# Patient Record
Sex: Male | Born: 1970 | Race: Black or African American | Hispanic: No | Marital: Married | Smoking: Never smoker
Health system: Southern US, Community
[De-identification: ages and names within clinical notes are randomized; demographics above are authoritative.]

## PROBLEM LIST (undated history)

## (undated) DIAGNOSIS — M1990 Unspecified osteoarthritis, unspecified site: Secondary | ICD-10-CM

## (undated) DIAGNOSIS — R42 Dizziness and giddiness: Secondary | ICD-10-CM

## (undated) DIAGNOSIS — E119 Type 2 diabetes mellitus without complications: Secondary | ICD-10-CM

## (undated) DIAGNOSIS — I10 Essential (primary) hypertension: Secondary | ICD-10-CM

## (undated) DIAGNOSIS — E1169 Type 2 diabetes mellitus with other specified complication: Secondary | ICD-10-CM

## (undated) DIAGNOSIS — M25519 Pain in unspecified shoulder: Secondary | ICD-10-CM

## (undated) DIAGNOSIS — E785 Hyperlipidemia, unspecified: Secondary | ICD-10-CM

## (undated) HISTORY — DX: Type 2 diabetes mellitus without complications: E11.9

## (undated) HISTORY — PX: LUMBAR DISC SURGERY: SHX700

## (undated) HISTORY — DX: Unspecified osteoarthritis, unspecified site: M19.90

## (undated) HISTORY — PX: EYE SURGERY: SHX253

## (undated) HISTORY — PX: LUMBAR FUSION: SHX111

## (undated) HISTORY — DX: Type 2 diabetes mellitus with other specified complication: E11.69

## (undated) HISTORY — DX: Pain in unspecified shoulder: M25.519

## (undated) HISTORY — DX: Essential (primary) hypertension: I10

## (undated) HISTORY — PX: KELOID EXCISION: SHX1856

## (undated) HISTORY — PX: APPENDECTOMY: SHX54

## (undated) HISTORY — PX: SPINAL CORD STIMULATOR IMPLANT: SHX2422

## (undated) HISTORY — DX: Type 2 diabetes mellitus with other specified complication: E78.5

## (undated) HISTORY — PX: KNEE ARTHROSCOPY: SUR90

---

## 2013-12-15 DIAGNOSIS — R059 Cough, unspecified: Secondary | ICD-10-CM | POA: Insufficient documentation

## 2014-04-08 DIAGNOSIS — M4316 Spondylolisthesis, lumbar region: Secondary | ICD-10-CM | POA: Insufficient documentation

## 2014-04-08 DIAGNOSIS — G894 Chronic pain syndrome: Secondary | ICD-10-CM | POA: Insufficient documentation

## 2017-03-01 LAB — BASIC METABOLIC PANEL
BUN: 20 (ref 4–21)
CO2: 18 (ref 13–22)
Chloride: 99 (ref 99–108)
Creatinine: 1.3 (ref 0.6–1.3)
Glucose: 104
Potassium: 4.8 (ref 3.4–5.3)
Sodium: 138 (ref 137–147)

## 2017-03-01 LAB — COMPREHENSIVE METABOLIC PANEL
Albumin: 4.9 (ref 3.5–5.0)
Calcium: 10.3 (ref 8.7–10.7)
GFR calc Af Amer: 75
GFR calc non Af Amer: 65
Globulin: 3

## 2017-03-01 LAB — CBC AND DIFFERENTIAL
HCT: 53 (ref 41–53)
Hemoglobin: 17.9 — AB (ref 13.5–17.5)
Platelets: 287 (ref 150–399)
WBC: 11.2

## 2017-03-01 LAB — TSH: TSH: 0.66 (ref 0.41–5.90)

## 2017-03-01 LAB — MICROALBUMIN, URINE: Microalb, Ur: <3

## 2017-03-01 LAB — HEPATIC FUNCTION PANEL
ALT: 25 (ref 10–40)
AST: 28 (ref 14–40)
Alkaline Phosphatase: 114 (ref 25–125)
Bilirubin, Total: 0.6

## 2017-03-01 LAB — CBC: RBC: 6.36 — AB (ref 3.87–5.11)

## 2018-11-28 LAB — HEPATIC FUNCTION PANEL
ALT: 28 (ref 10–40)
AST: 41 — AB (ref 14–40)
Alkaline Phosphatase: 96 (ref 25–125)

## 2018-11-28 LAB — COMPREHENSIVE METABOLIC PANEL
Albumin: 5.3 — AB (ref 3.5–5.0)
Calcium: 10.6 (ref 8.7–10.7)
GFR calc Af Amer: 67
GFR calc non Af Amer: 58
Globulin: 2.7

## 2018-11-28 LAB — BASIC METABOLIC PANEL
BUN: 21 (ref 4–21)
CO2: 19 (ref 13–22)
Chloride: 101 (ref 99–108)
Creatinine: 1.4 — AB (ref 0.6–1.3)
Glucose: 75
Potassium: 4.9 (ref 3.4–5.3)
Sodium: 138 (ref 137–147)

## 2018-11-28 LAB — LIPID PANEL
Cholesterol: 119 (ref 0–200)
HDL: 36 (ref 35–70)
LDL Cholesterol: 71
Triglycerides: 60 (ref 40–160)

## 2018-11-28 LAB — HEMOGLOBIN A1C: Hemoglobin A1C: 7.7

## 2019-02-04 NOTE — Progress Notes (Signed)
Patient: Timothy Douglas, Male    DOB: 1970-07-29, 49 y.o.   MRN: [id number] Visit Date: 02/05/2019  Today's Provider: Lavon Paganini, MD   Chief Complaint  Patient presents with  . New Patient (Initial Visit)   Subjective:    I Timothy Douglas, CMA, am acting as scribe for Lavon Paganini, MD.   Virtual Visit via Telephone Note  I connected with Timothy Douglas on 02/05/19 at  2:00 PM EST by telephone and verified that I am speaking with the correct person using two identifiers.  Location: Patient location: home Provider location: home office  Persons involved in the visit: patient, provider   I discussed the limitations, risks, security and privacy concerns of performing an evaluation and management service by telephone and the availability of in person appointments. I also discussed with the patient that there may be a patient responsible charge related to this service. The patient expressed understanding and agreed to proceed.   Establish Care Timothy Douglas is a 49 y.o. male who presents today to establish care.  Concerned that some of the medications that he has been taking for BP and T2DM have been "throwing the rest of my body off."  Reports that his sex drive is low and attributes this to the medication.  Within the last 6-7 months, having more struggle with ED.  Has difficulty getting and maintaining an erection. Does awaken with an erection at times.  Former PCP in [place] told him to wait and see. He wants to make sure that he is well listened to.  He also wants to make sure that his HCM is kept up-to-date.  He has never had a colonoscopy.  Also would love a "wonder drug" to stop joints from aching.  Thinks that age has contributed to aches and pains.  Was involved in a work related accident in his 34s.  He broke his back in 3 places.  He needed multiple surgeries.  Believes that not being able to stay active as he used to contributed to developing these other  medical problems.  Has spinal cord stimulator, but does not need it as much now that he has lost weight and is exercising again.  Taking meloxicam for L shoulder pain. Helps some, but felt like muscle relaxers helped more in the past.  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  Does still get occasional dizziness thought to be related to his former back injury. Takes meclizine rarely  HTN: - Medications: amlodipine, lisinopril - Compliance: good - Checking BP at home: not currently - Denies any SOB, CP, vision changes, LE edema, medication SEs, or symptoms of hypotension - Diet: low sodium - Exercise: Regularly  T2DM - Checking BG at home: No - Medications: Jardiance, janumet, glipizide - Compliance: good - Diet: Low-carb - Exercise: Regular - eye exam: Believes this was him within the last year-we will send ROI - foot exam: Needs at next in-person visit - microalbumin: On ACE inhibitor - denies symptoms of hypoglycemia, polyuria, polydipsia, numbness extremities, foot ulcers/trauma   HLD - medications: atorvastatin - compliance: good - medication SEs: none   Has gout flares intermittently.  Takes indomethacin prn  Knows not to take with other NSAIDs -----------------------------------------------------------------   Review of Systems  Constitutional: Negative.   HENT: Negative.   Eyes: Negative.   Respiratory: Negative.   Cardiovascular: Negative.   Gastrointestinal: Negative.   Endocrine: Negative.   Genitourinary: Negative.   Musculoskeletal: Negative.   Skin: Negative.  Allergic/Immunologic: Negative.   Neurological: Negative.   Hematological: Negative.   Psychiatric/Behavioral: Negative.     Social History      He  reports that he has never smoked. He has never used smokeless tobacco. He reports current alcohol use of about 2.0 - 3.0 standard drinks of alcohol per week. He reports that he does not use drugs.       Social History   Socioeconomic  History  . Marital status: Married    Spouse name: Timothy Douglas  . Number of children: 3  . Years of education: Not on file  . Highest education level: Not on file  Occupational History  . Not on file  Tobacco Use  . Smoking status: Never Smoker  . Smokeless tobacco: Never Used  Substance and Sexual Activity  . Alcohol use: Yes    Alcohol/week: 2.0 - 3.0 standard drinks    Types: 1 - 2 Glasses of wine, 1 Cans of beer per week  . Drug use: Never  . Sexual activity: Yes    Partners: Female    Birth control/protection: None  Other Topics Concern  . Not on file  Social History Narrative  . Not on file   Social Determinants of Health   Financial Resource Strain:   . Difficulty of Paying Living Expenses: Not on file  Food Insecurity:   . Worried About Charity fundraiser in the Last Year: Not on file  . Ran Out of Food in the Last Year: Not on file  Transportation Needs:   . Lack of Transportation (Medical): Not on file  . Lack of Transportation (Non-Medical): Not on file  Physical Activity:   . Days of Exercise per Week: Not on file  . Minutes of Exercise per Session: Not on file  Stress:   . Feeling of Stress : Not on file  Social Connections:   . Frequency of Communication with Friends and Family: Not on file  . Frequency of Social Gatherings with Friends and Family: Not on file  . Attends Religious Services: Not on file  . Active Member of Clubs or Organizations: Not on file  . Attends Archivist Meetings: Not on file  . Marital Status: Not on file    Past Medical History:  Diagnosis Date  . DJD (degenerative joint disease)   . Essential hypertension   . Hyperlipidemia due to type 2 diabetes mellitus ([place])   . Shoulder pain   . T2DM (type 2 diabetes mellitus) [facility])      Patient Active Problem List   Diagnosis Date Noted  . Family history of thyroid disease 02/05/2019  . Family history of leukemia 02/05/2019  . Decreased libido 02/05/2019  .  Erectile dysfunction 02/05/2019  . T2DM (type 2 diabetes mellitus) ([facility])   . Essential hypertension   . Hyperlipidemia due to type 2 diabetes mellitus ([place])   . Spondylolisthesis of lumbar region 04/08/2014    Past Surgical History:  Procedure Laterality Date  . APPENDECTOMY    . EYE SURGERY    . KELOID EXCISION  1990s  . KNEE ARTHROSCOPY Left   . LUMBAR DISC SURGERY    . LUMBAR FUSION     L5-S1  . SPINAL CORD STIMULATOR IMPLANT      Family History        Family Status  Relation Name Status  . Mother  (Not Specified)  . Father  (Not Specified)  . Sister  (Not Specified)  . Brother  (Not Specified)  .  MGM  (Not Specified)  . MGF  (Not Specified)  . PGM  (Not Specified)  . Mat Uncle  (Not Specified)  . Paternal GGF  (Not Specified)  . Neg Hx  (Not Specified)        His family history includes Bell's palsy in his mother; Congestive Heart Failure in his paternal great-grandfather; Diabetes in his maternal grandmother and paternal grandmother; Hypertension in his father and mother; Leukemia in his maternal grandfather and maternal uncle; Thyroid disease in his brother and sister. There is no history of Breast cancer, Prostate cancer, or Colon cancer.      Allergies  Allergen Reactions  . Tape      Current Outpatient Medications:  .  amLODipine (NORVASC) 10 MG tablet, Take 10 mg by mouth Timothy., Disp: , Rfl:  .  atorvastatin (LIPITOR) 40 MG tablet, Take 40 mg by mouth Timothy., Disp: , Rfl:  .  glipiZIDE (GLUCOTROL XL) 5 MG 24 hr tablet, Take 5 mg by mouth Timothy., Disp: , Rfl:  .  indomethacin (INDOCIN) 25 MG capsule, Take 25 mg by mouth 3 (three) times Timothy as needed., Disp: , Rfl:  .  JANUMET XR 50-1000 MG TB24, Take 2 tablets by mouth Timothy., Disp: , Rfl:  .  JARDIANCE 25 MG TABS tablet, Take 25 mg by mouth Timothy., Disp: , Rfl:  .  lisinopril (ZESTRIL) 40 MG tablet, Take 40 mg by mouth Timothy., Disp: , Rfl:  .  meclizine (ANTIVERT) 25 MG tablet, Take 25 mg by mouth every  6 (six) hours as needed., Disp: , Rfl:  .  meloxicam (MOBIC) 15 MG tablet, Take 15 mg by mouth Timothy., Disp: , Rfl:  .  sildenafil (VIAGRA) 100 MG tablet, Take 0.5-1 tablets (50-100 mg total) by mouth Timothy as needed for erectile dysfunction., Disp: 15 tablet, Rfl: 11   Patient Care Team: Virginia Crews, MD as PCP - General (Family Medicine)    Objective:    Vitals: There were no vitals taken for this visit.  There were no vitals filed for this visit.   Physical Exam Speaking in full sentences in NAD  Depression Screen No flowsheet data found.     Assessment & Plan:     Establish Care  Exercise Activities and Dietary recommendations Goals   None     Immunization History  Administered Date(s) Administered  . Influenza,inj,Quad PF,6+ Mos 11/02/2018    Health Maintenance  Topic Date Due  . HEMOGLOBIN A1C  1970-05-07  . PNEUMOCOCCAL POLYSACCHARIDE VACCINE AGE 55-64 HIGH RISK  02/24/1970  . FOOT EXAM  02/24/1978  . OPHTHALMOLOGY EXAM  02/24/1978  . HIV Screening  02/25/1983  . TETANUS/TDAP  02/25/1987  . COLONOSCOPY  02/24/2018  . INFLUENZA VACCINE  Completed     Discussed health benefits of physical activity, and encouraged him to engage in regular exercise appropriate for his age and condition.    --------------------------------------------------------------------  Problem List Items Addressed This Visit      Cardiovascular and Mediastinum   Essential hypertension    Reportedly previously well controlled Continue current medications Check metabolic panel Discussed low-sodium diet and exercise Follow-up at CPE in 3 months      Relevant Medications   amLODipine (NORVASC) 10 MG tablet   atorvastatin (LIPITOR) 40 MG tablet   lisinopril (ZESTRIL) 40 MG tablet   sildenafil (VIAGRA) 100 MG tablet   Other Relevant Orders   CMP (Comprehensive metabolic panel)     Endocrine   T2DM (type 2 diabetes mellitus) (  [place]) - Primary    Previous control  unknown Associated with/complicated by HTN and HLD We will check A1c Continue Janumet, glipizide, Jardiance at current doses We discussed that we could consider tapering back on medications pending his A1c, but that it is common for people need multiple diabetic medications We discussed low-carb diet and exercise Needs foot exam at next in-person visit We will send ROI to previous PCP for last vaccinations We will send ROI to ophthalmology for last diabetic eye exam On statin and ACE inhibitor Follow-up in 3 months      Relevant Medications   JANUMET XR 50-1000 MG TB24   atorvastatin (LIPITOR) 40 MG tablet   JARDIANCE 25 MG TABS tablet   glipiZIDE (GLUCOTROL XL) 5 MG 24 hr tablet   lisinopril (ZESTRIL) 40 MG tablet   Other Relevant Orders   Hemoglobin A1c   Hyperlipidemia due to type 2 diabetes mellitus (H[facility])    Chronic Previous control unknown Check FLP and CMP Continue atorvastatin Goal LDL less than 70 ROI to previous PCP      Relevant Medications   JANUMET XR 50-1000 MG TB24   amLODipine (NORVASC) 10 MG tablet   atorvastatin (LIPITOR) 40 MG tablet   JARDIANCE 25 MG TABS tablet   glipiZIDE (GLUCOTROL XL) 5 MG 24 hr tablet   lisinopril (ZESTRIL) 40 MG tablet   sildenafil (VIAGRA) 100 MG tablet   Other Relevant Orders   Lipid panel   CMP (Comprehensive metabolic panel)     Musculoskeletal and Integument   Spondylolisthesis of lumbar region    Status post multiple back surgeries with recurrent back pain Continue meloxicam as needed Could consider muscle relaxer as needed Also has spinal stimulator in place        Other   Family history of thyroid disease    Patient reports that both his brother and sister have thyroid disease He is asymptomatic Screening TSH      Relevant Orders   TSH   Family history of leukemia    Patient has 2 members of his maternal family with history of leukemia Check annual CBC      Relevant Orders   CBC w/Diff/Platelet    Decreased libido    Longstanding issue He believes it may be related to some of his diabetes and hypertension medications We discussed that ED is common with diabetes and hypertension as these are all vascular issues It is unlikely that any one of his medications is directly causing this We did discuss that we will try to minimize medications as able depending on control that we find from labs and blood pressure at next visit Trial of Viagra as needed      Erectile dysfunction    Longstanding issue He believes it may be related to some of his diabetes and hypertension medications We discussed that ED is common with diabetes and hypertension as these are all vascular issues It is unlikely that any one of his medications is directly causing this We did discuss that we will try to minimize medications as able depending on control that we find from labs and blood pressure at next visit Trial of Viagra as needed       Other Visit Diagnoses    Screen for colon cancer       Relevant Orders   Ambulatory referral to Gastroenterology       Return in about 3 months (around 05/06/2019) for CPE.  Total time spent on today's visit was greater than 50  minutes, including both face-to-face time and nonface-to-face time personally spent on review of chart (labs and imaging), discussing labs and goals, discussing further work-up, treatment options, referrals to specialist if needed, reviewing outside records of pertinent, answering patient's questions, and coordinating care.    The entirety of the information documented in the History of Present Illness, Review of Systems and Physical Exam were personally obtained by me. Portions of this information were initially documented by Lynford Humphrey, CMA and reviewed by me for thoroughness and accuracy.    Naama Sappington, Dionne Bucy, MD MPH [facility]

## 2019-02-05 ENCOUNTER — Encounter: Payer: Self-pay | Admitting: Family Medicine

## 2019-02-05 ENCOUNTER — Ambulatory Visit (INDEPENDENT_AMBULATORY_CARE_PROVIDER_SITE_OTHER): Payer: 59 | Admitting: Family Medicine

## 2019-02-05 DIAGNOSIS — E1169 Type 2 diabetes mellitus with other specified complication: Secondary | ICD-10-CM | POA: Diagnosis not present

## 2019-02-05 DIAGNOSIS — M4316 Spondylolisthesis, lumbar region: Secondary | ICD-10-CM

## 2019-02-05 DIAGNOSIS — I10 Essential (primary) hypertension: Secondary | ICD-10-CM

## 2019-02-05 DIAGNOSIS — I152 Hypertension secondary to endocrine disorders: Secondary | ICD-10-CM | POA: Insufficient documentation

## 2019-02-05 DIAGNOSIS — Z8349 Family history of other endocrine, nutritional and metabolic diseases: Secondary | ICD-10-CM | POA: Diagnosis not present

## 2019-02-05 DIAGNOSIS — Z806 Family history of leukemia: Secondary | ICD-10-CM

## 2019-02-05 DIAGNOSIS — Z1211 Encounter for screening for malignant neoplasm of colon: Secondary | ICD-10-CM

## 2019-02-05 DIAGNOSIS — E785 Hyperlipidemia, unspecified: Secondary | ICD-10-CM | POA: Insufficient documentation

## 2019-02-05 DIAGNOSIS — E1159 Type 2 diabetes mellitus with other circulatory complications: Secondary | ICD-10-CM | POA: Insufficient documentation

## 2019-02-05 DIAGNOSIS — N529 Male erectile dysfunction, unspecified: Secondary | ICD-10-CM

## 2019-02-05 DIAGNOSIS — E119 Type 2 diabetes mellitus without complications: Secondary | ICD-10-CM | POA: Insufficient documentation

## 2019-02-05 DIAGNOSIS — R6882 Decreased libido: Secondary | ICD-10-CM | POA: Insufficient documentation

## 2019-02-05 MED ORDER — SILDENAFIL CITRATE 100 MG PO TABS: 50.0000 mg | ORAL_TABLET | Freq: Every day | ORAL | 11 refills | Status: DC | PRN

## 2019-02-05 NOTE — Assessment & Plan Note (Signed)
Status post multiple back surgeries with recurrent back pain Continue meloxicam as needed Could consider muscle relaxer as needed Also has spinal stimulator in place

## 2019-02-05 NOTE — Assessment & Plan Note (Signed)
Chronic Previous control unknown Check FLP and CMP Continue atorvastatin Goal LDL less than 70 ROI to previous PCP

## 2019-02-05 NOTE — Assessment & Plan Note (Signed)
Previous control unknown Associated with/complicated by HTN and HLD We will check A1c Continue Janumet, glipizide, Jardiance at current doses We discussed that we could consider tapering back on medications pending his A1c, but that it is common for people need multiple diabetic medications We discussed low-carb diet and exercise Needs foot exam at next in-person visit We will send ROI to previous PCP for last vaccinations We will send ROI to ophthalmology for last diabetic eye exam On statin and ACE inhibitor Follow-up in 3 months

## 2019-02-05 NOTE — Assessment & Plan Note (Signed)
Patient has 2 members of his maternal family with history of leukemia Check annual CBC

## 2019-02-05 NOTE — Assessment & Plan Note (Signed)
Longstanding issue He believes it may be related to some of his diabetes and hypertension medications We discussed that ED is common with diabetes and hypertension as these are all vascular issues It is unlikely that any one of his medications is directly causing this We did discuss that we will try to minimize medications as able depending on control that we find from labs and blood pressure at next visit Trial of Viagra as needed

## 2019-02-05 NOTE — Assessment & Plan Note (Signed)
Reportedly previously well controlled Continue current medications Check metabolic panel Discussed low-sodium diet and exercise Follow-up at CPE in 3 months

## 2019-02-05 NOTE — Assessment & Plan Note (Signed)
Patient reports that both his brother and sister have thyroid disease He is asymptomatic Screening TSH

## 2019-02-15 ENCOUNTER — Encounter: Payer: Self-pay | Admitting: *Deleted

## 2019-02-17 ENCOUNTER — Other Ambulatory Visit: Payer: Self-pay

## 2019-02-17 ENCOUNTER — Telehealth: Payer: Self-pay

## 2019-02-17 DIAGNOSIS — Z1211 Encounter for screening for malignant neoplasm of colon: Secondary | ICD-10-CM

## 2019-02-17 NOTE — Telephone Encounter (Signed)
Gastroenterology Pre-Procedure Review  Request Date: Friday 03/19/19 Requesting Physician: Dr. Allen Norris  PATIENT REVIEW QUESTIONS: The patient responded to the following health history questions as indicated:    1. Are you having any GI issues? no 2. Do you have a personal history of Polyps? no 3. Do you have a family history of Colon Cancer or Polyps? no 4. Diabetes Mellitus?yes 5. Joint replacements in the past 12 months?no 6. Major health problems in the past 3 months?no 7. Any artificial heart valves, MVP, or defibrillator?no    MEDICATIONS & ALLERGIES:    Patient reports the following regarding taking any anticoagulation/antiplatelet therapy:   Plavix, Coumadin, Eliquis, Xarelto, Lovenox, Pradaxa, Brilinta, or Effient? no Aspirin? no  Patient confirms/reports the following medications:  Current Outpatient Medications  Medication Sig Dispense Refill  . amLODipine (NORVASC) 10 MG tablet Take 10 mg by mouth daily.    Marland Kitchen atorvastatin (LIPITOR) 40 MG tablet Take 40 mg by mouth daily.    Marland Kitchen glipiZIDE (GLUCOTROL XL) 5 MG 24 hr tablet Take 5 mg by mouth daily.    . indomethacin (INDOCIN) 25 MG capsule Take 25 mg by mouth 3 (three) times daily as needed.    Marland Kitchen JANUMET XR 50-1000 MG TB24 Take 2 tablets by mouth daily.    Marland Kitchen JARDIANCE 25 MG TABS tablet Take 25 mg by mouth daily.    Marland Kitchen lisinopril (ZESTRIL) 40 MG tablet Take 40 mg by mouth daily.    . meclizine (ANTIVERT) 25 MG tablet Take 25 mg by mouth every 6 (six) hours as needed.    . meloxicam (MOBIC) 15 MG tablet Take 15 mg by mouth daily.    . sildenafil (VIAGRA) 100 MG tablet Take 0.5-1 tablets (50-100 mg total) by mouth daily as needed for erectile dysfunction. 15 tablet 11   No current facility-administered medications for this visit.    Patient confirms/reports the following allergies:  Allergies  Allergen Reactions  . Tape     No orders of the defined types were placed in this encounter.   AUTHORIZATION INFORMATION Primary  Insurance: 1D#: Group #:  Secondary Insurance: 1D#: Group #:  SCHEDULE INFORMATION: Date: 03/19/19 Time: Location:[facility]

## 2019-03-15 ENCOUNTER — Encounter: Payer: Self-pay | Admitting: Gastroenterology

## 2019-03-15 ENCOUNTER — Other Ambulatory Visit: Payer: Self-pay

## 2019-03-17 ENCOUNTER — Other Ambulatory Visit: Payer: Self-pay

## 2019-03-17 ENCOUNTER — Other Ambulatory Visit
Admission: RE | Admit: 2019-03-17 | Discharge: 2019-03-17 | Disposition: A | Discharge status: Home / Self Care | Payer: 59 | Source: Ambulatory Visit | Attending: Gastroenterology | Admitting: Gastroenterology

## 2019-03-17 DIAGNOSIS — Z20822 Contact with and (suspected) exposure to covid-19: Secondary | ICD-10-CM | POA: Diagnosis not present

## 2019-03-17 DIAGNOSIS — Z01812 Encounter for preprocedural laboratory examination: Secondary | ICD-10-CM | POA: Diagnosis not present

## 2019-03-17 LAB — SARS CORONAVIRUS 2 (TAT 6-24 HRS): SARS Coronavirus 2: NEGATIVE

## 2019-03-17 NOTE — Discharge Instructions (Signed)
General Anesthesia, Adult, Care After This sheet gives you information about how to care for yourself after your procedure. Your health care provider may also give you more specific instructions. If you have problems or questions, contact your health care provider. What can I expect after the procedure? After the procedure, the following side effects are common:  Pain or discomfort at the IV site.  Nausea.  Vomiting.  Sore throat.  Trouble concentrating.  Feeling cold or chills.  Weak or tired.  Sleepiness and fatigue.  Soreness and body aches. These side effects can affect parts of the body that were not involved in surgery. Follow these instructions at home:  For at least 24 hours after the procedure:  Have a responsible adult stay with you. It is important to have someone help care for you until you are awake and alert.  Rest as needed.  Do not: ? Participate in activities in which you could fall or become injured. ? Drive. ? Use heavy machinery. ? Drink alcohol. ? Take sleeping pills or medicines that cause drowsiness. ? Make important decisions or sign legal documents. ? Take care of children on your own. Eating and drinking  Follow any instructions from your health care provider about eating or drinking restrictions.  When you feel hungry, start by eating small amounts of foods that are soft and easy to digest (bland), such as toast. Gradually return to your regular diet.  Drink enough fluid to keep your urine pale yellow.  If you vomit, rehydrate by drinking water, juice, or clear broth. General instructions  If you have sleep apnea, surgery and certain medicines can increase your risk for breathing problems. Follow instructions from your health care provider about wearing your sleep device: ? Anytime you are sleeping, including during daytime naps. ? While taking prescription pain medicines, sleeping medicines, or medicines that make you drowsy.  Return to  your normal activities as told by your health care provider. Ask your health care provider what activities are safe for you.  Take over-the-counter and prescription medicines only as told by your health care provider.  If you smoke, do not smoke without supervision.  Keep all follow-up visits as told by your health care provider. This is important. Contact a health care provider if:  You have nausea or vomiting that does not get better with medicine.  You cannot eat or drink without vomiting.  You have pain that does not get better with medicine.  You are unable to pass urine.  You develop a skin rash.  You have a fever.  You have redness around your IV site that gets worse. Get help right away if:  You have difficulty breathing.  You have chest pain.  You have blood in your urine or stool, or you vomit blood. Summary  After the procedure, it is common to have a sore throat or nausea. It is also common to feel tired.  Have a responsible adult stay with you for the first 24 hours after general anesthesia. It is important to have someone help care for you until you are awake and alert.  When you feel hungry, start by eating small amounts of foods that are soft and easy to digest (bland), such as toast. Gradually return to your regular diet.  Drink enough fluid to keep your urine pale yellow.  Return to your normal activities as told by your health care provider. Ask your health care provider what activities are safe for you. This information is not   intended to replace advice given to you by your health care provider. Make sure you discuss any questions you have with your health care provider. Document Revised: 01/17/2017 Document Reviewed: 08/30/2016 Elsevier Patient Education  2020 [employer]  

## 2019-03-19 ENCOUNTER — Ambulatory Visit: Payer: No Typology Code available for payment source | Admitting: Anesthesiology

## 2019-03-19 ENCOUNTER — Other Ambulatory Visit: Payer: Self-pay

## 2019-03-19 ENCOUNTER — Encounter
Admission: RE | Disposition: A | Discharge status: Home / Self Care | Payer: Self-pay | Source: Home / Self Care | Attending: Gastroenterology

## 2019-03-19 ENCOUNTER — Encounter: Payer: Self-pay | Admitting: Gastroenterology

## 2019-03-19 ENCOUNTER — Ambulatory Visit
Admission: RE | Admit: 2019-03-19 | Discharge: 2019-03-19 | Disposition: A | Discharge status: Home / Self Care | Payer: No Typology Code available for payment source | Attending: Gastroenterology | Admitting: Gastroenterology

## 2019-03-19 DIAGNOSIS — K635 Polyp of colon: Secondary | ICD-10-CM | POA: Diagnosis not present

## 2019-03-19 DIAGNOSIS — Z791 Long term (current) use of non-steroidal anti-inflammatories (NSAID): Secondary | ICD-10-CM | POA: Diagnosis not present

## 2019-03-19 DIAGNOSIS — Z79899 Other long term (current) drug therapy: Secondary | ICD-10-CM | POA: Diagnosis not present

## 2019-03-19 DIAGNOSIS — E1169 Type 2 diabetes mellitus with other specified complication: Secondary | ICD-10-CM | POA: Insufficient documentation

## 2019-03-19 DIAGNOSIS — Z7984 Long term (current) use of oral hypoglycemic drugs: Secondary | ICD-10-CM | POA: Insufficient documentation

## 2019-03-19 DIAGNOSIS — I10 Essential (primary) hypertension: Secondary | ICD-10-CM | POA: Insufficient documentation

## 2019-03-19 DIAGNOSIS — K6389 Other specified diseases of intestine: Secondary | ICD-10-CM | POA: Insufficient documentation

## 2019-03-19 DIAGNOSIS — E785 Hyperlipidemia, unspecified: Secondary | ICD-10-CM | POA: Insufficient documentation

## 2019-03-19 DIAGNOSIS — D123 Benign neoplasm of transverse colon: Secondary | ICD-10-CM | POA: Insufficient documentation

## 2019-03-19 DIAGNOSIS — Z1211 Encounter for screening for malignant neoplasm of colon: Secondary | ICD-10-CM | POA: Diagnosis present

## 2019-03-19 HISTORY — DX: Dizziness and giddiness: R42

## 2019-03-19 HISTORY — PX: POLYPECTOMY: SHX5525

## 2019-03-19 HISTORY — PX: COLONOSCOPY WITH PROPOFOL: SHX5780

## 2019-03-19 LAB — GLUCOSE, CAPILLARY: Glucose-Capillary: 124 mg/dL — ABNORMAL HIGH (ref 70–99)

## 2019-03-19 SURGERY — COLONOSCOPY WITH PROPOFOL
Anesthesia: General | Site: Rectum

## 2019-03-19 MED ORDER — STERILE WATER FOR IRRIGATION IR SOLN
Status: DC | PRN
  Administered 2019-03-19: 100 mL

## 2019-03-19 MED ORDER — LACTATED RINGERS IV SOLN
100.0000 mL/h | INTRAVENOUS | Status: DC
  Administered 2019-03-19: 100 mL/h via INTRAVENOUS

## 2019-03-19 MED ORDER — ACETAMINOPHEN 10 MG/ML IV SOLN: 1000.0000 mg | Freq: Once | INTRAVENOUS | Status: DC | PRN

## 2019-03-19 MED ORDER — LIDOCAINE HCL (CARDIAC) PF 100 MG/5ML IV SOSY
PREFILLED_SYRINGE | INTRAVENOUS | Status: DC | PRN
  Administered 2019-03-19: 50 mg via INTRAVENOUS

## 2019-03-19 MED ORDER — PROPOFOL 10 MG/ML IV BOLUS
INTRAVENOUS | Status: DC | PRN
  Administered 2019-03-19: 100 mg via INTRAVENOUS
  Administered 2019-03-19 (×3): 50 mg via INTRAVENOUS

## 2019-03-19 MED ORDER — ONDANSETRON HCL 4 MG/2ML IJ SOLN: 4.0000 mg | Freq: Once | INTRAMUSCULAR | Status: DC | PRN

## 2019-03-19 SURGICAL SUPPLY — 8 items
CANISTER SUCT 1200ML W/VALVE (MISCELLANEOUS) ×2 IMPLANT
FORCEPS BIOP RAD 4 LRG CAP 4 (CUTTING FORCEPS) ×2 IMPLANT
GOWN CVR UNV OPN BCK APRN NK (MISCELLANEOUS) ×2 IMPLANT
GOWN ISOL THUMB LOOP REG UNIV (MISCELLANEOUS) ×2
KIT ENDO PROCEDURE OLY (KITS) ×2 IMPLANT
SNARE SHORT THROW 13M SML OVAL (MISCELLANEOUS) ×2 IMPLANT
TRAP ETRAP POLY (MISCELLANEOUS) ×2 IMPLANT
WATER STERILE IRR 250ML POUR (IV SOLUTION) ×2 IMPLANT

## 2019-03-19 NOTE — Transfer of Care (Signed)
Immediate Anesthesia Transfer of Care Note  Patient: Timothy Douglas  Procedure(s) Performed: COLONOSCOPY WITH BIOPSY (N/A Rectum) POLYPECTOMY (N/A Rectum)  Patient Location: PACU  Anesthesia Type: General  Level of Consciousness: awake, alert  and patient cooperative  Airway and Oxygen Therapy: Patient Spontanous Breathing and Patient connected to supplemental oxygen  Post-op Assessment: Post-op Vital signs reviewed, Patient's Cardiovascular Status Stable, Respiratory Function Stable, Patent Airway and No signs of Nausea or vomiting  Post-op Vital Signs: Reviewed and stable  Complications: No apparent anesthesia complications

## 2019-03-19 NOTE — Anesthesia Procedure Notes (Signed)
Procedure Name: General with mask airway Date/Time: 03/19/2019 11:05 AM Performed by: Jeannene Patella, CRNA Pre-anesthesia Checklist: Patient identified, Emergency Drugs available, Suction available, Patient being monitored and Timeout performed Patient Re-evaluated:Patient Re-evaluated prior to induction Oxygen Delivery Method: Nasal cannula

## 2019-03-19 NOTE — Op Note (Signed)
[facility] Patient Name: Timothy Douglas Procedure Date: 03/19/2019 10:55 AM MRN: [id number] Account #: [id number] Date of Birth: 22-Jun-1970 Admit Type: Outpatient Age: 49 Room: [facility] OR ROOM 01 Gender: Male Note Status: Finalized Procedure:             Colonoscopy Indications:           Screening for colorectal malignant neoplasm Providers:             Lucilla Lame MD, MD Referring MD:          [name] (Referring MD) Medicines:             Propofol per Anesthesia Complications:         No immediate complications. Procedure:             Pre-Anesthesia Assessment:                        - Prior to the procedure, a History and Physical was                         performed, and patient medications and allergies were                         reviewed. The patient's tolerance of previous                         anesthesia was also reviewed. The risks and benefits                         of the procedure and the sedation options and risks                         were discussed with the patient. All questions were                         answered, and informed consent was obtained. Prior                         Anticoagulants: The patient has taken no previous                         anticoagulant or antiplatelet agents. ASA Grade                         Assessment: II - A patient with mild systemic disease.                         After reviewing the risks and benefits, the patient                         was deemed in satisfactory condition to undergo the                         procedure.                        After obtaining informed consent, the colonoscope was  passed under direct vision. Throughout the procedure,                         the patient's blood pressure, pulse, and oxygen                         saturations were monitored continuously. The was                         introduced through the anus and  advanced to the the                         cecum, identified by appendiceal orifice and ileocecal                         valve. The colonoscopy was performed without                         difficulty. The patient tolerated the procedure well.                         The quality of the bowel preparation was fair. Findings:      The perianal and digital rectal examinations were normal.      Three sessile polyps were found in the transverse colon. The polyps were       7 to 15 mm in size. These polyps were removed with a cold snare.       Resection and retrieval were complete.      A localized area of mildly nodular mucosa was found in the transverse       colon. Biopsies were taken with a cold forceps for histology. Impression:            - Preparation of the colon was fair.                        - Three 7 to 15 mm polyps in the transverse colon,                         removed with a cold snare. Resected and retrieved.                        - Nodular mucosa in the transverse colon. Biopsied. Recommendation:        - Discharge patient to home.                        - Resume previous diet.                        - Continue present medications.                        - Await pathology results.                        - Repeat colonoscopy in 3 years if polyp adenoma and                         10 years if hyperplastic Procedure Code(s):     --- Professional ---  45385, Colonoscopy, flexible; with removal of                         tumor(s), polyp(s), or other lesion(s) by snare                         technique                        45380, 59, Colonoscopy, flexible; with biopsy, single                         or multiple Diagnosis Code(s):     --- Professional ---                        Z12.11, Encounter for screening for malignant neoplasm                         of colon                        K63.5, Polyp of colon CPT copyright 2019 American Medical  Association. All rights reserved. The codes documented in this report are preliminary and upon coder review may  be revised to meet current compliance requirements. Lucilla Lame MD, MD 03/19/2019 11:21:58 AM This report has been signed electronically. Number of Addenda: 0 Note Initiated On: 03/19/2019 10:55 AM Scope Withdrawal Time: 0 hours 11 minutes 23 seconds  Total Procedure Duration: 0 hours 18 minutes 4 seconds  Estimated Blood Loss:  Estimated blood loss: none.      [facility]

## 2019-03-19 NOTE — H&P (Signed)
Lucilla Lame, MD [address] [place], [place] [address] Phone: [phone] Fax : [phone]  Primary Care Physician:  Virginia Crews, MD Primary Gastroenterologist:  Dr. Allen Norris  Pre-Procedure History & Physical: HPI:  Timothy Douglas is a 49 y.o. male is here for a screening colonoscopy.   Past Medical History:  Diagnosis Date  . DJD (degenerative joint disease)   . Essential hypertension   . Hyperlipidemia due to type 2 diabetes mellitus ([place])   . Shoulder pain   . T2DM (type 2 diabetes mellitus) ([place])   . Vertigo    1 episode over 10 yrs ago    Past Surgical History:  Procedure Laterality Date  . APPENDECTOMY    . EYE SURGERY    . KELOID EXCISION  1990s  . KNEE ARTHROSCOPY Left   . LUMBAR DISC SURGERY    . LUMBAR FUSION     L5-S1  . SPINAL CORD STIMULATOR IMPLANT      Prior to Admission medications   Medication Sig Start Date End Date Taking? Authorizing Provider  amLODipine (NORVASC) 10 MG tablet Take 10 mg by mouth daily. 11/28/18  Yes [provider]  Ascorbic Acid (VITAMIN C PO) Take by mouth daily.   Yes [provider]  atorvastatin (LIPITOR) 40 MG tablet Take 40 mg by mouth daily. 11/28/18  Yes [provider]  glipiZIDE (GLUCOTROL XL) 5 MG 24 hr tablet Take 5 mg by mouth daily. 11/28/18  Yes [provider]  indomethacin (INDOCIN) 25 MG capsule Take 25 mg by mouth 3 (three) times daily as needed. 01/11/19  Yes [provider]  JANUMET XR 50-1000 MG TB24 Take 2 tablets by mouth daily. 12/01/18  Yes [provider]  JARDIANCE 25 MG TABS tablet Take 25 mg by mouth daily. 11/29/18  Yes [provider]  lisinopril (ZESTRIL) 40 MG tablet Take 40 mg by mouth daily. 11/28/18  Yes [provider]  meclizine (ANTIVERT) 25 MG tablet Take 25 mg by mouth every 6 (six) hours as needed. 11/28/18  Yes [provider]  Multiple Vitamin (MULTIVITAMIN) tablet Take 1 tablet by  mouth daily.   Yes [provider]  meloxicam (MOBIC) 15 MG tablet Take 15 mg by mouth daily. 12/25/18   [provider]  sildenafil (VIAGRA) 100 MG tablet Take 0.5-1 tablets (50-100 mg total) by mouth daily as needed for erectile dysfunction. Patient not taking: Reported on 03/15/2019 02/05/19   Virginia Crews, MD    Allergies as of 02/17/2019 - Review Complete 02/05/2019  Allergen Reaction Noted  . Tape  02/05/2019    Family History  Problem Relation Age of Onset  . Hypertension Mother   . Bell's palsy Mother   . Hypertension Father   . Thyroid disease Sister   . Thyroid disease Brother   . Diabetes Maternal Grandmother   . Leukemia Maternal Grandfather   . Diabetes Paternal Grandmother   . Leukemia Maternal Uncle   . Congestive Heart Failure Paternal Great-grandfather   . Breast cancer Neg Hx   . Prostate cancer Neg Hx   . Colon cancer Neg Hx     Social History   Socioeconomic History  . Marital status: Married    Spouse name: Alfons Ramamurthy  . Number of children: 3  . Years of education: Not on file  . Highest education level: Not on file  Occupational History  . Not on file  Tobacco Use  . Smoking status: Never Smoker  .  Smokeless tobacco: Never Used  Substance and Sexual Activity  . Alcohol use: Yes    Alcohol/week: 2.0 - 3.0 standard drinks    Types: 1 - 2 Glasses of wine, 1 Cans of beer per week  . Drug use: Never  . Sexual activity: Yes    Partners: Female    Birth control/protection: None  Other Topics Concern  . Not on file  Social History Narrative  . Not on file   Social Determinants of Health   Financial Resource Strain:   . Difficulty of Paying Living Expenses: Not on file  Food Insecurity:   . Worried About Charity fundraiser in the Last Year: Not on file  . Ran Out of Food in the Last Year: Not on file  Transportation Needs:   . Lack of Transportation (Medical): Not on file  . Lack of Transportation (Non-Medical): Not  on file  Physical Activity:   . Days of Exercise per Week: Not on file  . Minutes of Exercise per Session: Not on file  Stress:   . Feeling of Stress : Not on file  Social Connections:   . Frequency of Communication with Friends and Family: Not on file  . Frequency of Social Gatherings with Friends and Family: Not on file  . Attends Religious Services: Not on file  . Active Member of Clubs or Organizations: Not on file  . Attends Archivist Meetings: Not on file  . Marital Status: Not on file  Intimate Partner Violence:   . Fear of Current or Ex-Partner: Not on file  . Emotionally Abused: Not on file  . Physically Abused: Not on file  . Sexually Abused: Not on file    Review of Systems: See HPI, otherwise negative ROS  Physical Exam: BP 125/90   Pulse 86   Temp 97.9 F (36.6 C) (Temporal)   Resp 16   Ht 6\' 3"  (1.905 m)   Wt 107 kg   SpO2 99%   BMI 29.50 kg/m  General:   Alert,  pleasant and cooperative in NAD Head:  Normocephalic and atraumatic. Neck:  Supple; no masses or thyromegaly. Lungs:  Clear throughout to auscultation.    Heart:  Regular rate and rhythm. Abdomen:  Soft, nontender and nondistended. Normal bowel sounds, without guarding, and without rebound.   Neurologic:  Alert and  oriented x4;  grossly normal neurologically.  Impression/Plan: Timothy Douglas is now here to undergo a screening colonoscopy.  Risks, benefits, and alternatives regarding colonoscopy have been reviewed with the patient.  Questions have been answered.  All parties agreeable.

## 2019-03-19 NOTE — Anesthesia Postprocedure Evaluation (Signed)
Anesthesia Post Note  Patient: Timothy Douglas  Procedure(s) Performed: COLONOSCOPY WITH BIOPSY (N/A Rectum) POLYPECTOMY (N/A Rectum)     Patient location during evaluation: PACU Anesthesia Type: General Level of consciousness: awake and alert Pain management: pain level controlled Vital Signs Assessment: post-procedure vital signs reviewed and stable Respiratory status: spontaneous breathing, nonlabored ventilation, respiratory function stable and patient connected to nasal cannula oxygen Cardiovascular status: blood pressure returned to baseline and stable Postop Assessment: no apparent nausea or vomiting Anesthetic complications: no    Tommy Goostree A  Briea Mcenery

## 2019-03-19 NOTE — Anesthesia Preprocedure Evaluation (Addendum)
Anesthesia Evaluation  Patient identified by MRN, date of birth, ID band Patient awake    Reviewed: Allergy & Precautions, NPO status , Patient's Chart, lab work & pertinent test results  History of Anesthesia Complications Negative for: history of anesthetic complications  Airway Mallampati: II  TM Distance: >3 FB Neck ROM: Full    Dental   Pulmonary    breath sounds clear to auscultation       Cardiovascular hypertension, (-) angina(-) DOE  Rhythm:Regular Rate:Normal   HLD   Neuro/Psych  Neuromuscular disease (Lumbar spondylolithesis)    GI/Hepatic neg GERD  ,  Endo/Other  diabetes, Type 2  Renal/GU      Musculoskeletal   Abdominal (+) + obese (BMI 30),   Peds  Hematology   Anesthesia Other Findings   Reproductive/Obstetrics                            Anesthesia Physical Anesthesia Plan  ASA: II  Anesthesia Plan: General   Post-op Pain Management:    Induction: Intravenous  PONV Risk Score and Plan: 2 and Propofol infusion, TIVA and Treatment may vary due to age or medical condition  Airway Management Planned: Natural Airway and Nasal Cannula  Additional Equipment:   Intra-op Plan:   Post-operative Plan:   Informed Consent: I have reviewed the patients History and Physical, chart, labs and discussed the procedure including the risks, benefits and alternatives for the proposed anesthesia with the patient or authorized representative who has indicated his/her understanding and acceptance.       Plan Discussed with: CRNA and Anesthesiologist  Anesthesia Plan Comments:         Anesthesia Quick Evaluation

## 2019-03-22 ENCOUNTER — Encounter: Payer: Self-pay | Admitting: *Deleted

## 2019-03-23 ENCOUNTER — Telehealth: Payer: Self-pay

## 2019-03-23 DIAGNOSIS — E785 Hyperlipidemia, unspecified: Secondary | ICD-10-CM

## 2019-03-23 DIAGNOSIS — E1169 Type 2 diabetes mellitus with other specified complication: Secondary | ICD-10-CM

## 2019-03-23 LAB — CBC WITH DIFFERENTIAL/PLATELET
Basophils Absolute: 0.1 10*3/uL (ref 0.0–0.2)
Basos: 1 %
EOS (ABSOLUTE): 0.1 10*3/uL (ref 0.0–0.4)
Eos: 2 %
Hematocrit: 53.7 % — ABNORMAL HIGH (ref 37.5–51.0)
Hemoglobin: 17.9 g/dL — ABNORMAL HIGH (ref 13.0–17.7)
Immature Grans (Abs): 0 10*3/uL (ref 0.0–0.1)
Immature Granulocytes: 0 %
Lymphocytes Absolute: 2.9 10*3/uL (ref 0.7–3.1)
Lymphs: 39 %
MCH: 28.7 pg (ref 26.6–33.0)
MCHC: 33.3 g/dL (ref 31.5–35.7)
MCV: 86 fL (ref 79–97)
Monocytes Absolute: 0.5 10*3/uL (ref 0.1–0.9)
Monocytes: 7 %
Neutrophils Absolute: 3.8 10*3/uL (ref 1.4–7.0)
Neutrophils: 51 %
Platelets: 231 10*3/uL (ref 150–450)
RBC: 6.23 x10E6/uL — ABNORMAL HIGH (ref 4.14–5.80)
RDW: 16.7 % — ABNORMAL HIGH (ref 11.6–15.4)
WBC: 7.4 10*3/uL (ref 3.4–10.8)

## 2019-03-23 LAB — COMPREHENSIVE METABOLIC PANEL
ALT: 32 IU/L (ref 0–44)
AST: 26 IU/L (ref 0–40)
Albumin/Globulin Ratio: 1.9 (ref 1.2–2.2)
Albumin: 5 g/dL — ABNORMAL HIGH (ref 3.8–4.9)
Alkaline Phosphatase: 95 IU/L (ref 39–117)
BUN/Creatinine Ratio: 14 (ref 9–20)
BUN: 21 mg/dL (ref 6–24)
Bilirubin Total: 0.4 mg/dL (ref 0.0–1.2)
CO2: 17 mmol/L — ABNORMAL LOW (ref 20–29)
Calcium: 10 mg/dL (ref 8.7–10.2)
Chloride: 102 mmol/L (ref 96–106)
Creatinine, Ser: 1.46 mg/dL — ABNORMAL HIGH (ref 0.76–1.27)
GFR calc Af Amer: 63 mL/min/{1.73_m2} (ref >59)
GFR calc non Af Amer: 55 mL/min/{1.73_m2} — ABNORMAL LOW (ref >59)
Globulin, Total: 2.6 g/dL (ref 1.5–4.5)
Glucose: 134 mg/dL — ABNORMAL HIGH (ref 65–99)
Potassium: 5 mmol/L (ref 3.5–5.2)
Sodium: 139 mmol/L (ref 134–144)
Total Protein: 7.6 g/dL (ref 6.0–8.5)

## 2019-03-23 LAB — HEMOGLOBIN A1C
Est. average glucose Bld gHb Est-mCnc: 157 mg/dL
Hgb A1c MFr Bld: 7.1 % — ABNORMAL HIGH (ref 4.8–5.6)

## 2019-03-23 LAB — LIPID PANEL
Chol/HDL Ratio: 4.2 ratio (ref 0.0–5.0)
Cholesterol, Total: 176 mg/dL (ref 100–199)
HDL: 42 mg/dL (ref >39)
LDL Chol Calc (NIH): 110 mg/dL — ABNORMAL HIGH (ref 0–99)
Triglycerides: 135 mg/dL (ref 0–149)
VLDL Cholesterol Cal: 24 mg/dL (ref 5–40)

## 2019-03-23 LAB — TSH: TSH: 1.38 u[IU]/mL (ref 0.450–4.500)

## 2019-03-23 LAB — SURGICAL PATHOLOGY

## 2019-03-23 MED ORDER — ATORVASTATIN CALCIUM 40 MG PO TABS: 40.0000 mg | ORAL_TABLET | Freq: Every day | ORAL | 0 refills | Status: DC

## 2019-03-23 NOTE — Telephone Encounter (Signed)
Called patient and no answer left voicemail for patient to return call. If patient returns call ok for PEC to advise patient of message.

## 2019-03-23 NOTE — Telephone Encounter (Signed)
Medication send into pharmacy. FYI

## 2019-03-23 NOTE — Telephone Encounter (Signed)
Patient returned call- results of labs reviewed as well as PCP recommendation. Patient states he will work on diet and exercise as advised. Patient states he has been out of cholesterol medication and has not been using- he will need refill of that if to continue. Patient states he was taking atorvastatin. Patient encouraged to make follow up appointment inApril for CPE. Patient only uses vitamins- no testosterone supplements.

## 2019-03-23 NOTE — Telephone Encounter (Signed)
-----   Message from Virginia Crews, MD sent at 03/23/2019  9:34 AM EST ----- A1c is close to goal, but not quite there.  We could increase one of his medications, but he could also just try decreasing carbohydrates in his diet and exercising more for the next 3 months and then let me repeat we can consider medication changes at that time.  Cholesterol is not quite to goal in the setting of diabetes.  Want LDL less than 70.  Is he taking his atorvastatin regularly?  If he is, I would recommend increasing the dose, but if he is not, I would recommend resuming that.  Kidney function is slightly low, but I am unclear of baseline as these are the first labs that we have gotten.  Hemoglobin is also high.  Is he taking any testosterone supplement?  I will plan to recheck these labs in 3 months as well.

## 2019-03-25 ENCOUNTER — Encounter: Payer: Self-pay | Admitting: Gastroenterology

## 2019-04-20 ENCOUNTER — Encounter: Payer: Self-pay | Admitting: Family Medicine

## 2019-04-30 ENCOUNTER — Ambulatory Visit: Payer: 59 | Attending: Internal Medicine

## 2019-04-30 DIAGNOSIS — Z23 Encounter for immunization: Secondary | ICD-10-CM

## 2019-04-30 NOTE — Progress Notes (Signed)
   Covid-19 Vaccination Clinic  Name:  Timothy Douglas    MRN: [id number] DOB: [birth year]  04/30/2019  [name] was observed post Covid-19 immunization for 15 minutes without incident. He was provided with Vaccine Information Sheet and instruction to access the V-Safe system.   [name] was instructed to call 911 with any severe reactions post vaccine: Marland Kitchen Difficulty breathing  . Swelling of face and throat  . A fast heartbeat  . A bad rash all over body  . Dizziness and weakness   Immunizations Administered    Name Date Dose VIS Date Route   Pfizer COVID-19 Vaccine 04/30/2019 11:45 AM 0.3 mL 01/08/2019 Intramuscular   Manufacturer: [place]   Lot: [id number]   [place]: [id number]

## 2019-05-25 ENCOUNTER — Other Ambulatory Visit: Payer: Self-pay

## 2019-05-25 ENCOUNTER — Ambulatory Visit: Payer: 59 | Admitting: Physician Assistant

## 2019-05-25 ENCOUNTER — Encounter: Payer: Self-pay | Admitting: Physician Assistant

## 2019-05-25 VITALS — BP 126/82 | HR 93 | Temp 95.9°F | Wt 237.8 lb

## 2019-05-25 DIAGNOSIS — H60502 Unspecified acute noninfective otitis externa, left ear: Secondary | ICD-10-CM | POA: Diagnosis not present

## 2019-05-25 DIAGNOSIS — H9192 Unspecified hearing loss, left ear: Secondary | ICD-10-CM

## 2019-05-25 DIAGNOSIS — H6122 Impacted cerumen, left ear: Secondary | ICD-10-CM | POA: Diagnosis not present

## 2019-05-25 MED ORDER — OFLOXACIN 0.3 % OT SOLN: 5.0000 [drp] | Freq: Every day | OTIC | 0 refills | Status: DC

## 2019-05-25 NOTE — Patient Instructions (Addendum)
Can use swimmers ear drops in ears to dry out water   Earwax Buildup, Adult The ears produce a substance called earwax that helps keep bacteria out of the ear and protects the skin in the ear canal. Occasionally, earwax can build up in the ear and cause discomfort or hearing loss. What increases the risk? This condition is more likely to develop in people who:  Are male.  Are elderly.  Naturally produce more earwax.  Clean their ears often with cotton swabs.  Use earplugs often.  Use in-ear headphones often.  Wear hearing aids.  Have narrow ear canals.  Have earwax that is overly thick or sticky.  Have eczema.  Are dehydrated.  Have excess hair in the ear canal. What are the signs or symptoms? Symptoms of this condition include:  Reduced or muffled hearing.  A feeling of fullness in the ear or feeling that the ear is plugged.  Fluid coming from the ear.  Ear pain.  Ear itch.  Ringing in the ear.  Coughing.  An obvious piece of earwax that can be seen inside the ear canal. How is this diagnosed? This condition may be diagnosed based on:  Your symptoms.  Your medical history.  An ear exam. During the exam, your health care provider will look into your ear with an instrument called an otoscope. You may have tests, including a hearing test. How is this treated? This condition may be treated by:  Using ear drops to soften the earwax.  Having the earwax removed by a health care provider. The health care provider may: ? Flush the ear with water. ? Use an instrument that has a loop on the end (curette). ? Use a suction device.  Surgery to remove the wax buildup. This may be done in severe cases. Follow these instructions at home:   Take over-the-counter and prescription medicines only as told by your health care provider.  Do not put any objects, including cotton swabs, into your ear. You can clean the opening of your ear canal with a washcloth or  facial tissue.  Follow instructions from your health care provider about cleaning your ears. Do not over-clean your ears.  Drink enough fluid to keep your urine clear or pale yellow. This will help to thin the earwax.  Keep all follow-up visits as told by your health care provider. If earwax builds up in your ears often or if you use hearing aids, consider seeing your health care provider for routine, preventive ear cleanings. Ask your health care provider how often you should schedule your cleanings.  If you have hearing aids, clean them according to instructions from the manufacturer and your health care provider. Contact a health care provider if:  You have ear pain.  You develop a fever.  You have blood, pus, or other fluid coming from your ear.  You have hearing loss.  You have ringing in your ears that does not go away.  Your symptoms do not improve with treatment.  You feel like the room is spinning (vertigo). Summary  Earwax can build up in the ear and cause discomfort or hearing loss.  The most common symptoms of this condition include reduced or muffled hearing and a feeling of fullness in the ear or feeling that the ear is plugged.  This condition may be diagnosed based on your symptoms, your medical history, and an ear exam.  This condition may be treated by using ear drops to soften the earwax or by having  the earwax removed by a health care provider.  Do not put any objects, including cotton swabs, into your ear. You can clean the opening of your ear canal with a washcloth or facial tissue. This information is not intended to replace advice given to you by your health care provider. Make sure you discuss any questions you have with your health care provider. Document Revised: 12/27/2016 Document Reviewed: 03/27/2016 Elsevier Patient Education  2020 [employer].

## 2019-05-25 NOTE — Progress Notes (Signed)
Established patient visit   Patient: Timothy Douglas   DOB: 1970-07-22   49 y.o. Male  MRN: [id number] Visit Date: 05/25/2019  Today's healthcare provider: Trinna Post, PA-C   Chief Complaint  Patient presents with  . Ear Fullness  I,Porsha C McClurkin,acting as a scribe for Trinna Post, PA-C.,have documented all relevant documentation on the behalf of Trinna Post, PA-C,as directed by  Trinna Post, PA-C while in the presence of Trinna Post, PA-C.  Subjective    Ear Fullness  There is pain in the left ear. This is a new problem. The current episode started in the past 7 days. The problem occurs constantly. The problem has been unchanged. There has been no fever. The pain is at a severity of 0/10. The patient is experiencing no pain. Associated symptoms include hearing loss. Pertinent negatives include no coughing, ear discharge, headaches, rhinorrhea or sore throat. He has tried nothing for the symptoms. The treatment provided no relief.  Patient states that he also has ringing and sometimes itching in his left ear. He denies feeling off balance.Patient report right is is fine. Patient reports he had vertigo in the past. He states that he got his first COVID vaccine 2 weeks ago and schedule to get his next one tomorrow,05/26/2019. Patient worries this represents hearing loss and may interfere with COVID vaccination.       Medications: Outpatient Medications Prior to Visit  Medication Sig  . amLODipine (NORVASC) 10 MG tablet Take 10 mg by mouth daily.  . Ascorbic Acid (VITAMIN C PO) Take by mouth daily.  Marland Kitchen atorvastatin (LIPITOR) 40 MG tablet Take 1 tablet (40 mg total) by mouth daily.  Marland Kitchen glipiZIDE (GLUCOTROL XL) 5 MG 24 hr tablet Take 5 mg by mouth daily.  . indomethacin (INDOCIN) 25 MG capsule Take 25 mg by mouth 3 (three) times daily as needed.  Marland Kitchen JANUMET XR 50-1000 MG TB24 Take 2 tablets by mouth daily.  Marland Kitchen JARDIANCE 25 MG TABS tablet Take 25 mg by mouth  daily.  Marland Kitchen lisinopril (ZESTRIL) 40 MG tablet Take 40 mg by mouth daily.  . meclizine (ANTIVERT) 25 MG tablet Take 25 mg by mouth every 6 (six) hours as needed.  . meloxicam (MOBIC) 15 MG tablet Take 15 mg by mouth daily.  . Multiple Vitamin (MULTIVITAMIN) tablet Take 1 tablet by mouth daily.  . sildenafil (VIAGRA) 100 MG tablet Take 0.5-1 tablets (50-100 mg total) by mouth daily as needed for erectile dysfunction. (Patient not taking: Reported on 03/15/2019)   No facility-administered medications prior to visit.    Review of Systems  Constitutional: Negative for fatigue and fever.  HENT: Positive for hearing loss. Negative for ear discharge, ear pain, rhinorrhea and sore throat.   Respiratory: Negative for cough.   Neurological: Negative for headaches.       Objective    BP 126/82 (BP Location: Left Arm, Patient Position: Sitting, Cuff Size: Normal)   Pulse 93   Temp (!) 95.9 F (35.5 C) (Temporal)   Wt 237 lb 12.8 oz (107.9 kg)   SpO2 99%   BMI 29.72 kg/m    Physical Exam Constitutional:      Appearance: Normal appearance.  HENT:     Right Ear: Tympanic membrane, ear canal and external ear normal. There is no impacted cerumen.     Left Ear: Tympanic membrane, ear canal and external ear normal. There is impacted cerumen.     Ears:  Comments: Left Tm visible and clear after ear lavage. Skin:    General: Skin is warm and dry.  Neurological:     Mental Status: He is alert and oriented to person, place, and time. Mental status is at baseline.  Psychiatric:        Mood and Affect: Mood normal.        Behavior: Behavior normal.       No results found for any visits on 05/25/19.  Assessment & Plan    1. Impacted cerumen of left ear  Ear lavage in the left ear for impacted cerumen. Instructed on using debrox drops for softening. Patient reports some sensation of fullness still. Advised on taking daily allergy medicine and have given ofloxacin drops as a precaution.    2. Hearing loss of left ear, unspecified hearing loss type  I have spent 25 minutes with this patient, >50% of which was spent on counseling and coordination of care.    Return if symptoms worsen or fail to improve.      I, Porsha C McClurkin, CMA, have reviewed all documentation for this visit. The documentation on 05/25/19 for the exam, diagnosis, procedures, and orders are all accurate and complete.  ITrinna Post, PA-C, have reviewed all documentation for this visit. The documentation on 05/25/19 for the exam, diagnosis, procedures, and orders are all accurate and complete.   Paulene Floor  [facility] [phone] (phone) [phone] (fax)  [place]

## 2019-05-26 ENCOUNTER — Ambulatory Visit: Payer: 59

## 2019-05-26 ENCOUNTER — Telehealth: Payer: Self-pay | Admitting: Family Medicine

## 2019-05-26 DIAGNOSIS — R42 Dizziness and giddiness: Secondary | ICD-10-CM

## 2019-05-26 MED ORDER — MECLIZINE HCL 25 MG PO TABS: 25.0000 mg | ORAL_TABLET | Freq: Four times a day (QID) | ORAL | 1 refills | Status: DC | PRN

## 2019-05-26 NOTE — Telephone Encounter (Signed)
I wrote a work note for today and tomorrow and sent in meclizine. Since this is a separate issue that what I saw him for (hearing loss), he should follow up with ENT if worsening for further evaluation.

## 2019-05-26 NOTE — Telephone Encounter (Signed)
Please advise 

## 2019-05-26 NOTE — Telephone Encounter (Signed)
Pt is calling back and would like a work note for today and tomorrow due to ringing in ears and vertigo. Pt is truck Geophysicist/field seismologist

## 2019-05-26 NOTE — Telephone Encounter (Signed)
Pt seen Timothy Douglas yesterday and pt is now having vertigo this morning and would like a rx meclizine . [facility] [address]. Pt was given ear drop medication. Please advice

## 2019-05-27 ENCOUNTER — Encounter: Payer: Self-pay | Admitting: Family Medicine

## 2019-05-27 ENCOUNTER — Other Ambulatory Visit: Payer: Self-pay

## 2019-05-27 ENCOUNTER — Ambulatory Visit: Payer: 59 | Admitting: Family Medicine

## 2019-05-27 VITALS — BP 109/76 | HR 109 | Temp 97.1°F | Resp 16 | Wt 236.0 lb

## 2019-05-27 DIAGNOSIS — H6982 Other specified disorders of Eustachian tube, left ear: Secondary | ICD-10-CM | POA: Diagnosis not present

## 2019-05-27 DIAGNOSIS — J0140 Acute pansinusitis, unspecified: Secondary | ICD-10-CM

## 2019-05-27 MED ORDER — AMOXICILLIN 875 MG PO TABS: 875.0000 mg | ORAL_TABLET | Freq: Two times a day (BID) | ORAL | 0 refills | Status: DC

## 2019-05-27 NOTE — Progress Notes (Signed)
Established patient visit   Patient: Timothy Douglas   DOB: 1970-12-09   49 y.o. Male  MRN: [id number] Visit Date: 05/27/2019  Today's healthcare provider: Vernie Murders, PA   Ringing in left ear. Subjective    Ear Fullness  There is pain in the left ear. This is a new problem. Episode onset: 6 days ago. The problem occurs constantly. The problem has been gradually worsening. There has been no fever. Associated symptoms include hearing loss (decreased hearing in left ear). Pertinent negatives include no abdominal pain, coughing, ear discharge, headaches, rhinorrhea, sore throat or vomiting. Treatments tried: OTC oral supplements for tinnitus, also ear drops.  Patient was seen in the office on 05/25/2019 by Adriana for cerumen impaction where an ear irrigation was done. Patient feels that his ear has become more clogged since that visit. Patient mentioned that he had the 1st dose of the COVID vaccine on 04/30/2019.   Past Medical History:  Diagnosis Date  . DJD (degenerative joint disease)   . Essential hypertension   . Hyperlipidemia due to type 2 diabetes mellitus ([place])   . Shoulder pain   . T2DM (type 2 diabetes mellitus) ([place])   . Vertigo    1 episode over 10 yrs ago   Past Surgical History:  Procedure Laterality Date  . APPENDECTOMY    . COLONOSCOPY WITH PROPOFOL N/A 03/19/2019   Procedure: COLONOSCOPY WITH BIOPSY;  Surgeon: Lucilla Lame, MD;  Location: [facility];  Service: Endoscopy;  Laterality: N/A;  Diabetic - oral meds Priority 4  . EYE SURGERY    . KELOID EXCISION  1990s  . KNEE ARTHROSCOPY Left   . LUMBAR DISC SURGERY    . LUMBAR FUSION     L5-S1  . POLYPECTOMY N/A 03/19/2019   Procedure: POLYPECTOMY;  Surgeon: Lucilla Lame, MD;  Location: [facility];  Service: Endoscopy;  Laterality: N/A;  . SPINAL CORD STIMULATOR IMPLANT     Social History   Socioeconomic History  . Marital status: Married    Spouse name: Laquintin Atkison  . Number of  children: 3  . Years of education: Not on file  . Highest education level: Not on file  Occupational History  . Not on file  Tobacco Use  . Smoking status: Never Smoker  . Smokeless tobacco: Never Used  Substance and Sexual Activity  . Alcohol use: Yes    Alcohol/week: 2.0 - 3.0 standard drinks    Types: 1 - 2 Glasses of wine, 1 Cans of beer per week  . Drug use: Never  . Sexual activity: Yes    Partners: Female    Birth control/protection: None  Other Topics Concern  . Not on file  Social History Narrative  . Not on file   Social Determinants of Health   Financial Resource Strain:   . Difficulty of Paying Living Expenses:   Food Insecurity:   . Worried About Charity fundraiser in the Last Year:   . Arboriculturist in the Last Year:   Transportation Needs:   . Film/video editor (Medical):   Marland Kitchen Lack of Transportation (Non-Medical):   Physical Activity:   . Days of Exercise per Week:   . Minutes of Exercise per Session:   Stress:   . Feeling of Stress :   Social Connections:   . Frequency of Communication with Friends and Family:   . Frequency of Social Gatherings with Friends and Family:   . Attends Religious Services:   .  Active Member of Clubs or Organizations:   . Attends Archivist Meetings:   Marland Kitchen Marital Status:   Intimate Partner Violence:   . Fear of Current or Ex-Partner:   . Emotionally Abused:   Marland Kitchen Physically Abused:   . Sexually Abused:    Family History  Problem Relation Age of Onset  . Hypertension Mother   . Bell's palsy Mother   . Hypertension Father   . Thyroid disease Sister   . Thyroid disease Brother   . Diabetes Maternal Grandmother   . Leukemia Maternal Grandfather   . Diabetes Paternal Grandmother   . Leukemia Maternal Uncle   . Congestive Heart Failure Paternal Great-grandfather   . Breast cancer Neg Hx   . Prostate cancer Neg Hx   . Colon cancer Neg Hx    Allergies  Allergen Reactions  . Tape         Medications: Outpatient Medications Prior to Visit  Medication Sig  . amLODipine (NORVASC) 10 MG tablet Take 10 mg by mouth daily.  . Ascorbic Acid (VITAMIN C PO) Take by mouth daily.  Marland Kitchen atorvastatin (LIPITOR) 40 MG tablet Take 1 tablet (40 mg total) by mouth daily.  Marland Kitchen glipiZIDE (GLUCOTROL XL) 5 MG 24 hr tablet Take 5 mg by mouth daily.  . indomethacin (INDOCIN) 25 MG capsule Take 25 mg by mouth 3 (three) times daily as needed.  Marland Kitchen JANUMET XR 50-1000 MG TB24 Take 2 tablets by mouth daily.  Marland Kitchen JARDIANCE 25 MG TABS tablet Take 25 mg by mouth daily.  Marland Kitchen lisinopril (ZESTRIL) 40 MG tablet Take 40 mg by mouth daily.  . meclizine (ANTIVERT) 25 MG tablet Take 1 tablet (25 mg total) by mouth every 6 (six) hours as needed.  . meloxicam (MOBIC) 15 MG tablet Take 15 mg by mouth daily.  . Multiple Vitamin (MULTIVITAMIN) tablet Take 1 tablet by mouth daily.  Marland Kitchen ofloxacin (FLOXIN OTIC) 0.3 % OTIC solution Place 5 drops into the left ear daily.  . sildenafil (VIAGRA) 100 MG tablet Take 0.5-1 tablets (50-100 mg total) by mouth daily as needed for erectile dysfunction. (Patient not taking: Reported on 05/27/2019)   No facility-administered medications prior to visit.    Review of Systems  Constitutional: Negative for appetite change, chills and fever.  HENT: Positive for hearing loss (decreased hearing in left ear) and tinnitus. Negative for ear discharge, rhinorrhea and sore throat.        Left ear feels stopped up/ decreased hearing in left ear  Respiratory: Negative for cough, chest tightness, shortness of breath and wheezing.   Cardiovascular: Negative for chest pain and palpitations.  Gastrointestinal: Negative for abdominal pain, nausea and vomiting.  Neurological: Positive for dizziness. Negative for headaches.      Objective    BP 109/76 (BP Location: Right Arm)   Pulse (!) 109   Temp (!) 97.1 F (36.2 C) (Temporal)   Resp 16   Wt 236 lb (107 kg)   BMI 29.50 kg/m    Physical  Exam Constitutional:      General: He is not in acute distress.    Appearance: He is well-developed.  HENT:     Head: Normocephalic and atraumatic.     Right Ear: Hearing and ear canal normal. There is no impacted cerumen.     Left Ear: Hearing and ear canal normal. There is no impacted cerumen.     Ears:     Comments: No erythema to TM's or canals. No drainage or TM  perforations. Left TM hazy without light reflex and hearing diminished.    Nose: Nose normal.     Comments: No transillumination of maxillary or frontal sinuses. Slight pressure sensation. Normal membranes and clear posterior pharynx. No local lymphadenopathy. Eyes:     General: Lids are normal. No scleral icterus.       Right eye: No discharge.        Left eye: No discharge.     Conjunctiva/sclera: Conjunctivae normal.  Cardiovascular:     Rate and Rhythm: Normal rate.     Heart sounds: Normal heart sounds.  Pulmonary:     Effort: Pulmonary effort is normal. No respiratory distress.     Breath sounds: Normal breath sounds.  Musculoskeletal:        General: Normal range of motion.     Cervical back: Neck supple.  Skin:    Findings: No lesion or rash.  Neurological:     General: No focal deficit present.     Mental Status: He is alert and oriented to person, place, and time.     Gait: Gait normal.  Psychiatric:        Speech: Speech normal.        Behavior: Behavior normal.        Thought Content: Thought content normal.       Assessment & Plan     1. Dysfunction of left eustachian tube Stopped up sensation and ringing in the left ear over the past 3-4 days. Had ear lavage on 05-25-19 and notice some dizziness with PND at night. No sneezing or fever. Diminished hearing. Suspected eustachian dysfunction in the left ear.  2. Subacute pansinusitis No transillumination of all sinuses. No fever or cough. Some PND but no rhinorrhea. Recommend Amoxicillin, Flonase Nasal Spray, OTC antihistamine with decongestant and  use saline irrigation of sinuses prn. May need referral to ENT if no better in 10-14 days with this regimen. - amoxicillin (AMOXIL) 875 MG tablet; Take 1 tablet (875 mg total) by mouth 2 (two) times daily.  Dispense: 20 tablet; Refill: 0   No follow-ups on file.      Andres Shad, PA, have reviewed all documentation for this visit. The documentation on 05/27/19 for the exam, diagnosis, procedures, and orders are all accurate and complete.    Vernie Murders, [place] [phone] (phone) [phone] (fax)  [place]

## 2019-05-27 NOTE — Patient Instructions (Signed)
Eustachian Tube Dysfunction  Eustachian tube dysfunction refers to a condition in which a blockage develops in the narrow passage that connects the middle ear to the back of the nose (eustachian tube). The eustachian tube regulates air pressure in the middle ear by letting air move between the ear and nose. It also helps to drain fluid from the middle ear space. Eustachian tube dysfunction can affect one or both ears. When the eustachian tube does not function properly, air pressure, fluid, or both can build up in the middle ear. What are the causes? This condition occurs when the eustachian tube becomes blocked or cannot open normally. Common causes of this condition include:  Ear infections.  Colds and other infections that affect the nose, mouth, and throat (upper respiratory tract).  Allergies.  Irritation from cigarette smoke.  Irritation from stomach acid coming up into the esophagus (gastroesophageal reflux). The esophagus is the tube that carries food from the mouth to the stomach.  Sudden changes in air pressure, such as from descending in an airplane or scuba diving.  Abnormal growths in the nose or throat, such as: ? Growths that line the nose (nasal polyps). ? Abnormal growth of cells (tumors). ? Enlarged tissue at the back of the throat (adenoids). What increases the risk? You are more likely to develop this condition if:  You smoke.  You are overweight.  You are a child who has: ? Certain birth defects of the mouth, such as cleft palate. ? Large tonsils or adenoids. What are the signs or symptoms? Common symptoms of this condition include:  A feeling of fullness in the ear.  Ear pain.  Clicking or popping noises in the ear.  Ringing in the ear.  Hearing loss.  Loss of balance.  Dizziness. Symptoms may get worse when the air pressure around you changes, such as when you travel to an area of high elevation, fly on an airplane, or go scuba diving. How is  this diagnosed? This condition may be diagnosed based on:  Your symptoms.  A physical exam of your ears, nose, and throat.  Tests, such as those that measure: ? The movement of your eardrum (tympanogram). ? Your hearing (audiometry). How is this treated? Treatment depends on the cause and severity of your condition.  In mild cases, you may relieve your symptoms by moving air into your ears. This is called "popping the ears."  In more severe cases, or if you have symptoms of fluid in your ears, treatment may include: ? Medicines to relieve congestion (decongestants). ? Medicines that treat allergies (antihistamines). ? Nasal sprays or ear drops that contain medicines that reduce swelling (steroids). ? A procedure to drain the fluid in your eardrum (myringotomy). In this procedure, a small tube is placed in the eardrum to:  Drain the fluid.  Restore the air in the middle ear space. ? A procedure to insert a balloon device through the nose to inflate the opening of the eustachian tube (balloon dilation). Follow these instructions at home: Lifestyle  Do not do any of the following until your health care provider approves: ? Travel to high altitudes. ? Fly in airplanes. ? Work in a pressurized cabin or room. ? Scuba dive.  Do not use any products that contain nicotine or tobacco, such as cigarettes and e-cigarettes. If you need help quitting, ask your health care provider.  Keep your ears dry. Wear fitted earplugs during showering and bathing. Dry your ears completely after. General instructions  Take over-the-counter   and prescription medicines only as told by your health care provider.  Use techniques to help pop your ears as recommended by your health care provider. These may include: ? Chewing gum. ? Yawning. ? Frequent, forceful swallowing. ? Closing your mouth, holding your nose closed, and gently blowing as if you are trying to blow air out of your nose.  Keep all  follow-up visits as told by your health care provider. This is important. Contact a health care provider if:  Your symptoms do not go away after treatment.  Your symptoms come back after treatment.  You are unable to pop your ears.  You have: ? A fever. ? Pain in your ear. ? Pain in your head or neck. ? Fluid draining from your ear.  Your hearing suddenly changes.  You become very dizzy.  You lose your balance. Summary  Eustachian tube dysfunction refers to a condition in which a blockage develops in the eustachian tube.  It can be caused by ear infections, allergies, inhaled irritants, or abnormal growths in the nose or throat.  Symptoms include ear pain, hearing loss, or ringing in the ears.  Mild cases are treated with maneuvers to unblock the ears, such as yawning or ear popping.  Severe cases are treated with medicines. Surgery may also be done (rare). This information is not intended to replace advice given to you by your health care provider. Make sure you discuss any questions you have with your health care provider. Document Revised: 05/06/2017 Document Reviewed: 05/06/2017 Elsevier Patient Education  [place]. Sinusitis, Adult Sinusitis is inflammation of your sinuses. Sinuses are hollow spaces in the bones around your face. Your sinuses are located:  Around your eyes.  In the middle of your forehead.  Behind your nose.  In your cheekbones. Mucus normally drains out of your sinuses. When your nasal tissues become inflamed or swollen, mucus can become trapped or blocked. This allows bacteria, viruses, and fungi to grow, which leads to infection. Most infections of the sinuses are caused by a virus. Sinusitis can develop quickly. It can last for up to 4 weeks (acute) or for more than 12 weeks (chronic). Sinusitis often develops after a cold. What are the causes? This condition is caused by anything that creates swelling in the sinuses or stops mucus  from draining. This includes:  Allergies.  Asthma.  Infection from bacteria or viruses.  Deformities or blockages in your nose or sinuses.  Abnormal growths in the nose (nasal polyps).  Pollutants, such as chemicals or irritants in the air.  Infection from fungi (rare). What increases the risk? You are more likely to develop this condition if you:  Have a weak body defense system (immune system).  Do a lot of swimming or diving.  Overuse nasal sprays.  Smoke. What are the signs or symptoms? The main symptoms of this condition are pain and a feeling of pressure around the affected sinuses. Other symptoms include:  Stuffy nose or congestion.  Thick drainage from your nose.  Swelling and warmth over the affected sinuses.  Headache.  Upper toothache.  A cough that may get worse at night.  Extra mucus that collects in the throat or the back of the nose (postnasal drip).  Decreased sense of smell and taste.  Fatigue.  A fever.  Sore throat.  Bad breath. How is this diagnosed? This condition is diagnosed based on:  Your symptoms.  Your medical history.  A physical exam.  Tests to find out if  your condition is acute or chronic. This may include: ? Checking your nose for nasal polyps. ? Viewing your sinuses using a device that has a light (endoscope). ? Testing for allergies or bacteria. ? Imaging tests, such as an MRI or CT scan. In rare cases, a bone biopsy may be done to rule out more serious types of fungal sinus disease. How is this treated? Treatment for sinusitis depends on the cause and whether your condition is chronic or acute.  If caused by a virus, your symptoms should go away on their own within 10 days. You may be given medicines to relieve symptoms. They include: ? Medicines that shrink swollen nasal passages (topical intranasal decongestants). ? Medicines that treat allergies (antihistamines). ? A spray that eases inflammation of the  nostrils (topical intranasal corticosteroids). ? Rinses that help get rid of thick mucus in your nose (nasal saline washes).  If caused by bacteria, your health care provider may recommend waiting to see if your symptoms improve. Most bacterial infections will get better without antibiotic medicine. You may be given antibiotics if you have: ? A severe infection. ? A weak immune system.  If caused by narrow nasal passages or nasal polyps, you may need to have surgery. Follow these instructions at home: Medicines  Take, use, or apply over-the-counter and prescription medicines only as told by your health care provider. These may include nasal sprays.  If you were prescribed an antibiotic medicine, take it as told by your health care provider. Do not stop taking the antibiotic even if you start to feel better. Hydrate and humidify   Drink enough fluid to keep your urine pale yellow. Staying hydrated will help to thin your mucus.  Use a cool mist humidifier to keep the humidity level in your home above 50%.  Inhale steam for 10-15 minutes, 3-4 times a day, or as told by your health care provider. You can do this in the bathroom while a hot shower is running.  Limit your exposure to cool or dry air. Rest  Rest as much as possible.  Sleep with your head raised (elevated).  Make sure you get enough sleep each night. General instructions   Apply a warm, moist washcloth to your face 3-4 times a day or as told by your health care provider. This will help with discomfort.  Wash your hands often with soap and water to reduce your exposure to germs. If soap and water are not available, use hand sanitizer.  Do not smoke. Avoid being around people who are smoking (secondhand smoke).  Keep all follow-up visits as told by your health care provider. This is important. Contact a health care provider if:  You have a fever.  Your symptoms get worse.  Your symptoms do not improve within 10  days. Get help right away if:  You have a severe headache.  You have persistent vomiting.  You have severe pain or swelling around your face or eyes.  You have vision problems.  You develop confusion.  Your neck is stiff.  You have trouble breathing. Summary  Sinusitis is soreness and inflammation of your sinuses. Sinuses are hollow spaces in the bones around your face.  This condition is caused by nasal tissues that become inflamed or swollen. The swelling traps or blocks the flow of mucus. This allows bacteria, viruses, and fungi to grow, which leads to infection.  If you were prescribed an antibiotic medicine, take it as told by your health care provider. Do not  stop taking the antibiotic even if you start to feel better.  Keep all follow-up visits as told by your health care provider. This is important. This information is not intended to replace advice given to you by your health care provider. Make sure you discuss any questions you have with your health care provider. Document Revised: 06/16/2017 Document Reviewed: 06/16/2017 Elsevier Patient Education  [place].

## 2019-05-31 ENCOUNTER — Encounter: Payer: Self-pay | Admitting: Family Medicine

## 2019-05-31 ENCOUNTER — Ambulatory Visit: Payer: 59 | Admitting: Family Medicine

## 2019-05-31 ENCOUNTER — Other Ambulatory Visit: Payer: Self-pay

## 2019-05-31 VITALS — BP 122/80 | HR 90 | Temp 97.1°F | Wt 241.0 lb

## 2019-05-31 DIAGNOSIS — J0140 Acute pansinusitis, unspecified: Secondary | ICD-10-CM | POA: Diagnosis not present

## 2019-05-31 DIAGNOSIS — H9042 Sensorineural hearing loss, unilateral, left ear, with unrestricted hearing on the contralateral side: Secondary | ICD-10-CM

## 2019-05-31 NOTE — Progress Notes (Addendum)
Established patient visit  I,Elena D DeSanto,acting as a scribe for [employer], PA.,have documented all relevant documentation on the behalf of Vernie Murders, PA,as directed by  [employer], PA while in the presence of [employer], [place].   Patient: Timothy Douglas   DOB: [birth year]   49 y.o. Male  MRN: [id number] Visit Date: 05/31/2019  Today's healthcare provider: Vernie Murders, PA   Chief Complaint  Patient presents with  . Tinnitus  . Ear Fullness   Subjective    HPI  Eustachian tube dysfunction and pansinusitis follow up.  Patient was last seen on 05/27/19.  He was given Amoxicillin, Flonase nasal spray and told to get OTC antihistamine with decongestant and use saline irrigations. He was advised that he may need referral to ENT.   Patient states he may be slightly better but still having ringing in his ears, difficulty hearing when several things are going on, some off balanced feeling.  He states it feels like something wants to drain but won't and sounds like a busted speaker.  Past Medical History:  Diagnosis Date  . DJD (degenerative joint disease)   . Essential hypertension   . Hyperlipidemia due to type 2 diabetes mellitus ([place])   . Shoulder pain   . T2DM (type 2 diabetes mellitus) ([place])   . Vertigo    1 episode over 10 yrs ago   Past Surgical History:  Procedure Laterality Date  . APPENDECTOMY    . COLONOSCOPY WITH PROPOFOL N/A 03/19/2019   Procedure: COLONOSCOPY WITH BIOPSY;  Surgeon: Lucilla Lame, MD;  Location: [facility];  Service: Endoscopy;  Laterality: N/A;  Diabetic - oral meds Priority 4  . EYE SURGERY    . KELOID EXCISION  1990s  . KNEE ARTHROSCOPY Left   . LUMBAR DISC SURGERY    . LUMBAR FUSION     L5-S1  . POLYPECTOMY N/A 03/19/2019   Procedure: POLYPECTOMY;  Surgeon: Lucilla Lame, MD;  Location: [facility];  Service: Endoscopy;  Laterality: N/A;  . SPINAL CORD STIMULATOR IMPLANT     Family History  Problem  Relation Age of Onset  . Hypertension Mother   . Bell's palsy Mother   . Hypertension Father   . Thyroid disease Sister   . Thyroid disease Brother   . Diabetes Maternal Grandmother   . Leukemia Maternal Grandfather   . Diabetes Paternal Grandmother   . Leukemia Maternal Uncle   . Congestive Heart Failure Paternal Great-grandfather   . Breast cancer Neg Hx   . Prostate cancer Neg Hx   . Colon cancer Neg Hx    Allergies  Allergen Reactions  . Tape    Medications: Outpatient Medications Prior to Visit  Medication Sig  . amLODipine (NORVASC) 10 MG tablet Take 10 mg by mouth daily.  Marland Kitchen amoxicillin (AMOXIL) 875 MG tablet Take 1 tablet (875 mg total) by mouth 2 (two) times daily.  . Ascorbic Acid (VITAMIN C PO) Take by mouth daily.  Marland Kitchen atorvastatin (LIPITOR) 40 MG tablet Take 1 tablet (40 mg total) by mouth daily.  Marland Kitchen glipiZIDE (GLUCOTROL XL) 5 MG 24 hr tablet Take 5 mg by mouth daily.  Marland Kitchen JANUMET XR 50-1000 MG TB24 Take 2 tablets by mouth daily.  Marland Kitchen JARDIANCE 25 MG TABS tablet Take 25 mg by mouth daily.  Marland Kitchen lisinopril (ZESTRIL) 40 MG tablet Take 40 mg by mouth daily.  . meclizine (ANTIVERT) 25 MG tablet Take 1 tablet (25 mg total) by mouth every 6 (six)  hours as needed.  . Multiple Vitamin (MULTIVITAMIN) tablet Take 1 tablet by mouth daily.  . indomethacin (INDOCIN) 25 MG capsule Take 25 mg by mouth 3 (three) times daily as needed.  . meloxicam (MOBIC) 15 MG tablet Take 15 mg by mouth daily.  Marland Kitchen ofloxacin (FLOXIN OTIC) 0.3 % OTIC solution Place 5 drops into the left ear daily. (Patient not taking: Reported on 05/31/2019)  . sildenafil (VIAGRA) 100 MG tablet Take 0.5-1 tablets (50-100 mg total) by mouth daily as needed for erectile dysfunction. (Patient not taking: Reported on 05/27/2019)   No facility-administered medications prior to visit.    Review of Systems  Constitutional: Negative for fever.  HENT: Positive for congestion, ear pain, postnasal drip, sinus pressure, sinus pain and  tinnitus. Negative for ear discharge and sore throat.   Respiratory: Negative for cough, shortness of breath and wheezing.   Cardiovascular: Negative for chest pain.      Objective    BP 122/80 (BP Location: Right Arm, Patient Position: Sitting, Cuff Size: Normal)   Pulse 90   Temp (!) 97.1 F (36.2 C) (Skin)   Wt 241 lb (109.3 kg)   SpO2 94%   BMI 30.12 kg/m    Physical Exam Constitutional:      General: He is not in acute distress.    Appearance: He is well-developed.  HENT:     Head: Normocephalic and atraumatic.     Right Ear: Hearing and tympanic membrane normal. There is no impacted cerumen.     Left Ear: Hearing and tympanic membrane normal. There is no impacted cerumen.     Ears:     Comments: No redness or air-fluid lines/bubbles. Hearing lateralizes to the right with tuning fork.    Nose: Nose normal.  Eyes:     General: Lids are normal. No scleral icterus.       Right eye: No discharge.        Left eye: No discharge.     Conjunctiva/sclera: Conjunctivae normal.  Pulmonary:     Effort: Pulmonary effort is normal. No respiratory distress.  Musculoskeletal:        General: Normal range of motion.  Skin:    Findings: No lesion or rash.  Neurological:     Mental Status: He is alert and oriented to person, place, and time.  Psychiatric:        Speech: Speech normal.        Behavior: Behavior normal.        Thought Content: Thought content normal.      Assessment & Plan     1. Sensorineural hearing loss (SNHL) of left ear with unrestricted hearing of right ear Still having a "busted speaker" hearing in the left ear despite treatment for pansinusitis. No dizziness now but concerned it may occur. Weber test lateralizes to the right and normal AC>BC Rinne test. Schedule ENT referral. - Ambulatory referral to ENT  2. Subacute pansinusitis Less nasal congestion and discomfort with use of Flonase, Zyrtec and Amoxicillin over the past 5 days. With partial hearing  loss in the left ear, will schedule ENT referral. - Ambulatory referral to ENT   No follow-ups on file.      Andres Shad, PA, have reviewed all documentation for this visit. The documentation on 05/31/19 for the exam, diagnosis, procedures, and orders are all accurate and complete.    Vernie Murders, [place] [phone] (phone) [phone] (fax)  [place]

## 2019-05-31 NOTE — Patient Instructions (Signed)
Hearing Loss Hearing loss is a partial or total loss of the ability to hear. This can be temporary or permanent, and it can happen in one or both ears. Medical care is necessary to treat hearing loss properly and to prevent the condition from getting worse. Your hearing may partially or completely come back, depending on what caused your hearing loss and how severe it is. In some cases, hearing loss is permanent. What are the causes? Common causes of hearing loss include:  Too much wax in the ear canal.  Infection of the ear canal or middle ear.  Fluid in the middle ear.  Injury to the ear or surrounding area.  An object stuck in the ear.  A history of prolonged exposure to loud sounds, such as music. Less common causes of hearing loss include:  Tumors in the ear.  Viral or bacterial infections, such as meningitis.  A hole in the eardrum (perforated eardrum).  Problems with the hearing nerve that sends signals between the brain and the ear.  Certain medicines. What are the signs or symptoms? Symptoms of this condition may include:  Difficulty telling the difference between sounds.  Difficulty following a conversation when there is background noise.  Lack of response to sounds in your environment. This may be most noticeable when you do not respond to startling sounds.  Needing to turn up the volume on the television, radio, or other devices.  Ringing in the ears.  Dizziness. How is this diagnosed? This condition is diagnosed based on:  A physical exam.  A hearing test (audiometry). The audiometry test will be performed by a hearing specialist (audiologist). You may also be referred to an ear, nose, and throat (ENT) specialist (otolaryngologist). How is this treated? Treatment for hearing loss may include:  Ear wax removal.  Medicines to treat or prevent infection (antibiotics).  Medicines to reduce inflammation (corticosteroids).  Hearing aids for hearing  loss related to nerve damage. Follow these instructions at home:  If you were prescribed an antibiotic medicine, take it as told by your health care provider. Do not stop taking the antibiotic even if you start to feel better.  Take over-the-counter and prescription medicines only as told by your health care provider.  Avoid loud noises.  Return to your normal activities as told by your health care provider. Ask your health care provider what activities are safe for you.  Keep all follow-up visits as told by your health care provider. This is important. Contact a health care provider if:  You feel dizzy.  You develop new symptoms.  You vomit or feel nauseous.  You have a fever. Get help right away if:  You develop sudden changes in your vision.  You have severe ear pain.  You have new or increased weakness.  You have a severe headache. Summary  Hearing loss is a decreased ability to hear sounds around you. It can be temporary or permanent.  Treatment will depend on the cause of your hearing loss. It may include ear wax removal, medicines, or a hearing aid.  Your hearing may partially or completely come back, depending on what caused your hearing loss and how severe it is.  Keep all follow-up visits as told by your health care provider. This is important. This information is not intended to replace advice given to you by your health care provider. Make sure you discuss any questions you have with your health care provider. Document Revised: 10/14/2017 Document Reviewed: 10/14/2017 Elsevier Patient Education    2020 Elsevier Inc.  

## 2019-07-22 ENCOUNTER — Telehealth: Payer: Self-pay | Admitting: Family Medicine

## 2019-07-22 NOTE — Telephone Encounter (Signed)
Requested medication (s) are due for refill today: Yes  Requested medication (s) are on the active medication list: Yes  Last refill:  02/05/19  Future visit scheduled: No  Notes to clinic:  Historical provider.    Requested Prescriptions  Pending Prescriptions Disp Refills   amLODipine (NORVASC) 10 MG tablet 90 tablet 1    Sig: Take 1 tablet (10 mg total) by mouth daily.      Cardiovascular:  Calcium Channel Blockers Passed - 07/22/2019  2:55 PM      Passed - Last BP in normal range    BP Readings from Last 1 Encounters:  05/31/19 122/80          Passed - Valid encounter within last 6 months    Recent Outpatient Visits           1 month ago Sensorineural hearing loss (SNHL) of left ear with unrestricted hearing of right ear   [place], [place] E, [place]   1 month ago Dysfunction of left eustachian tube   [place], [place] E, [place]   1 month ago Impacted cerumen of left ear   [place], Fabio Bering M, PA-C   5 months ago Type 2 diabetes mellitus with other specified complication, without long-term current use of insulin ([place])   [facility] [place], Dionne Bucy, MD                glipiZIDE (GLUCOTROL XL) 5 MG 24 hr tablet 90 tablet 1    Sig: Take 1 tablet (5 mg total) by mouth daily.      Endocrinology:  Diabetes - Sulfonylureas Passed - 07/22/2019  2:55 PM      Passed - HBA1C is between 0 and 7.9 and within 180 days    Hemoglobin A1C  Date Value Ref Range Status  11/28/2018 7.7  Final   Hgb A1c MFr Bld  Date Value Ref Range Status  03/22/2019 7.1 (H) 4.8 - 5.6 % Final    Comment:             Prediabetes: 5.7 - 6.4          Diabetes: >6.4          Glycemic control for adults with diabetes: <7.0           Passed - Valid encounter within last 6 months    Recent Outpatient Visits           1 month ago Sensorineural hearing loss (SNHL) of left ear with unrestricted hearing of  right ear   [place], [place] E, [place]   1 month ago Dysfunction of left eustachian tube   [employer], [place] E, [place]   1 month ago Impacted cerumen of left ear   [place], Fabio Bering M, PA-C   5 months ago Type 2 diabetes mellitus with other specified complication, without long-term current use of insulin ([place])   [facility] [place], Dionne Bucy, MD                indomethacin (INDOCIN) 25 MG capsule 90 capsule 1    Sig: Take 1 capsule (25 mg total) by mouth 3 (three) times daily as needed.      Analgesics:  NSAIDS Failed - 07/22/2019  2:55 PM      Failed - Cr in normal range and within 360 days    Creatinine, Ser  Date Value Ref Range Status  03/22/2019 1.46 (H) 0.76 - 1.27 mg/dL Final          Failed - HGB in normal range and within 360 days    Hemoglobin  Date Value Ref Range Status  03/22/2019 17.9 (H) 13.0 - 17.7 g/dL Final          Passed - Patient is not pregnant      Passed - Valid encounter within last 12 months    Recent Outpatient Visits           1 month ago Sensorineural hearing loss (SNHL) of left ear with unrestricted hearing of right ear   [place], [place] E, PA   1 month ago Dysfunction of left eustachian tube   [employer], [place] E, [place]   1 month ago Impacted cerumen of left ear   [place], Fabio Bering M, PA-C   5 months ago Type 2 diabetes mellitus with other specified complication, without long-term current use of insulin ([place])   [facility] [place], Dionne Bucy, MD                JARDIANCE 25 MG TABS tablet 90 tablet 1    Sig: Take 1 tablet (25 mg total) by mouth daily.      Endocrinology:  Diabetes - SGLT2 Inhibitors Failed - 07/22/2019  2:55 PM      Failed - Cr in normal range and within 360 days    Creatinine, Ser  Date Value Ref Range Status  03/22/2019 1.46 (H)  0.76 - 1.27 mg/dL Final          Passed - LDL in normal range and within 360 days    LDL Chol Calc (NIH)  Date Value Ref Range Status  03/22/2019 110 (H) 0 - 99 mg/dL Final          Passed - HBA1C is between 0 and 7.9 and within 180 days    Hemoglobin A1C  Date Value Ref Range Status  11/28/2018 7.7  Final   Hgb A1c MFr Bld  Date Value Ref Range Status  03/22/2019 7.1 (H) 4.8 - 5.6 % Final    Comment:             Prediabetes: 5.7 - 6.4          Diabetes: >6.4          Glycemic control for adults with diabetes: <7.0           Passed - eGFR in normal range and within 360 days    GFR calc Af Amer  Date Value Ref Range Status  03/22/2019 63 >59 mL/min/1.73 Final   GFR calc non Af Amer  Date Value Ref Range Status  03/22/2019 55 (L) >59 mL/min/1.73 Final          Passed - Valid encounter within last 6 months    Recent Outpatient Visits           1 month ago Sensorineural hearing loss (SNHL) of left ear with unrestricted hearing of right ear   [place], [place], PA   1 month ago Dysfunction of left eustachian tube   [employer], [place], [place]   1 month ago Impacted cerumen of left ear   [place], [place], PA-C   5 months ago Type 2 diabetes mellitus with other specified complication, without long-term current use of insulin [facility])   [place], [place],  MD

## 2019-07-22 NOTE — Telephone Encounter (Signed)
indomethacin (INDOCIN) 25 MG capsule  JARDIANCE 25 MG TABS tablet  amLODipine (NORVASC) 10 MG tablet  glipiZIDE (GLUCOTROL XL) 5 MG 24 hr tablet    Patient is requesting refills.    Pharmacy:  [id number] [address], [place] Phone:  [phone]  Fax:  [phone]

## 2019-07-23 MED ORDER — INDOMETHACIN 25 MG PO CAPS: 25.0000 mg | ORAL_CAPSULE | Freq: Three times a day (TID) | ORAL | 0 refills | Status: DC | PRN

## 2019-07-23 MED ORDER — JARDIANCE 25 MG PO TABS: 25.0000 mg | ORAL_TABLET | Freq: Every day | ORAL | 0 refills | Status: DC

## 2019-07-23 MED ORDER — AMLODIPINE BESYLATE 10 MG PO TABS: 10.0000 mg | ORAL_TABLET | Freq: Every day | ORAL | 0 refills | Status: DC

## 2019-07-23 MED ORDER — GLIPIZIDE ER 5 MG PO TB24: 5.0000 mg | ORAL_TABLET | Freq: Every day | ORAL | 0 refills | Status: DC

## 2019-07-23 NOTE — Telephone Encounter (Signed)
Patient scheduled with PCP for 08/13/2019.

## 2019-07-23 NOTE — Telephone Encounter (Signed)
Overdue for follow-up of his chronic conditions.  Can give 1 month supply, but make sure he schedules before that runs out

## 2019-08-13 ENCOUNTER — Other Ambulatory Visit: Payer: Self-pay

## 2019-08-13 ENCOUNTER — Ambulatory Visit: Payer: 59 | Admitting: Family Medicine

## 2019-08-13 ENCOUNTER — Ambulatory Visit: Payer: No Typology Code available for payment source | Admitting: Physician Assistant

## 2019-08-13 DIAGNOSIS — E1169 Type 2 diabetes mellitus with other specified complication: Secondary | ICD-10-CM

## 2019-08-13 DIAGNOSIS — E785 Hyperlipidemia, unspecified: Secondary | ICD-10-CM

## 2019-08-13 DIAGNOSIS — I10 Essential (primary) hypertension: Secondary | ICD-10-CM

## 2019-08-13 LAB — POCT GLYCOSYLATED HEMOGLOBIN (HGB A1C)
Est. average glucose Bld gHb Est-mCnc: 192
Hemoglobin A1C: 8.3 % — AB (ref 4.0–5.6)

## 2019-08-13 MED ORDER — GLIPIZIDE ER 10 MG PO TB24: 10.0000 mg | ORAL_TABLET | Freq: Every day | ORAL | 1 refills | Status: DC

## 2019-08-13 MED ORDER — AMLODIPINE BESYLATE 10 MG PO TABS: 10.0000 mg | ORAL_TABLET | Freq: Every day | ORAL | 1 refills | Status: DC

## 2019-08-13 MED ORDER — JARDIANCE 25 MG PO TABS: 25.0000 mg | ORAL_TABLET | Freq: Every day | ORAL | 1 refills | Status: DC

## 2019-08-13 MED ORDER — ATORVASTATIN CALCIUM 40 MG PO TABS: 40.0000 mg | ORAL_TABLET | Freq: Every day | ORAL | 1 refills | Status: DC

## 2019-08-13 MED ORDER — LISINOPRIL 40 MG PO TABS: 40.0000 mg | ORAL_TABLET | Freq: Every day | ORAL | 1 refills | Status: DC

## 2019-08-13 NOTE — Progress Notes (Signed)
Established patient visit   Patient: Timothy Douglas   DOB: [birth year]   49 y.o. Male  MRN: [id number] Visit Date: 08/13/2019  Today's healthcare provider: Trinna Post, PA-C   Chief Complaint  Patient presents with  . Diabetes  . Hyperlipidemia  . Hypertension  I,Adriana M Pollak,acting as a scribe for [employer], PA-C.,have documented all relevant documentation on the behalf of Trinna Post, PA-C,as directed by  Trinna Post, PA-C while in the presence of Trinna Post, PA-C.  Subjective    HPI  Diabetes Mellitus Type II, follow-up  Lab Results  Component Value Date   HGBA1C 8.3 (A) 08/13/2019   HGBA1C 7.1 (H) 03/22/2019   HGBA1C 7.7 11/28/2018   Last seen for diabetes 6 months ago.  Management since then includes continuing the same treatment. He reports good compliance with treatment. He is not having side effects.   Home blood sugar records: fasting range: 60's-100's  Episodes of hypoglycemia? No    Current insulin regiment:none Most Recent Eye Exam:   --------------------------------------------------------------------------------------------------- Hypertension, follow-up  BP Readings from Last 3 Encounters:  05/31/19 122/80  05/27/19 109/76  05/25/19 126/82   Wt Readings from Last 3 Encounters:  05/31/19 241 lb (109.3 kg)  05/27/19 236 lb (107 kg)  05/25/19 237 lb 12.8 oz (107.9 kg)     He was last seen for hypertension 6 months ago.  BP at that visit was not checked due to virtual visit. Management since that visit includes no changes. He reports good compliance with treatment. He is not having side effects.  He is exercising. He is adherent to low salt diet.   Outside blood pressures are not checked.  He does not smoke.  Use of agents associated with hypertension: none.   --------------------------------------------------------------------------------------------------- Lipid/Cholesterol, follow-up  Last Lipid  Panel: Lab Results  Component Value Date   CHOL 176 03/22/2019   LDLCALC 110 (H) 03/22/2019   HDL 42 03/22/2019   TRIG 135 03/22/2019    He was last seen for this 6 months ago.  Management since that visit includes no changes.  He reports good compliance with treatment. He is not having side effects.   Symptoms: No appetite changes No foot ulcerations  No chest pain No chest pressure/discomfort  No dyspnea No orthopnea  No fatigue No lower extremity edema  No palpitations No paroxysmal nocturnal dyspnea  No nausea No numbness or tingling of extremity  No polydipsia No polyuria  No speech difficulty No syncope   He is following a Regular diet. Current exercise: walking and weightlifting  Last metabolic panel Lab Results  Component Value Date   GLUCOSE 134 (H) 03/22/2019   NA 139 03/22/2019   K 5.0 03/22/2019   BUN 21 03/22/2019   CREATININE 1.46 (H) 03/22/2019   GFRNONAA 55 (L) 03/22/2019   GFRAA 63 03/22/2019   CALCIUM 10.0 03/22/2019   AST 26 03/22/2019   ALT 32 03/22/2019   The 10-year ASCVD risk score Mikey Bussing DC Jr., et al., 2013) is: 15.6%  ---------------------------------------------------------------------------------------------------      Medications: Outpatient Medications Prior to Visit  Medication Sig  . Ascorbic Acid (VITAMIN C PO) Take by mouth daily.  Marland Kitchen glipiZIDE (GLUCOTROL XL) 5 MG 24 hr tablet Take 1 tablet (5 mg total) by mouth daily. Please schedule office visit before any future refills  . JANUMET XR 50-1000 MG TB24 Take 2 tablets by mouth daily.  . meclizine (ANTIVERT) 25 MG tablet Take 1 tablet (25  mg total) by mouth every 6 (six) hours as needed.  . meloxicam (MOBIC) 15 MG tablet Take 15 mg by mouth daily.  . Multiple Vitamin (MULTIVITAMIN) tablet Take 1 tablet by mouth daily.  . sildenafil (VIAGRA) 100 MG tablet Take 0.5-1 tablets (50-100 mg total) by mouth daily as needed for erectile dysfunction.  . [DISCONTINUED] amLODipine (NORVASC)  10 MG tablet Take 1 tablet (10 mg total) by mouth daily. Please schedule office visit before any future refills  . [DISCONTINUED] atorvastatin (LIPITOR) 40 MG tablet Take 1 tablet (40 mg total) by mouth daily.  . [DISCONTINUED] indomethacin (INDOCIN) 25 MG capsule Take 1 capsule (25 mg total) by mouth 3 (three) times daily as needed.  . [DISCONTINUED] JARDIANCE 25 MG TABS tablet Take 1 tablet (25 mg total) by mouth daily. Please schedule office visit before any future refills  . [DISCONTINUED] lisinopril (ZESTRIL) 40 MG tablet Take 40 mg by mouth daily.  Marland Kitchen amoxicillin (AMOXIL) 875 MG tablet Take 1 tablet (875 mg total) by mouth 2 (two) times daily. (Patient not taking: Reported on 08/13/2019)  . ofloxacin (FLOXIN OTIC) 0.3 % OTIC solution Place 5 drops into the left ear daily. (Patient not taking: Reported on 08/13/2019)   No facility-administered medications prior to visit.    Review of Systems  Constitutional: Negative.   Respiratory: Negative.   Cardiovascular: Negative.   Hematological: Negative.       Objective    There were no vitals taken for this visit.   Physical Exam Constitutional:      Appearance: Normal appearance.  Cardiovascular:     Rate and Rhythm: Normal rate and regular rhythm.     Pulses: Normal pulses.     Heart sounds: Normal heart sounds.  Pulmonary:     Effort: Pulmonary effort is normal.     Breath sounds: Normal breath sounds.  Feet:     Comments: Normal foot exam Skin:    General: Skin is warm and dry.  Neurological:     General: No focal deficit present.     Mental Status: He is alert and oriented to person, place, and time.  Psychiatric:        Mood and Affect: Mood normal.        Behavior: Behavior normal.       Results for orders placed or performed in visit on 08/13/19  POCT glycosylated hemoglobin (Hb A1C)  Result Value Ref Range   Hemoglobin A1C 8.3 (A) 4.0 - 5.6 %   HbA1c POC (<> result, manual entry)     HbA1c, POC (prediabetic  range)     HbA1c, POC (controlled diabetic range)     Est. average glucose Bld gHb Est-mCnc 192     Assessment & Plan    1. Type 2 diabetes mellitus with other specified complication, without long-term current use of insulin (HCC)  Uncontrolled. Patient adamantly opposed to injectables though I counseled this will most reliably lower his A1C. Increase glipizide for now but counseled this may not have the a1c lowering effect he needs.  - POCT glycosylated hemoglobin (Hb A1C) - glipiZIDE (GLUCOTROL XL) 10 MG 24 hr tablet; Take 1 tablet (10 mg total) by mouth daily with breakfast.  Dispense: 90 tablet; Refill: 1 - JARDIANCE 25 MG TABS tablet; Take 1 tablet (25 mg total) by mouth daily. Please schedule office visit before any future refills  Dispense: 90 tablet; Refill: 1  2. Essential hypertension - amLODipine (NORVASC) 10 MG tablet; Take 1 tablet (10 mg total)  by mouth daily. Please schedule office visit before any future refills  Dispense: 90 tablet; Refill: 1 - lisinopril (ZESTRIL) 40 MG tablet; Take 1 tablet (40 mg total) by mouth daily.  Dispense: 90 tablet; Refill: 1  3. Hyperlipidemia due to type 2 diabetes mellitus (HCC)  - atorvastatin (LIPITOR) 40 MG tablet; Take 1 tablet (40 mg total) by mouth daily.  Dispense: 90 tablet; Refill: 1   Return in about 3 months (around 11/13/2019) for dm/htn/hld.      ITrinna Post, PA-C, have reviewed all documentation for this visit. The documentation on 08/17/19 for the exam, diagnosis, procedures, and orders are all accurate and complete.    Paulene Floor  [place] [phone] (phone) [phone] (fax)  [place]

## 2019-08-15 ENCOUNTER — Other Ambulatory Visit: Payer: Self-pay | Admitting: Family Medicine

## 2019-08-15 NOTE — Telephone Encounter (Signed)
Requested Prescriptions  Pending Prescriptions Disp Refills  . indomethacin (INDOCIN) 25 MG capsule [Pharmacy Med Name: INDOMETHACIN 25 MG CAPSULE] 90 capsule 0    Sig: TAKE 1 CAPSULE (25 MG TOTAL) BY MOUTH 3 (THREE) TIMES DAILY AS NEEDED.     Analgesics:  NSAIDS Failed - 08/15/2019  1:14 AM      Failed - Cr in normal range and within 360 days    Creatinine, Ser  Date Value Ref Range Status  03/22/2019 1.46 (H) 0.76 - 1.27 mg/dL Final         Failed - HGB in normal range and within 360 days    Hemoglobin  Date Value Ref Range Status  03/22/2019 17.9 (H) 13.0 - 17.7 g/dL Final         Passed - Patient is not pregnant      Passed - Valid encounter within last 12 months    Recent Outpatient Visits          2 days ago Type 2 diabetes mellitus with other specified complication, without long-term current use of insulin [facility])   [place], Adriana M, PA-C   2 months ago Sensorineural hearing loss (SNHL) of left ear with unrestricted hearing of right ear   [facility], [place], [place]   2 months ago Dysfunction of left eustachian tube   [employer] [facility], [place], [place]   2 months ago Impacted cerumen of left ear   [place], [place], PA-C   6 months ago Type 2 diabetes mellitus with other specified complication, without long-term current use of insulin [facility])   [place], Dionne Bucy, MD      Future Appointments            In 3 months Bacigalupo, Dionne Bucy, MD [facility], [facility]           Last two creatinine levels have been 1.4 and 1.46.

## 2019-09-10 ENCOUNTER — Other Ambulatory Visit: Payer: Self-pay | Admitting: Family Medicine

## 2019-09-21 ENCOUNTER — Telehealth: Payer: Self-pay | Admitting: Family Medicine

## 2019-09-21 ENCOUNTER — Other Ambulatory Visit: Payer: Self-pay | Admitting: Family Medicine

## 2019-09-21 MED ORDER — JANUMET XR 50-1000 MG PO TB24: 2.0000 | ORAL_TABLET | Freq: Every day | ORAL | 5 refills | Status: DC

## 2019-09-21 NOTE — Telephone Encounter (Signed)
Patient called to ask if he could get an alternative medication for sildenafil (VIAGRA) 100 MG tablet, because this one is too expensive and his insurance does not cover it.  Please advise and call patient to confirm at [phone]

## 2019-09-21 NOTE — Telephone Encounter (Signed)
Medication Refill - Medication: JANUMET XR 50-1000 MG TB24    Preferred Pharmacy (with phone number or street name):  [id number] [address] Phone:  [phone]  Fax:  [phone]       Agent: Please be advised that RX refills may take up to 3 business days. We ask that you follow-up with your pharmacy.

## 2019-09-21 NOTE — Telephone Encounter (Signed)
Insurance does not cover any of the erectile dysfunction meds unfortunately.  Recommend using goodRx to look for savings and consider transferring to a cheaper pharmacy.

## 2019-09-21 NOTE — Telephone Encounter (Signed)
Requested medication (s) are due for refill today: Yes  Requested medication (s) are on the active medication list: Yes  Last refill:  12/01/18  Future visit scheduled: Yes  Notes to clinic:  Unable to refill per protocol, last refill by another provider.     Requested Prescriptions  Pending Prescriptions Disp Refills   JANUMET XR 50-1000 MG TB24 30 tablet     Sig: Take 2 tablets by mouth daily.      Endocrinology:  Diabetes - Biguanide + DPP-4 Inhibitor Combos Failed - 09/21/2019 10:14 AM      Failed - HBA1C is between 0 and 7.9 and within 180 days    Hemoglobin A1C  Date Value Ref Range Status  08/13/2019 8.3 (A) 4.0 - 5.6 % Final  11/28/2018 7.7  Final   Hgb A1c MFr Bld  Date Value Ref Range Status  03/22/2019 7.1 (H) 4.8 - 5.6 % Final    Comment:             Prediabetes: 5.7 - 6.4          Diabetes: >6.4          Glycemic control for adults with diabetes: <7.0           Failed - Cr in normal range and within 360 days    Creatinine, Ser  Date Value Ref Range Status  03/22/2019 1.46 (H) 0.76 - 1.27 mg/dL Final          Passed - eGFR in normal range and within 360 days    GFR calc Af Amer  Date Value Ref Range Status  03/22/2019 63 >59 mL/min/1.73 Final   GFR calc non Af Amer  Date Value Ref Range Status  03/22/2019 55 (L) >59 mL/min/1.73 Final          Passed - Valid encounter within last 6 months    Recent Outpatient Visits           1 month ago Type 2 diabetes mellitus with other specified complication, without long-term current use of insulin [facility])   [place], Adriana M, PA-C   3 months ago Sensorineural hearing loss (SNHL) of left ear with unrestricted hearing of right ear   [employer] [facility], [place], [place]   3 months ago Dysfunction of left eustachian tube   [employer], [place], [place]   3 months ago Impacted cerumen of left ear   [place], [place], PA-C    7 months ago Type 2 diabetes mellitus with other specified complication, without long-term current use of insulin [facility])   [place], Dionne Bucy, MD       Future Appointments             In 1 month Bacigalupo, Dionne Bucy, MD [facility], [facility]

## 2019-09-21 NOTE — Telephone Encounter (Signed)
Patient advised as below. Patient verbalizes understanding and is in agreement with treatment plan.  

## 2019-10-03 ENCOUNTER — Other Ambulatory Visit: Payer: Self-pay | Admitting: Family Medicine

## 2019-10-14 ENCOUNTER — Other Ambulatory Visit: Payer: Self-pay | Admitting: Family Medicine

## 2019-11-01 ENCOUNTER — Other Ambulatory Visit: Payer: Self-pay | Admitting: Family Medicine

## 2019-11-18 ENCOUNTER — Ambulatory Visit: Payer: No Typology Code available for payment source | Admitting: Family Medicine

## 2019-11-18 ENCOUNTER — Other Ambulatory Visit: Payer: Self-pay

## 2019-11-18 ENCOUNTER — Encounter: Payer: Self-pay | Admitting: Family Medicine

## 2019-11-18 VITALS — BP 112/71 | HR 91 | Temp 98.3°F | Wt 245.0 lb

## 2019-11-18 DIAGNOSIS — N529 Male erectile dysfunction, unspecified: Secondary | ICD-10-CM

## 2019-11-18 DIAGNOSIS — Z1159 Encounter for screening for other viral diseases: Secondary | ICD-10-CM | POA: Diagnosis not present

## 2019-11-18 DIAGNOSIS — E785 Hyperlipidemia, unspecified: Secondary | ICD-10-CM

## 2019-11-18 DIAGNOSIS — E1169 Type 2 diabetes mellitus with other specified complication: Secondary | ICD-10-CM | POA: Diagnosis not present

## 2019-11-18 DIAGNOSIS — Z23 Encounter for immunization: Secondary | ICD-10-CM | POA: Diagnosis not present

## 2019-11-18 DIAGNOSIS — Z114 Encounter for screening for human immunodeficiency virus [HIV]: Secondary | ICD-10-CM

## 2019-11-18 DIAGNOSIS — E1159 Type 2 diabetes mellitus with other circulatory complications: Secondary | ICD-10-CM

## 2019-11-18 DIAGNOSIS — M7552 Bursitis of left shoulder: Secondary | ICD-10-CM

## 2019-11-18 DIAGNOSIS — I152 Hypertension secondary to endocrine disorders: Secondary | ICD-10-CM

## 2019-11-18 MED ORDER — JANUMET XR 50-1000 MG PO TB24
2.0000 | ORAL_TABLET | Freq: Every day | ORAL | 3 refills | Status: DC
Start: 2019-11-18 — End: 2019-12-02

## 2019-11-18 MED ORDER — SILDENAFIL CITRATE 100 MG PO TABS: 50.0000 mg | ORAL_TABLET | Freq: Every day | ORAL | 11 refills | Status: DC | PRN

## 2019-11-18 MED ORDER — MELOXICAM 15 MG PO TABS: 15.0000 mg | ORAL_TABLET | Freq: Every day | ORAL | 0 refills | Status: DC

## 2019-11-18 NOTE — Assessment & Plan Note (Signed)
Longstanding and intermittent issue, but new diagnosis Exam and history are consistent with bursitis Trial of NSAID and home exercise program If not improving, consider corticosteroid injection

## 2019-11-18 NOTE — Progress Notes (Signed)
Established patient visit   Patient: Timothy Douglas   DOB: 12/03/1970   49 y.o. Male  MRN: [id number] Visit Date: 11/18/2019  Today's healthcare provider: Lavon Paganini, MD   Chief Complaint  Patient presents with  . Diabetes  . Hyperlipidemia  . Hypertension  . Shoulder Pain   Subjective    Shoulder Pain  The pain is present in the left shoulder. This is a chronic problem. Pertinent negatives include no fever, inability to bear weight, itching, joint locking, joint swelling, limited range of motion, numbness, stiffness or tingling.  ongoing for a few years - intermittently Due to repetitive motion Worse with internal rotation Feels like if it could pop it would feel better   Diabetes Mellitus Type II, follow-up  Lab Results  Component Value Date   HGBA1C 8.3 (A) 08/13/2019   HGBA1C 7.1 (H) 03/22/2019   HGBA1C 7.7 11/28/2018   Last seen for diabetes 3 months ago.  Management since then includes increasing glipizide to 10mg  daily He reports excellent compliance with treatment. He is not having side effects.   Home blood sugar records: Pt reports only checking his blood sugars occasionally  Episodes of hypoglycemia? No    Current insulin regiment: none Most Recent Eye Exam: was earlier this year.   --------------------------------------------------------------------------------------------------- Hypertension, follow-up  BP Readings from Last 3 Encounters:  11/18/19 112/71  05/31/19 122/80  05/27/19 109/76   Wt Readings from Last 3 Encounters:  11/18/19 245 lb (111.1 kg)  05/31/19 241 lb (109.3 kg)  05/27/19 236 lb (107 kg)     He was last seen for hypertension 3 months ago.  He reports excellent compliance with treatment. He is not having side effects.  He is exercising. He is adherent to low salt diet.   Outside blood pressures are not being checked.  He does not smoke.  Use of agents associated with hypertension: none.    --------------------------------------------------------------------------------------------------- Lipid/Cholesterol, follow-up  Last Lipid Panel: Lab Results  Component Value Date   CHOL 176 03/22/2019   LDLCALC 110 (H) 03/22/2019   HDL 42 03/22/2019   TRIG 135 03/22/2019    He was last seen for this 3 months ago.  Management since that visit includes no changes.  He reports excellent compliance with treatment. He is not having side effects.   Symptoms: No appetite changes No foot ulcerations  No chest pain No chest pressure/discomfort  No dyspnea No orthopnea  No fatigue No lower extremity edema  No palpitations No paroxysmal nocturnal dyspnea  No nausea No numbness or tingling of extremity  No polydipsia No polyuria  No speech difficulty No syncope   He is following a Low Sodium diet.  Last metabolic panel Lab Results  Component Value Date   GLUCOSE 134 (H) 03/22/2019   NA 139 03/22/2019   K 5.0 03/22/2019   BUN 21 03/22/2019   CREATININE 1.46 (H) 03/22/2019   GFRNONAA 55 (L) 03/22/2019   GFRAA 63 03/22/2019   CALCIUM 10.0 03/22/2019   AST 26 03/22/2019   ALT 32 03/22/2019   The 10-year ASCVD risk score Mikey Bussing DC Jr., et al., 2013) is: 13.4%  ---------------------------------------------------------------------------------------------------   Patient Active Problem List   Diagnosis Date Noted  . Bursitis of left shoulder 11/18/2019  . Special screening for malignant neoplasms, colon   . Polyp of transverse colon   . Family history of thyroid disease 02/05/2019  . Family history of leukemia 02/05/2019  . Decreased libido 02/05/2019  . Erectile  dysfunction 02/05/2019  . T2DM (type 2 diabetes mellitus) ([place])   . Hypertension associated with diabetes ([place])   . Hyperlipidemia due to type 2 diabetes mellitus ([place])   . Spondylolisthesis of lumbar region 04/08/2014   Past Medical History:  Diagnosis Date  . DJD (degenerative joint disease)   .  Essential hypertension   . Hyperlipidemia due to type 2 diabetes mellitus ([place])   . Shoulder pain   . T2DM (type 2 diabetes mellitus) ([place])   . Vertigo    1 episode over 10 yrs ago   Social History   Tobacco Use  . Smoking status: Never Smoker  . Smokeless tobacco: Never Used  Vaping Use  . Vaping Use: Never used  Substance Use Topics  . Alcohol use: Yes    Alcohol/week: 2.0 - 3.0 standard drinks    Types: 1 - 2 Glasses of wine, 1 Cans of beer per week  . Drug use: Never   Allergies  Allergen Reactions  . Tape      Medications: Outpatient Medications Prior to Visit  Medication Sig  . amLODipine (NORVASC) 10 MG tablet Take 1 tablet (10 mg total) by mouth daily. Please schedule office visit before any future refills  . Ascorbic Acid (VITAMIN C PO) Take by mouth daily.  Marland Kitchen atorvastatin (LIPITOR) 40 MG tablet Take 1 tablet (40 mg total) by mouth daily.  Marland Kitchen glipiZIDE (GLUCOTROL XL) 10 MG 24 hr tablet Take 1 tablet (10 mg total) by mouth daily with breakfast.  . indomethacin (INDOCIN) 25 MG capsule TAKE 1 CAPSULE (25 MG TOTAL) BY MOUTH 3 (THREE) TIMES DAILY AS NEEDED.  Marland Kitchen JARDIANCE 25 MG TABS tablet Take 1 tablet (25 mg total) by mouth daily. Please schedule office visit before any future refills  . lisinopril (ZESTRIL) 40 MG tablet Take 1 tablet (40 mg total) by mouth daily.  . meclizine (ANTIVERT) 25 MG tablet Take 1 tablet (25 mg total) by mouth every 6 (six) hours as needed.  . Multiple Vitamin (MULTIVITAMIN) tablet Take 1 tablet by mouth daily.  . [DISCONTINUED] JANUMET XR 50-1000 MG TB24 Take 2 tablets by mouth daily.  . [DISCONTINUED] meloxicam (MOBIC) 15 MG tablet Take 15 mg by mouth daily.  . [DISCONTINUED] ofloxacin (FLOXIN OTIC) 0.3 % OTIC solution Place 5 drops into the left ear daily.  . [DISCONTINUED] sildenafil (VIAGRA) 100 MG tablet Take 0.5-1 tablets (50-100 mg total) by mouth daily as needed for erectile dysfunction.  . [DISCONTINUED] amoxicillin (AMOXIL) 875 MG  tablet Take 1 tablet (875 mg total) by mouth 2 (two) times daily. (Patient not taking: Reported on 08/13/2019)  . [DISCONTINUED] glipiZIDE (GLUCOTROL XL) 5 MG 24 hr tablet TAKE 1 TABLET (5 MG TOTAL) BY MOUTH DAILY. PLEASE SCHEDULE OFFICE VISIT BEFORE ANY FUTURE REFILLS   No facility-administered medications prior to visit.    Review of Systems  Constitutional: Negative for fever.  Musculoskeletal: Negative for stiffness.  Skin: Negative for itching.  Neurological: Negative for tingling and numbness.      Objective    BP 112/71 (BP Location: Right Arm, Patient Position: Sitting, Cuff Size: Large)   Pulse 91   Temp 98.3 F (36.8 C) (Oral)   Wt 245 lb (111.1 kg)   BMI 30.62 kg/m    Physical Exam Vitals reviewed.  Constitutional:      General: He is not in acute distress.    Appearance: Normal appearance. He is not diaphoretic.  HENT:     Head: Normocephalic and atraumatic.  Eyes:  General: No scleral icterus.    Conjunctiva/sclera: Conjunctivae normal.  Cardiovascular:     Rate and Rhythm: Normal rate and regular rhythm.     Pulses: Normal pulses.     Heart sounds: Normal heart sounds. No murmur heard.   Pulmonary:     Effort: Pulmonary effort is normal. No respiratory distress.     Breath sounds: Normal breath sounds. No wheezing or rhonchi.  Abdominal:     General: There is no distension.     Palpations: Abdomen is soft.     Tenderness: There is no abdominal tenderness.  Musculoskeletal:     Left shoulder: No swelling, deformity, effusion, tenderness or bony tenderness. Normal range of motion.     Cervical back: Neck supple.     Right lower leg: No edema.     Left lower leg: No edema.     Comments: Positive Hawkins, pain with internal rotation and full flexion and abduction  Lymphadenopathy:     Cervical: No cervical adenopathy.  Skin:    General: Skin is warm and dry.     Capillary Refill: Capillary refill takes less than 2 seconds.     Findings: No rash.   Neurological:     Mental Status: He is alert and oriented to person, place, and time.     Cranial Nerves: No cranial nerve deficit.  Psychiatric:        Mood and Affect: Mood normal.        Behavior: Behavior normal.       No results found for any visits on 11/18/19.  Assessment & Plan     Problem List Items Addressed This Visit      Cardiovascular and Mediastinum   Hypertension associated with diabetes ([place])    Well controlled Continue current medications Recheck metabolic panel F/u in 3-6 months       Relevant Medications   sildenafil (VIAGRA) 100 MG tablet   JANUMET XR 50-1000 MG TB24     Endocrine   T2DM (type 2 diabetes mellitus) ([place]) - Primary    Previously uncontrolled with last A1c 8.3 Associated with HTN and HLD Recheck A1c Up-to-date on foot exam Up-to-date on vaccines On ACE inhibitor and statin Continue current medications ROI sent for last eye exam Pending A1c, consider medication changes/dose changes Follow-up in 3 months and repeat A1c      Relevant Medications   JANUMET XR 50-1000 MG TB24   Other Relevant Orders   Hemoglobin A1c   Hyperlipidemia due to type 2 diabetes mellitus ([place])    Chronic Previously above goal in the setting of diabetes Recheck FLP and CMP Continue atorvastatin at current dose Goal LDL less than 70      Relevant Medications   sildenafil (VIAGRA) 100 MG tablet   JANUMET XR 50-1000 MG TB24   Other Relevant Orders   Lipid panel   Comprehensive metabolic panel     Musculoskeletal and Integument   Bursitis of left shoulder    Longstanding and intermittent issue, but new diagnosis Exam and history are consistent with bursitis Trial of NSAID and home exercise program If not improving, consider corticosteroid injection        Other   Erectile dysfunction    [facility], it appears he can get 30 tablets of the 100 mg dose for $15 at [place] prescription was sent to [facility] today       Other Visit Diagnoses    Need  for hepatitis C screening test       Relevant Orders   Hepatitis C Antibody   Encounter for screening for HIV       Relevant Orders   HIV antibody (with reflex)   Need for influenza vaccination       Relevant Orders   Flu Vaccine QUAD 6+ mos PF IM (Fluarix Quad PF) (Completed)       Return in about 3 months (around 02/18/2020).      Total time spent on today's visit was greater than 40 minutes, including both face-to-face time and nonface-to-face time personally spent on review of chart (labs and imaging), discussing labs and goals, discussing further work-up, treatment options, referrals to specialist if needed, reviewing outside records of pertinent, answering patient's questions, and coordinating care.    I, Lavon Paganini, MD, have reviewed all documentation for this visit. The documentation on 11/19/19 for the exam, diagnosis, procedures, and orders are all accurate and complete.   Ana [place]ro[place], Dionne Bucy, MD, MPH [facility]

## 2019-11-18 NOTE — Assessment & Plan Note (Signed)
Chronic Previously above goal in the setting of diabetes Recheck FLP and CMP Continue atorvastatin at current dose Goal LDL less than 70

## 2019-11-18 NOTE — Assessment & Plan Note (Signed)
Well controlled Continue current medications Recheck metabolic panel F/u in 3-6 months  

## 2019-11-18 NOTE — Assessment & Plan Note (Signed)
Previously uncontrolled with last A1c 8.3 Associated with HTN and HLD Recheck A1c Up-to-date on foot exam Up-to-date on vaccines On ACE inhibitor and statin Continue current medications ROI sent for last eye exam Pending A1c, consider medication changes/dose changes Follow-up in 3 months and repeat A1c

## 2019-11-18 NOTE — Assessment & Plan Note (Signed)
[facility], it appears he can get 30 tablets of the 100 mg dose for $15 at [place] prescription was sent to Timothy Douglas today

## 2019-11-18 NOTE — Patient Instructions (Signed)
No changes to medications today. We will call with lab results and let you know if we need to make any changes.   Diet Recommendations for Diabetes   Starchy (carb) foods include: Bread, rice, pasta, potatoes, corn, crackers, bagels, muffins, all baked goods.  (Fruits, milk, and yogurt also have carbohydrate, but most of these foods will not spike your blood sugar as the starchy foods will.)  A few fruits do cause high blood sugars; use small portions of bananas (limit to 1/2 at a time), grapes, watermelon, and most tropical fruits.    Protein foods include: Meat, fish, poultry, eggs, dairy foods, and beans such as pinto and kidney beans (beans also provide carbohydrate).   1. Eat at least 3 meals and 1-2 snacks per day. Never go more than 4-5 hours while awake without eating. Eat breakfast within the first hour of getting up.   2. Limit starchy foods to TWO per meal and ONE per snack. ONE portion of a starchy  food is equal to the following:   - ONE slice of bread (or its equivalent, such as half of a hamburger bun).   - 1/2 cup of a "scoopable" starchy food such as potatoes or rice.   - 15 grams of carbohydrate as shown on food label.  3. Include at every meal: a protein food, a carb food, and vegetables and/or fruit.   - Obtain twice as many veg's as protein or carbohydrate foods for both lunch and dinner.   - Fresh or frozen veg's are best.   - Try to keep frozen veg's on hand for a quick vegetable serving.

## 2019-11-19 ENCOUNTER — Ambulatory Visit: Payer: No Typology Code available for payment source | Admitting: Family Medicine

## 2019-11-24 LAB — LIPID PANEL
Chol/HDL Ratio: 4.1 ratio (ref 0.0–5.0)
Cholesterol, Total: 142 mg/dL (ref 100–199)
HDL: 35 mg/dL — ABNORMAL LOW (ref >39)
LDL Chol Calc (NIH): 89 mg/dL (ref 0–99)
Triglycerides: 97 mg/dL (ref 0–149)
VLDL Cholesterol Cal: 18 mg/dL (ref 5–40)

## 2019-11-24 LAB — COMPREHENSIVE METABOLIC PANEL
ALT: 20 IU/L (ref 0–44)
AST: 21 IU/L (ref 0–40)
Albumin/Globulin Ratio: 1.9 (ref 1.2–2.2)
Albumin: 5 g/dL — ABNORMAL HIGH (ref 3.8–4.9)
Alkaline Phosphatase: 114 IU/L (ref 44–121)
BUN/Creatinine Ratio: 16 (ref 9–20)
BUN: 31 mg/dL — ABNORMAL HIGH (ref 6–24)
Bilirubin Total: 0.5 mg/dL (ref 0.0–1.2)
CO2: 19 mmol/L — ABNORMAL LOW (ref 20–29)
Calcium: 9.7 mg/dL (ref 8.7–10.2)
Chloride: 101 mmol/L (ref 96–106)
Creatinine, Ser: 1.94 mg/dL — ABNORMAL HIGH (ref 0.76–1.27)
GFR calc Af Amer: 45 mL/min/{1.73_m2} — ABNORMAL LOW (ref >59)
GFR calc non Af Amer: 39 mL/min/{1.73_m2} — ABNORMAL LOW (ref >59)
Globulin, Total: 2.6 g/dL (ref 1.5–4.5)
Glucose: 199 mg/dL — ABNORMAL HIGH (ref 65–99)
Potassium: 5.1 mmol/L (ref 3.5–5.2)
Sodium: 136 mmol/L (ref 134–144)
Total Protein: 7.6 g/dL (ref 6.0–8.5)

## 2019-11-24 LAB — HEMOGLOBIN A1C
Est. average glucose Bld gHb Est-mCnc: 249 mg/dL
Hgb A1c MFr Bld: 10.3 % — ABNORMAL HIGH (ref 4.8–5.6)

## 2019-11-24 LAB — HEPATITIS C ANTIBODY: Hep C Virus Ab: <0.1 s/co ratio (ref 0.0–0.9)

## 2019-11-24 LAB — HIV ANTIBODY (ROUTINE TESTING W REFLEX): HIV Screen 4th Generation wRfx: NONREACTIVE

## 2019-11-29 ENCOUNTER — Telehealth: Payer: Self-pay

## 2019-11-29 DIAGNOSIS — E785 Hyperlipidemia, unspecified: Secondary | ICD-10-CM

## 2019-11-29 DIAGNOSIS — E1169 Type 2 diabetes mellitus with other specified complication: Secondary | ICD-10-CM

## 2019-11-29 MED ORDER — ATORVASTATIN CALCIUM 80 MG PO TABS: 80.0000 mg | ORAL_TABLET | Freq: Every day | ORAL | 1 refills | Status: DC

## 2019-11-29 NOTE — Progress Notes (Signed)
Ok to send in atorvastatin 80mg  daily #90 r3

## 2019-11-29 NOTE — Telephone Encounter (Signed)
-----   Message from Virginia Crews, MD sent at 11/29/2019  2:06 PM EDT ----- Ok to send in atorvastatin 80mg  daily #90 r3

## 2019-12-02 ENCOUNTER — Other Ambulatory Visit: Payer: Self-pay | Admitting: Family Medicine

## 2019-12-02 MED ORDER — JANUMET XR 50-1000 MG PO TB24
2.0000 | ORAL_TABLET | Freq: Every day | ORAL | 1 refills | Status: DC
Start: 2019-12-02 — End: 2020-03-02

## 2019-12-02 NOTE — Telephone Encounter (Signed)
Pt need a new refill sent here  JANUMET XR 50-1000 MG TB24 [[id number]]  [id number] [place]  [address] [place] [address]  Phone: [phone] Fax: [phone]    Please can cel the request, do to a hi cost  [place], [place]  [place] [address]  Phone: [phone] Fax: [phone]

## 2019-12-14 ENCOUNTER — Other Ambulatory Visit: Payer: Self-pay | Admitting: Family Medicine

## 2019-12-14 NOTE — Telephone Encounter (Signed)
Requested medications are due for refill today yes  Requested medications are on the active medication list yes  Last refill 11/18/19  Last visit 10/2019  Future visit scheduled 01/2020  Notes to clinic Unclear if this med  was to be continued, please assess.

## 2019-12-28 ENCOUNTER — Encounter: Payer: Self-pay | Admitting: Family Medicine

## 2019-12-28 ENCOUNTER — Other Ambulatory Visit: Payer: Self-pay

## 2019-12-28 ENCOUNTER — Ambulatory Visit: Payer: No Typology Code available for payment source | Admitting: Family Medicine

## 2019-12-28 VITALS — BP 124/84 | HR 76 | Temp 98.7°F | Wt 249.0 lb

## 2019-12-28 DIAGNOSIS — M7552 Bursitis of left shoulder: Secondary | ICD-10-CM

## 2019-12-28 NOTE — Progress Notes (Signed)
Established patient visit   Patient: Timothy Douglas   DOB: 21-Jan-1971   49 y.o. Male  MRN: [id number] Visit Date: 12/28/2019  Today's healthcare provider: Lavon Paganini, MD   Chief Complaint  Patient presents with  . Shoulder Pain   Subjective    HPI  Follow up for left shoulder pain  The patient was last seen for this 1 months ago.      Changes made at last visit include Trial of NSAID and home exercise program.   He reports excellent compliance with treatment. He feels that condition is Unchanged. He is not having side effects. Some improvement from meloxicam, but still hurting with lifting, overhead motion and rolling onto that side in his sleep.  -----------------------------------------------------------------------------------------    Patient Active Problem List   Diagnosis Date Noted  . Bursitis of left shoulder 11/18/2019  . Special screening for malignant neoplasms, colon   . Polyp of transverse colon   . Family history of thyroid disease 02/05/2019  . Family history of leukemia 02/05/2019  . Decreased libido 02/05/2019  . Erectile dysfunction 02/05/2019  . T2DM (type 2 diabetes mellitus) ([place])   . Hypertension associated with diabetes ([place])   . Hyperlipidemia due to type 2 diabetes mellitus ([place])   . Spondylolisthesis of lumbar region 04/08/2014   Social History   Tobacco Use  . Smoking status: Never Smoker  . Smokeless tobacco: Never Used  Vaping Use  . Vaping Use: Never used  Substance Use Topics  . Alcohol use: Yes    Alcohol/week: 2.0 - 3.0 standard drinks    Types: 1 - 2 Glasses of wine, 1 Cans of beer per week  . Drug use: Never   Allergies  Allergen Reactions  . Other Rash  . Tape        Medications: Outpatient Medications Prior to Visit  Medication Sig  . amLODipine (NORVASC) 10 MG tablet Take 1 tablet (10 mg total) by mouth daily. Please schedule office visit before any future refills  . Ascorbic Acid (VITAMIN C PO) Take  by mouth daily.  Marland Kitchen atorvastatin (LIPITOR) 80 MG tablet Take 1 tablet (80 mg total) by mouth daily.  Marland Kitchen glipiZIDE (GLUCOTROL XL) 10 MG 24 hr tablet Take 1 tablet (10 mg total) by mouth daily with breakfast.  . indomethacin (INDOCIN) 25 MG capsule TAKE 1 CAPSULE (25 MG TOTAL) BY MOUTH 3 (THREE) TIMES DAILY AS NEEDED.  Marland Kitchen JANUMET XR 50-1000 MG TB24 Take 2 tablets by mouth daily.  Marland Kitchen JARDIANCE 25 MG TABS tablet Take 1 tablet (25 mg total) by mouth daily. Please schedule office visit before any future refills  . lisinopril (ZESTRIL) 40 MG tablet Take 1 tablet (40 mg total) by mouth daily.  . meclizine (ANTIVERT) 25 MG tablet Take 1 tablet (25 mg total) by mouth every 6 (six) hours as needed.  . meloxicam (MOBIC) 15 MG tablet TAKE 1 TABLET BY MOUTH EVERY DAY  . Multiple Vitamin (MULTIVITAMIN) tablet Take 1 tablet by mouth daily.  . sildenafil (VIAGRA) 100 MG tablet Take 0.5-1 tablets (50-100 mg total) by mouth daily as needed for erectile dysfunction.   No facility-administered medications prior to visit.    Review of Systems  Constitutional: Negative.   Respiratory: Negative.   Cardiovascular: Negative.   Musculoskeletal: Positive for myalgias and neck pain.    Last CBC Lab Results  Component Value Date   WBC 7.4 03/22/2019   HGB 17.9 (H) 03/22/2019   HCT 53.7 (H) 03/22/2019  MCV 86 03/22/2019   MCH 28.7 03/22/2019   RDW 16.7 (H) 03/22/2019   PLT 231 94/76/5465   Last metabolic panel Lab Results  Component Value Date   GLUCOSE 199 (H) 11/23/2019   NA 136 11/23/2019   K 5.1 11/23/2019   CL 101 11/23/2019   CO2 19 (L) 11/23/2019   BUN 31 (H) 11/23/2019   CREATININE 1.94 (H) 11/23/2019   GFRNONAA 39 (L) 11/23/2019   GFRAA 45 (L) 11/23/2019   CALCIUM 9.7 11/23/2019   PROT 7.6 11/23/2019   ALBUMIN 5.0 (H) 11/23/2019   LABGLOB 2.6 11/23/2019   AGRATIO 1.9 11/23/2019   BILITOT 0.5 11/23/2019   ALKPHOS 114 11/23/2019   AST 21 11/23/2019   ALT 20 11/23/2019   Last lipids Lab  Results  Component Value Date   CHOL 142 11/23/2019   HDL 35 (L) 11/23/2019   LDLCALC 89 11/23/2019   TRIG 97 11/23/2019   CHOLHDL 4.1 11/23/2019      Objective    BP 124/84 (BP Location: Left Arm, Patient Position: Sitting, Cuff Size: Large)   Pulse 76   Temp 98.7 F (37.1 C) (Oral)   Wt 249 lb (112.9 kg)   BMI 31.12 kg/m  BP Readings from Last 3 Encounters:  12/28/19 124/84  11/18/19 112/71  05/31/19 122/80   Wt Readings from Last 3 Encounters:  12/28/19 249 lb (112.9 kg)  11/18/19 245 lb (111.1 kg)  05/31/19 241 lb (109.3 kg)      Physical Exam Vitals reviewed.  Constitutional:      General: He is not in acute distress.    Appearance: Normal appearance. He is not diaphoretic.  Cardiovascular:     Rate and Rhythm: Normal rate and regular rhythm.  Pulmonary:     Effort: Pulmonary effort is normal. No respiratory distress.  Musculoskeletal:     Comments: L Shoulder: Inspection reveals no abnormalities, atrophy or asymmetry. Palpation is normal with no tenderness over AC joint or bicipital groove. Mild TTP over subacromial space. ROM is full in all planes, but elicits pain with overhead motion. Rotator cuff strength normal throughout. positive Hawkin's tests. No labral pathology noted with negative Obrien's, negative clunk and good stability. Normal scapular function observed. No painful arc and no drop arm sign. No apprehension sign   Neurological:     Mental Status: He is alert.       No results found for any visits on 12/28/19.  Assessment & Plan     1. Bursitis of left shoulder - ongoing problem - some improvement with meloxicam and HEP, but still bothersome - agrees to proceed with corticosteroid injection today - return precautions discussed   INJECTION: Patient was given informed consent,. Appropriate time out was taken. Area prepped and draped in usual sterile fashion.  1 cc of depo-medrol 40 mg/ml plus 4 cc of 1% lidocaine without  epinephrine was injected into the left shoulder subacromial space using a(n) posterior approach. The patient tolerated the procedure well. There were no complications. Post procedure instructions were given.    Return if symptoms worsen or fail to improve.      I, Lavon Paganini, MD, have reviewed all documentation for this visit. The documentation on 12/28/19 for the exam, diagnosis, procedures, and orders are all accurate and complete.   Laurisa Sahakian, Dionne Bucy, MD, MPH [facility]

## 2020-01-04 ENCOUNTER — Other Ambulatory Visit: Payer: Self-pay

## 2020-01-04 MED ORDER — METFORMIN HCL 500 MG PO TABS: 1000.0000 mg | ORAL_TABLET | Freq: Every day | ORAL | 3 refills | Status: DC

## 2020-01-04 MED ORDER — SITAGLIPTIN PHOSPHATE 50 MG PO TABS: 50.0000 mg | ORAL_TABLET | Freq: Every day | ORAL | 3 refills | Status: DC

## 2020-01-04 NOTE — Telephone Encounter (Signed)
Copied from [place] [id number]. Topic: General - Inquiry >> Jan 03, 2020  4:32 PM Timothy Douglas D wrote: Reason for CRM: Pt called saying his [name] is getting really expensive  150.00 with a coupon.  He wants to know if there is something else he can take.  [facility] [address]

## 2020-01-04 NOTE — Telephone Encounter (Signed)
It's likely that separate doses of metformin and Januvia may be cheaper than the combo pill. We could try to send these in and see if it works out cheaper for him.

## 2020-01-12 ENCOUNTER — Other Ambulatory Visit: Payer: Self-pay | Admitting: Family Medicine

## 2020-01-12 ENCOUNTER — Other Ambulatory Visit: Payer: Self-pay | Admitting: Physician Assistant

## 2020-01-12 DIAGNOSIS — E785 Hyperlipidemia, unspecified: Secondary | ICD-10-CM

## 2020-01-12 DIAGNOSIS — I10 Essential (primary) hypertension: Secondary | ICD-10-CM

## 2020-01-12 DIAGNOSIS — E1169 Type 2 diabetes mellitus with other specified complication: Secondary | ICD-10-CM

## 2020-02-08 ENCOUNTER — Other Ambulatory Visit: Payer: Self-pay | Admitting: Family Medicine

## 2020-02-24 ENCOUNTER — Encounter: Payer: No Typology Code available for payment source | Admitting: Family Medicine

## 2020-03-02 ENCOUNTER — Ambulatory Visit (INDEPENDENT_AMBULATORY_CARE_PROVIDER_SITE_OTHER): Payer: No Typology Code available for payment source | Admitting: Family Medicine

## 2020-03-02 ENCOUNTER — Encounter: Payer: Self-pay | Admitting: Family Medicine

## 2020-03-02 ENCOUNTER — Telehealth: Payer: Self-pay

## 2020-03-02 ENCOUNTER — Other Ambulatory Visit: Payer: Self-pay

## 2020-03-02 VITALS — BP 135/82 | HR 94 | Temp 98.4°F | Resp 16 | Wt 244.0 lb

## 2020-03-02 DIAGNOSIS — N529 Male erectile dysfunction, unspecified: Secondary | ICD-10-CM

## 2020-03-02 DIAGNOSIS — I152 Hypertension secondary to endocrine disorders: Secondary | ICD-10-CM

## 2020-03-02 DIAGNOSIS — R42 Dizziness and giddiness: Secondary | ICD-10-CM

## 2020-03-02 DIAGNOSIS — Z806 Family history of leukemia: Secondary | ICD-10-CM | POA: Diagnosis not present

## 2020-03-02 DIAGNOSIS — E1159 Type 2 diabetes mellitus with other circulatory complications: Secondary | ICD-10-CM | POA: Diagnosis not present

## 2020-03-02 DIAGNOSIS — E1169 Type 2 diabetes mellitus with other specified complication: Secondary | ICD-10-CM | POA: Diagnosis not present

## 2020-03-02 DIAGNOSIS — Z0000 Encounter for general adult medical examination without abnormal findings: Secondary | ICD-10-CM | POA: Diagnosis not present

## 2020-03-02 DIAGNOSIS — Z8349 Family history of other endocrine, nutritional and metabolic diseases: Secondary | ICD-10-CM

## 2020-03-02 DIAGNOSIS — Z23 Encounter for immunization: Secondary | ICD-10-CM

## 2020-03-02 DIAGNOSIS — E785 Hyperlipidemia, unspecified: Secondary | ICD-10-CM

## 2020-03-02 LAB — POCT GLYCOSYLATED HEMOGLOBIN (HGB A1C)
Est. average glucose Bld gHb Est-mCnc: 157
Hemoglobin A1C: 7.1 % — AB (ref 4.0–5.6)

## 2020-03-02 MED ORDER — TADALAFIL 20 MG PO TABS
10.0000 mg | ORAL_TABLET | ORAL | 11 refills | Status: DC | PRN
Start: 2020-03-02 — End: 2020-03-02

## 2020-03-02 MED ORDER — MECLIZINE HCL 25 MG PO TABS: 25.0000 mg | ORAL_TABLET | Freq: Four times a day (QID) | ORAL | 1 refills | Status: DC | PRN

## 2020-03-02 MED ORDER — TADALAFIL 20 MG PO TABS: 10.0000 mg | ORAL_TABLET | ORAL | 11 refills | Status: DC | PRN

## 2020-03-02 NOTE — Assessment & Plan Note (Signed)
Check annual CBC 

## 2020-03-02 NOTE — Assessment & Plan Note (Signed)
Well controlled Continue current medications Recheck metabolic panel F/u in 3-6 months  

## 2020-03-02 NOTE — Assessment & Plan Note (Signed)
Much improved wwith A1c down from 10.3 to 7.1 Continue to work on lifestyle changes to get to goal of <7 Associated with HTN and HLD UTD on foot exam and vaccines Planning to schedule eye exam On ACEi and statin Continue current meds F/u in 3 months and a1c

## 2020-03-02 NOTE — Assessment & Plan Note (Signed)
Check TSH annually

## 2020-03-02 NOTE — Assessment & Plan Note (Signed)
Previously slightly elevated Continue statin Repeat FLP and CMP Goal LDL < 70

## 2020-03-02 NOTE — Assessment & Plan Note (Signed)
No improvement with viagra Related to medications and chronic medical conditions Trial of cialis Consider urology referral

## 2020-03-02 NOTE — Patient Instructions (Signed)

## 2020-03-02 NOTE — Progress Notes (Signed)
Complete physical exam   Patient: Timothy Douglas   DOB: [birth year]   50 y.o. Male  MRN: [id number] Visit Date: 03/02/2020  Today's healthcare provider: Lavon Paganini, MD   Chief Complaint  Patient presents with  . Annual Exam   Subjective    Timothy Douglas is a 50 y.o. male who presents today for a complete physical exam.  He reports consuming a general diet. Home exercise routine includes walking. He generally feels fairly well. He reports sleeping fairly well. He does have additional problems to discuss today.  HPI  Diabetes Mellitus Type II, Follow-up  Lab Results  Component Value Date   HGBA1C 7.1 (A) 03/02/2020   HGBA1C 10.3 (H) 11/23/2019   HGBA1C 8.3 (A) 08/13/2019   Wt Readings from Last 3 Encounters:  03/02/20 244 lb (110.7 kg)  12/28/19 249 lb (112.9 kg)  11/18/19 245 lb (111.1 kg)   Last seen for diabetes 3 months ago.  Management since then includes recommending he start insulin or switch from Januvia to [place]. Patient declined changing medications, and stated that he would prefer working on improving diet and increasing exercise.Marland Kitchen He reports good compliance with treatment. He is not having side effects.   Home blood sugar records: fasting range: 100-140  Episodes of hypoglycemia? No    Current insulin regiment: none Most Recent Eye Exam: >1 year ago Current exercise: walking Current diet habits: in general, an "unhealthy" diet  Pertinent Labs: Lab Results  Component Value Date   CHOL 142 11/23/2019   HDL 35 (L) 11/23/2019   LDLCALC 89 11/23/2019   TRIG 97 11/23/2019   CHOLHDL 4.1 11/23/2019   Lab Results  Component Value Date   NA 136 11/23/2019   K 5.1 11/23/2019   CREATININE 1.94 (H) 11/23/2019   GFRNONAA 39 (L) 11/23/2019   GFRAA 45 (L) 11/23/2019   GLUCOSE 199 (H) 11/23/2019     --------------------------------------------------------------------------------------------------- Lipid/Cholesterol, Follow-up  Last lipid  panel Other pertinent labs  Lab Results  Component Value Date   CHOL 142 11/23/2019   HDL 35 (L) 11/23/2019   LDLCALC 89 11/23/2019   TRIG 97 11/23/2019   CHOLHDL 4.1 11/23/2019   Lab Results  Component Value Date   ALT 20 11/23/2019   AST 21 11/23/2019   PLT 231 03/22/2019   TSH 1.380 03/22/2019     He was last seen for this 3 months ago.  Management since that visit includes increasing Pravastatin to 80mg  daily.Marland Kitchen  He reports good compliance with treatment. He is not having side effects.   Symptoms: No chest pain No chest pressure/discomfort  No dyspnea No lower extremity edema  No numbness or tingling of extremity No orthopnea  No palpitations No paroxysmal nocturnal dyspnea  No speech difficulty No syncope   Current diet: in general, an "unhealthy" diet Current exercise: walking  The 10-year ASCVD risk score Mikey Bussing DC Brooke Bonito., et al., 2013) is: 19.3%  ---------------------------------------------------------------------------------------------------  Past Medical History:  Diagnosis Date  . DJD (degenerative joint disease)   . Essential hypertension   . Hyperlipidemia due to type 2 diabetes mellitus ([place])   . Shoulder pain   . T2DM (type 2 diabetes mellitus) ([place])   . Vertigo    1 episode over 10 yrs ago   Past Surgical History:  Procedure Laterality Date  . APPENDECTOMY    . COLONOSCOPY WITH PROPOFOL N/A 03/19/2019   Procedure: COLONOSCOPY WITH BIOPSY;  Surgeon: Lucilla Lame, MD;  Location: [facility];  Service: Endoscopy;  Laterality: N/A;  Diabetic - oral meds Priority 4  . EYE SURGERY    . KELOID EXCISION  1990s  . KNEE ARTHROSCOPY Left   . LUMBAR DISC SURGERY    . LUMBAR FUSION     L5-S1  . POLYPECTOMY N/A 03/19/2019   Procedure: POLYPECTOMY;  Surgeon: Lucilla Lame, MD;  Location: [facility];  Service: Endoscopy;  Laterality: N/A;  . SPINAL CORD STIMULATOR IMPLANT     Social History   Socioeconomic History  . Marital status: Married     Spouse name: Diamond Martucci  . Number of children: 3  . Years of education: Not on file  . Highest education level: Not on file  Occupational History  . Not on file  Tobacco Use  . Smoking status: Never Smoker  . Smokeless tobacco: Never Used  Vaping Use  . Vaping Use: Never used  Substance and Sexual Activity  . Alcohol use: Yes    Alcohol/week: 2.0 - 3.0 standard drinks    Types: 1 - 2 Glasses of wine, 1 Cans of beer per week  . Drug use: Never  . Sexual activity: Yes    Partners: Female    Birth control/protection: None  Other Topics Concern  . Not on file  Social History Narrative  . Not on file   Social Determinants of Health   Financial Resource Strain: Not on file  Food Insecurity: Not on file  Transportation Needs: Not on file  Physical Activity: Not on file  Stress: Not on file  Social Connections: Not on file  Intimate Partner Violence: Not on file   Family Status  Relation Name Status  . Mother  (Not Specified)  . Father  (Not Specified)  . Sister  (Not Specified)  . Brother  (Not Specified)  . MGM  (Not Specified)  . MGF  (Not Specified)  . PGM  (Not Specified)  . Mat Uncle  (Not Specified)  . Paternal GGF  (Not Specified)  . Neg Hx  (Not Specified)   Family History  Problem Relation Age of Onset  . Hypertension Mother   . Bell's palsy Mother   . Hypertension Father   . Thyroid disease Sister   . Thyroid disease Brother   . Diabetes Maternal Grandmother   . Leukemia Maternal Grandfather   . Diabetes Paternal Grandmother   . Leukemia Maternal Uncle   . Congestive Heart Failure Paternal Great-grandfather   . Breast cancer Neg Hx   . Prostate cancer Neg Hx   . Colon cancer Neg Hx    Allergies  Allergen Reactions  . Other Rash  . Tape     Patient Care Team: Virginia Crews, MD as PCP - General (Family Medicine)   Medications: Outpatient Medications Prior to Visit  Medication Sig  . amLODipine (NORVASC) 10 MG tablet TAKE 1 TABLET  (10 MG TOTAL) BY MOUTH DAILY. PLEASE SCHEDULE OFFICE VISIT BEFORE ANY FUTURE REFILLS  . Ascorbic Acid (VITAMIN C PO) Take by mouth daily.  Marland Kitchen atorvastatin (LIPITOR) 80 MG tablet Take 1 tablet (80 mg total) by mouth daily.  Marland Kitchen glipiZIDE (GLUCOTROL XL) 10 MG 24 hr tablet TAKE 1 TABLET (10 MG TOTAL) BY MOUTH DAILY WITH BREAKFAST.  . indomethacin (INDOCIN) 25 MG capsule TAKE 1 CAPSULE BY MOUTH 3 TIMES DAILY AS NEEDED.  Marland Kitchen JARDIANCE 25 MG TABS tablet Take 1 tablet (25 mg total) by mouth daily. Please schedule office visit before any future refills  . lisinopril (ZESTRIL) 40 MG tablet TAKE  1 TABLET BY MOUTH EVERY DAY  . meloxicam (MOBIC) 15 MG tablet TAKE 1 TABLET BY MOUTH EVERY DAY  . metFORMIN (GLUCOPHAGE) 500 MG tablet Take 2 tablets (1,000 mg total) by mouth daily with breakfast.  . Multiple Vitamin (MULTIVITAMIN) tablet Take 1 tablet by mouth daily.  . sitaGLIPtin (JANUVIA) 50 MG tablet Take 1 tablet (50 mg total) by mouth daily.  . [DISCONTINUED] meclizine (ANTIVERT) 25 MG tablet Take 1 tablet (25 mg total) by mouth every 6 (six) hours as needed.  . [DISCONTINUED] sildenafil (VIAGRA) 100 MG tablet Take 0.5-1 tablets (50-100 mg total) by mouth daily as needed for erectile dysfunction.  . [DISCONTINUED] atorvastatin (LIPITOR) 40 MG tablet TAKE 1 TABLET BY MOUTH EVERY DAY (Patient not taking: Reported on 03/02/2020)  . [DISCONTINUED] JANUMET XR 50-1000 MG TB24 Take 2 tablets by mouth daily. (Patient not taking: Reported on 03/02/2020)   No facility-administered medications prior to visit.    Review of Systems  Constitutional: Negative for appetite change, chills, fatigue and fever.  HENT: Negative for congestion, ear pain, hearing loss, nosebleeds and trouble swallowing.   Eyes: Negative for pain and visual disturbance.  Respiratory: Negative for cough, chest tightness and shortness of breath.   Cardiovascular: Negative for chest pain, palpitations and leg swelling.  Gastrointestinal: Negative for  abdominal pain, blood in stool, constipation, diarrhea, nausea and vomiting.  Endocrine: Negative for polydipsia, polyphagia and polyuria.  Genitourinary: Negative for dysuria and flank pain.  Musculoskeletal: Negative for arthralgias, back pain, joint swelling, myalgias and neck stiffness.  Skin: Negative for color change, rash and wound.  Neurological: Negative for dizziness, tremors, seizures, speech difficulty, weakness, light-headedness and headaches.  Psychiatric/Behavioral: Negative for behavioral problems, confusion, decreased concentration, dysphoric mood and sleep disturbance. The patient is not nervous/anxious.   All other systems reviewed and are negative.     Objective    BP 135/82 (BP Location: Left Arm, Patient Position: Sitting, Cuff Size: Large)   Pulse 94   Temp 98.4 F (36.9 C) (Temporal)   Resp 16   Wt 244 lb (110.7 kg)   BMI 30.50 kg/m    Physical Exam Vitals reviewed.  Constitutional:      General: He is not in acute distress.    Appearance: Normal appearance. He is well-developed. He is not diaphoretic.  HENT:     Head: Normocephalic and atraumatic.     Right Ear: Tympanic membrane, ear canal and external ear normal.     Left Ear: Tympanic membrane, ear canal and external ear normal.  Eyes:     General: No scleral icterus.    Conjunctiva/sclera: Conjunctivae normal.     Pupils: Pupils are equal, round, and reactive to light.  Neck:     Thyroid: No thyromegaly.  Cardiovascular:     Rate and Rhythm: Normal rate and regular rhythm.     Pulses: Normal pulses.     Heart sounds: Normal heart sounds. No murmur heard.   Pulmonary:     Effort: Pulmonary effort is normal. No respiratory distress.     Breath sounds: Normal breath sounds. No wheezing or rales.  Abdominal:     General: There is no distension.     Palpations: Abdomen is soft.     Tenderness: There is no abdominal tenderness.  Musculoskeletal:        General: No deformity.     Cervical  back: Neck supple.     Right lower leg: No edema.     Left lower leg: No edema.  Lymphadenopathy:     Cervical: No cervical adenopathy.  Skin:    General: Skin is warm and dry.     Findings: No rash.  Neurological:     Mental Status: He is alert and oriented to person, place, and time. Mental status is at baseline.     Sensory: No sensory deficit.     Motor: No weakness.     Gait: Gait normal.  Psychiatric:        Mood and Affect: Mood normal.        Behavior: Behavior normal.        Thought Content: Thought content normal.       Last depression screening scores PHQ 2/9 Scores 03/02/2020 11/18/2019  PHQ - 2 Score 0 0  PHQ- 9 Score 0 0   Last fall risk screening Fall Risk  03/02/2020  Falls in the past year? 0  Number falls in past yr: 0  Injury with Fall? 0  Risk for fall due to : -  Follow up Falls evaluation completed   Last Audit-C alcohol use screening Alcohol Use Disorder Test (AUDIT) 03/02/2020  1. How often do you have a drink containing alcohol? 1  2. How many drinks containing alcohol do you have on a typical day when you are drinking? 0  3. How often do you have six or more drinks on one occasion? 0  AUDIT-C Score 1  Alcohol Brief Interventions/Follow-up AUDIT Score <7 follow-up not indicated   A score of 3 or more in women, and 4 or more in men indicates increased risk for alcohol abuse, EXCEPT if all of the points are from question 1   Results for orders placed or performed in visit on 03/02/20  POCT HgB A1C  Result Value Ref Range   Hemoglobin A1C 7.1 (A) 4.0 - 5.6 %   Est. average glucose Bld gHb Est-mCnc 157     Assessment & Plan    Routine Health Maintenance and Physical Exam  Exercise Activities and Dietary recommendations Goals   None     Immunization History  Administered Date(s) Administered  . Influenza,inj,Quad PF,6+ Mos 11/02/2018, 11/18/2019  . PFIZER(Purple Top)SARS-COV-2 Vaccination 04/30/2019, 09/12/2019, 02/12/2020  . Pneumococcal  Polysaccharide-23 04/13/2008  . Tdap 01/01/2010    Health Maintenance  Topic Date Due  . OPHTHALMOLOGY EXAM  Never done  . TETANUS/TDAP  01/02/2020  . FOOT EXAM  08/12/2020  . HEMOGLOBIN A1C  08/30/2020  . COLONOSCOPY (Pts 45-41yrs Insurance coverage will need to be confirmed)  03/18/2022  . INFLUENZA VACCINE  Completed  . PNEUMOCOCCAL POLYSACCHARIDE VACCINE AGE 70-64 HIGH RISK  Completed  . COVID-19 Vaccine  Completed  . Hepatitis C Screening  Completed  . HIV Screening  Completed    Discussed health benefits of physical activity, and encouraged him to engage in regular exercise appropriate for his age and condition.  Problem List Items Addressed This Visit      Cardiovascular and Mediastinum   Hypertension associated with diabetes ([place])    Well controlled Continue current medications Recheck metabolic panel F/u in 3-6 months       Relevant Medications   tadalafil (CIALIS) 20 MG tablet   Other Relevant Orders   Comprehensive metabolic panel     Endocrine   T2DM (type 2 diabetes mellitus) ([place])    Much improved wwith A1c down from 10.3 to 7.1 Continue to work on lifestyle changes to get to goal of <7 Associated with HTN and HLD UTD on foot exam and  vaccines Planning to schedule eye exam On ACEi and statin Continue current meds F/u in 3 months and a1c      Relevant Orders   POCT HgB A1C (Completed)   Hyperlipidemia due to type 2 diabetes mellitus ([facility])    Previously slightly elevated Continue statin Repeat FLP and CMP Goal LDL < 70       Relevant Medications   tadalafil (CIALIS) 20 MG tablet   Other Relevant Orders   Comprehensive metabolic panel   Lipid panel     Other   Family history of thyroid disease    Check TSH annually      Relevant Orders   TSH   Family history of leukemia    Check annual CBC      Relevant Orders   CBC w/Diff/Platelet   Erectile dysfunction    No improvement with viagra Related to medications and chronic medical  conditions Trial of cialis Consider urology referral       Other Visit Diagnoses    Encounter for annual physical exam    -  Primary   Relevant Orders   TSH   Comprehensive metabolic panel   Lipid panel   CBC w/Diff/Platelet   POCT HgB A1C (Completed)   Vertigo       Relevant Medications   meclizine (ANTIVERT) 25 MG tablet       Return in about 3 months (around 05/30/2020) for chronic disease f/u.     I, Lavon Paganini, MD, have reviewed all documentation for this visit. The documentation on 03/02/20 for the exam, diagnosis, procedures, and orders are all accurate and complete.   Rikayla Demmon, Dionne Bucy, MD, MPH [facility]

## 2020-03-03 LAB — CBC WITH DIFFERENTIAL/PLATELET
Basophils Absolute: 0.1 10*3/uL (ref 0.0–0.2)
Basos: 1 %
EOS (ABSOLUTE): 0.1 10*3/uL (ref 0.0–0.4)
Eos: 1 %
Hematocrit: 52.1 % — ABNORMAL HIGH (ref 37.5–51.0)
Hemoglobin: 17.5 g/dL (ref 13.0–17.7)
Immature Grans (Abs): 0 10*3/uL (ref 0.0–0.1)
Immature Granulocytes: 0 %
Lymphocytes Absolute: 3.6 10*3/uL — ABNORMAL HIGH (ref 0.7–3.1)
Lymphs: 28 %
MCH: 27.4 pg (ref 26.6–33.0)
MCHC: 33.6 g/dL (ref 31.5–35.7)
MCV: 82 fL (ref 79–97)
Monocytes Absolute: 1.1 10*3/uL — ABNORMAL HIGH (ref 0.1–0.9)
Monocytes: 8 %
Neutrophils Absolute: 8 10*3/uL — ABNORMAL HIGH (ref 1.4–7.0)
Neutrophils: 62 %
Platelets: 286 10*3/uL (ref 150–450)
RBC: 6.39 x10E6/uL — ABNORMAL HIGH (ref 4.14–5.80)
RDW: 16.7 % — ABNORMAL HIGH (ref 11.6–15.4)
WBC: 12.8 10*3/uL — ABNORMAL HIGH (ref 3.4–10.8)

## 2020-03-03 LAB — COMPREHENSIVE METABOLIC PANEL
ALT: 49 IU/L — ABNORMAL HIGH (ref 0–44)
AST: 37 IU/L (ref 0–40)
Albumin/Globulin Ratio: 1.6 (ref 1.2–2.2)
Albumin: 5.1 g/dL — ABNORMAL HIGH (ref 3.8–4.9)
Alkaline Phosphatase: 141 IU/L — ABNORMAL HIGH (ref 44–121)
BUN/Creatinine Ratio: 16 (ref 9–20)
BUN: 36 mg/dL — ABNORMAL HIGH (ref 6–24)
Bilirubin Total: 0.8 mg/dL (ref 0.0–1.2)
CO2: 18 mmol/L — ABNORMAL LOW (ref 20–29)
Calcium: 10.1 mg/dL (ref 8.7–10.2)
Chloride: 99 mmol/L (ref 96–106)
Creatinine, Ser: 2.2 mg/dL — ABNORMAL HIGH (ref 0.76–1.27)
GFR calc Af Amer: 38 mL/min/{1.73_m2} — ABNORMAL LOW (ref >59)
GFR calc non Af Amer: 33 mL/min/{1.73_m2} — ABNORMAL LOW (ref >59)
Globulin, Total: 3.1 g/dL (ref 1.5–4.5)
Glucose: 86 mg/dL (ref 65–99)
Potassium: 4.7 mmol/L (ref 3.5–5.2)
Sodium: 136 mmol/L (ref 134–144)
Total Protein: 8.2 g/dL (ref 6.0–8.5)

## 2020-03-03 LAB — LIPID PANEL
Chol/HDL Ratio: 2.9 ratio (ref 0.0–5.0)
Cholesterol, Total: 133 mg/dL (ref 100–199)
HDL: 46 mg/dL (ref >39)
LDL Chol Calc (NIH): 74 mg/dL (ref 0–99)
Triglycerides: 59 mg/dL (ref 0–149)
VLDL Cholesterol Cal: 13 mg/dL (ref 5–40)

## 2020-03-03 LAB — TSH: TSH: 1.02 u[IU]/mL (ref 0.450–4.500)

## 2020-03-09 ENCOUNTER — Other Ambulatory Visit: Payer: Self-pay

## 2020-03-09 ENCOUNTER — Emergency Department
Admission: EM | Admit: 2020-03-09 | Discharge: 2020-03-09 | Disposition: A | Discharge status: Home / Self Care | Payer: No Typology Code available for payment source | Attending: Emergency Medicine | Admitting: Emergency Medicine

## 2020-03-09 ENCOUNTER — Emergency Department: Payer: No Typology Code available for payment source

## 2020-03-09 ENCOUNTER — Encounter: Payer: Self-pay | Admitting: Emergency Medicine

## 2020-03-09 DIAGNOSIS — Z79899 Other long term (current) drug therapy: Secondary | ICD-10-CM | POA: Diagnosis not present

## 2020-03-09 DIAGNOSIS — Z7984 Long term (current) use of oral hypoglycemic drugs: Secondary | ICD-10-CM | POA: Diagnosis not present

## 2020-03-09 DIAGNOSIS — K121 Other forms of stomatitis: Secondary | ICD-10-CM | POA: Diagnosis not present

## 2020-03-09 DIAGNOSIS — K122 Cellulitis and abscess of mouth: Secondary | ICD-10-CM | POA: Insufficient documentation

## 2020-03-09 DIAGNOSIS — E119 Type 2 diabetes mellitus without complications: Secondary | ICD-10-CM | POA: Insufficient documentation

## 2020-03-09 DIAGNOSIS — T280XXA Burn of mouth and pharynx, initial encounter: Secondary | ICD-10-CM

## 2020-03-09 DIAGNOSIS — I10 Essential (primary) hypertension: Secondary | ICD-10-CM | POA: Insufficient documentation

## 2020-03-09 DIAGNOSIS — Z0389 Encounter for observation for other suspected diseases and conditions ruled out: Secondary | ICD-10-CM | POA: Diagnosis not present

## 2020-03-09 DIAGNOSIS — K1230 Oral mucositis (ulcerative), unspecified: Secondary | ICD-10-CM | POA: Insufficient documentation

## 2020-03-09 DIAGNOSIS — K146 Glossodynia: Secondary | ICD-10-CM | POA: Diagnosis present

## 2020-03-09 LAB — COMPREHENSIVE METABOLIC PANEL
ALT: 68 U/L — ABNORMAL HIGH (ref 0–44)
AST: 51 U/L — ABNORMAL HIGH (ref 15–41)
Albumin: 4.8 g/dL (ref 3.5–5.0)
Alkaline Phosphatase: 113 U/L (ref 38–126)
Anion gap: 14 (ref 5–15)
BUN: 36 mg/dL — ABNORMAL HIGH (ref 6–20)
CO2: 21 mmol/L — ABNORMAL LOW (ref 22–32)
Calcium: 10 mg/dL (ref 8.9–10.3)
Chloride: 100 mmol/L (ref 98–111)
Creatinine, Ser: 1.91 mg/dL — ABNORMAL HIGH (ref 0.61–1.24)
GFR, Estimated: 42 mL/min — ABNORMAL LOW (ref >60)
Glucose, Bld: 145 mg/dL — ABNORMAL HIGH (ref 70–99)
Potassium: 5.4 mmol/L — ABNORMAL HIGH (ref 3.5–5.1)
Sodium: 135 mmol/L (ref 135–145)
Total Bilirubin: 1.1 mg/dL (ref 0.3–1.2)
Total Protein: 9.1 g/dL — ABNORMAL HIGH (ref 6.5–8.1)

## 2020-03-09 LAB — LACTIC ACID, PLASMA: Lactic Acid, Venous: 1.5 mmol/L (ref 0.5–1.9)

## 2020-03-09 LAB — CBC WITH DIFFERENTIAL/PLATELET
Abs Immature Granulocytes: 0.04 10*3/uL (ref 0.00–0.07)
Basophils Absolute: 0.1 10*3/uL (ref 0.0–0.1)
Basophils Relative: 0 %
Eosinophils Absolute: 0.1 10*3/uL (ref 0.0–0.5)
Eosinophils Relative: 0 %
HCT: 51.4 % (ref 39.0–52.0)
Hemoglobin: 18.2 g/dL — ABNORMAL HIGH (ref 13.0–17.0)
Immature Granulocytes: 0 %
Lymphocytes Relative: 26 %
Lymphs Abs: 3.6 10*3/uL (ref 0.7–4.0)
MCH: 27.7 pg (ref 26.0–34.0)
MCHC: 35.4 g/dL (ref 30.0–36.0)
MCV: 78.4 fL — ABNORMAL LOW (ref 80.0–100.0)
Monocytes Absolute: 1 10*3/uL (ref 0.1–1.0)
Monocytes Relative: 7 %
Neutro Abs: 9 10*3/uL — ABNORMAL HIGH (ref 1.7–7.7)
Neutrophils Relative %: 67 %
Platelets: 305 10*3/uL (ref 150–400)
RBC: 6.56 MIL/uL — ABNORMAL HIGH (ref 4.22–5.81)
RDW: 15.6 % — ABNORMAL HIGH (ref 11.5–15.5)
WBC: 13.7 10*3/uL — ABNORMAL HIGH (ref 4.0–10.5)
nRBC: 0 % (ref 0.0–0.2)

## 2020-03-09 IMAGING — CT CT MAXILLOFACIAL W/ CM
3 series · 15 of 47 positions shown, 18 images · IV contrast (omnipaque)
Comparison: None.

CLINICAL DATA: Initial evaluation for suspected infection at roof
of mouth.

EXAM:
CT MAXILLOFACIAL WITH CONTRAST
TECHNIQUE: Multidetector CT imaging of the maxillofacial structures was
performed with intravenous contrast. Multiplanar CT image
reconstructions were also generated.
CONTRAST:  60mL OMNIPAQUE IOHEXOL 300 MG/ML  SOLN

[Series 2: max soft · axial · 0.38mm/px · z∈[+550,+714]mm · 9 of 96 slices shown, 12 images]
[im 7/96  brain]
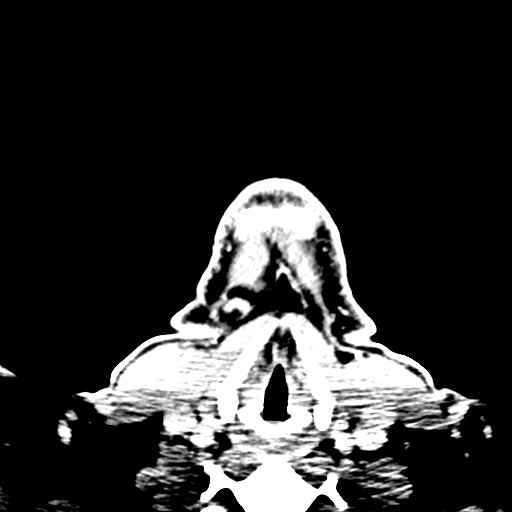
[im 7/96  bone]
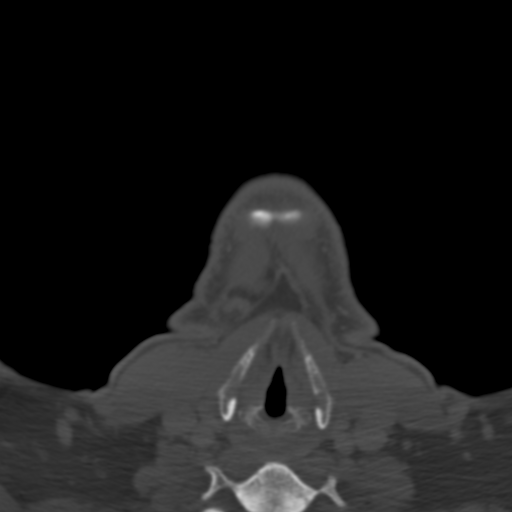
[im 17/96  bone]
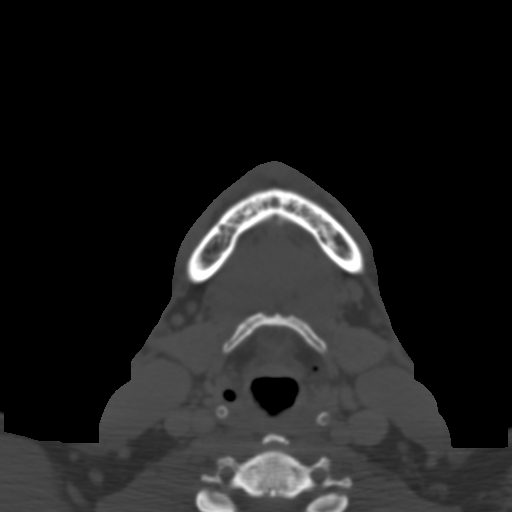
[im 27/96  bone]
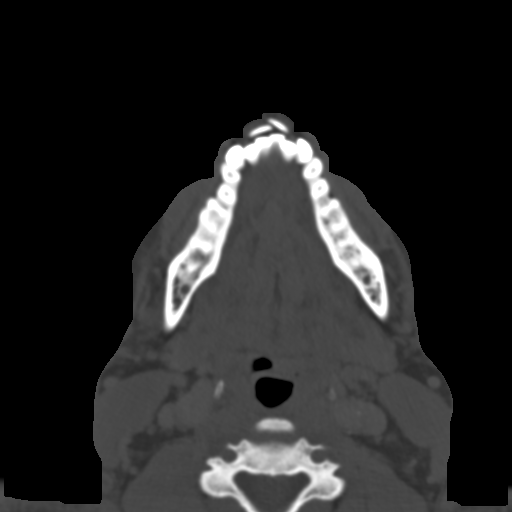
[im 37/96  bone]
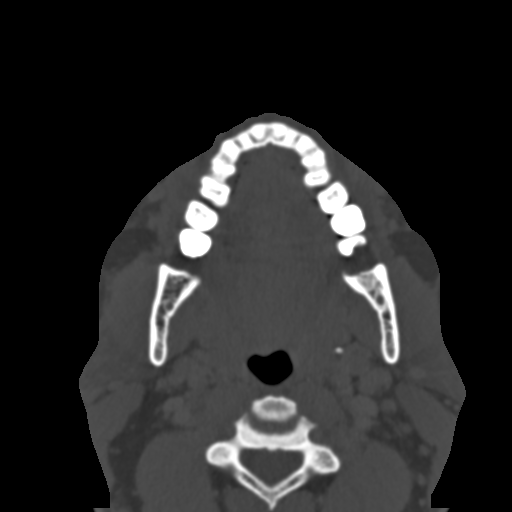
[im 50/96  brain]
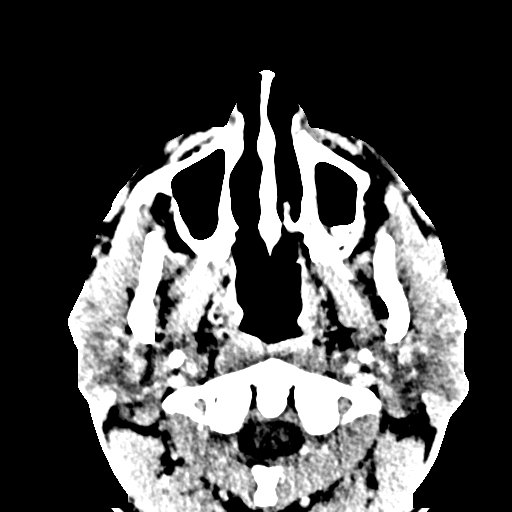
[im 50/96  bone]
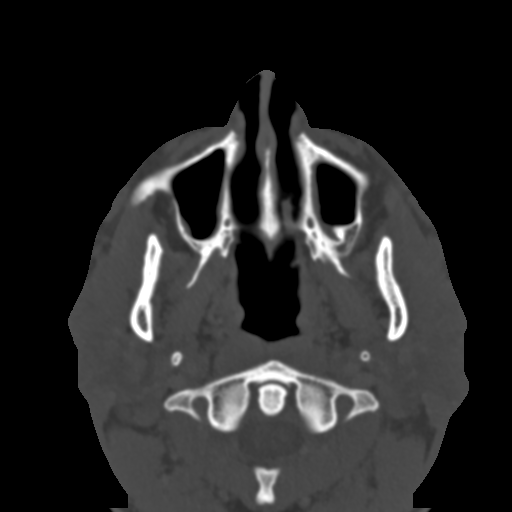
[im 59/96  bone]
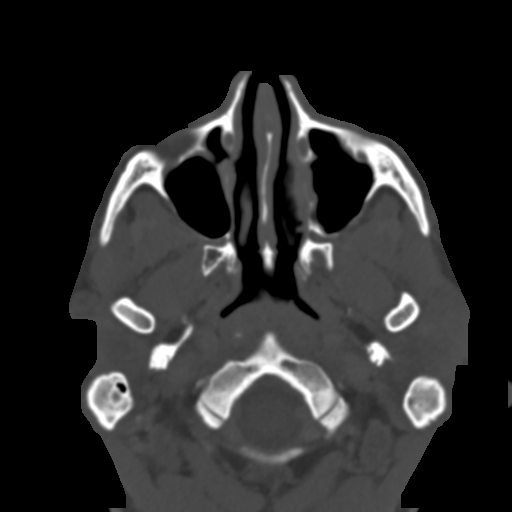
[im 69/96  bone]
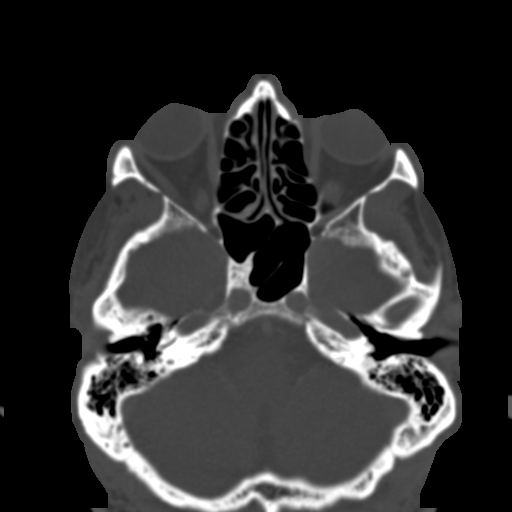
[im 79/96  bone]
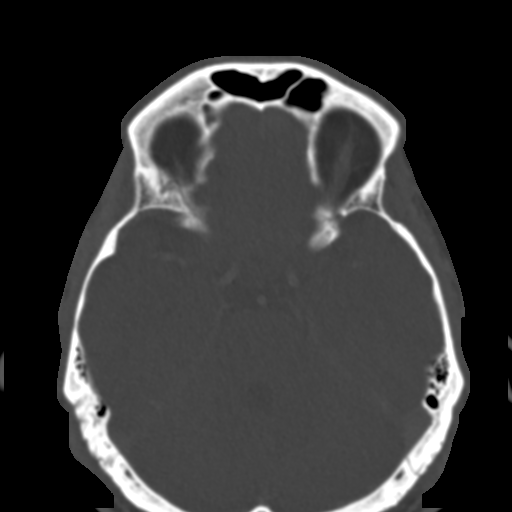
[im 89/96  brain]
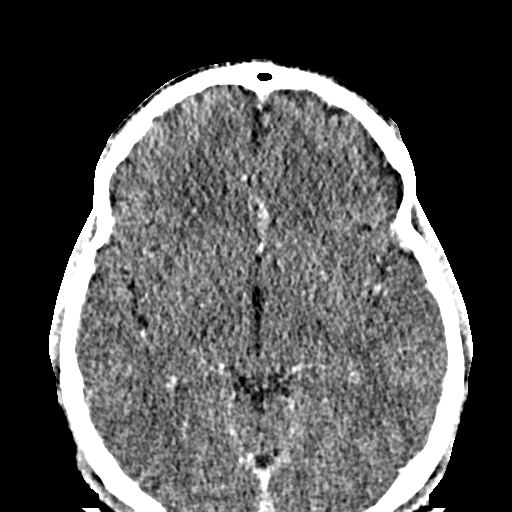
[im 89/96  bone]
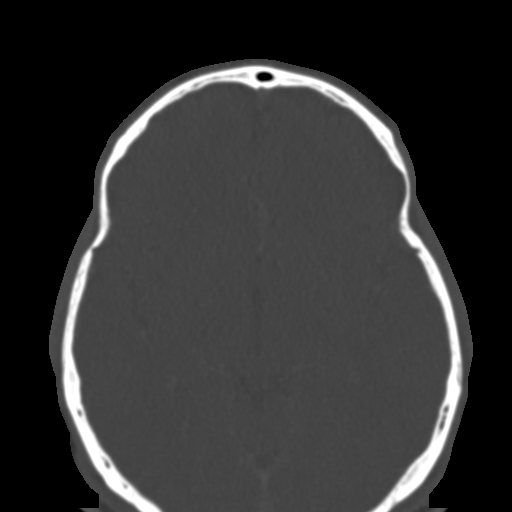

[Series 4: coronal soft · coronal · 0.35mm/px · 3 of 81 slices shown]
[im 27/81  bone]
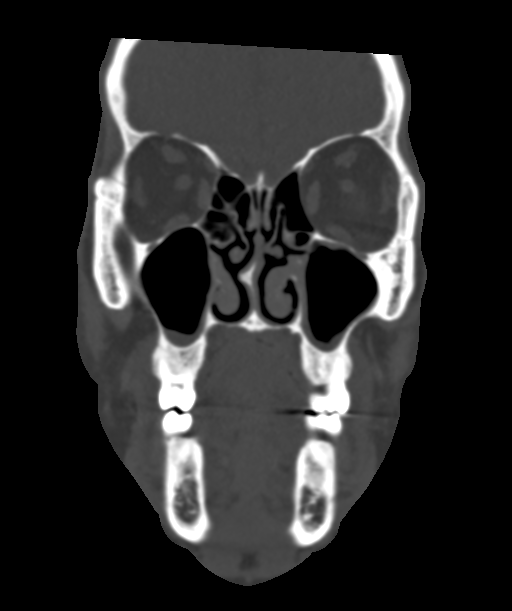
[im 36/81  bone]
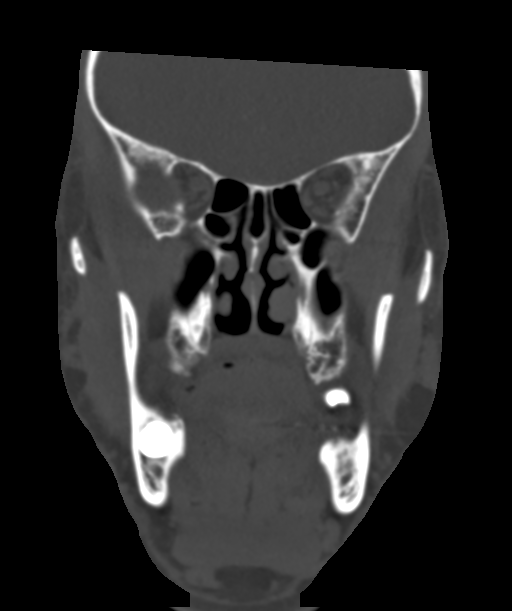
[im 45/81  bone]
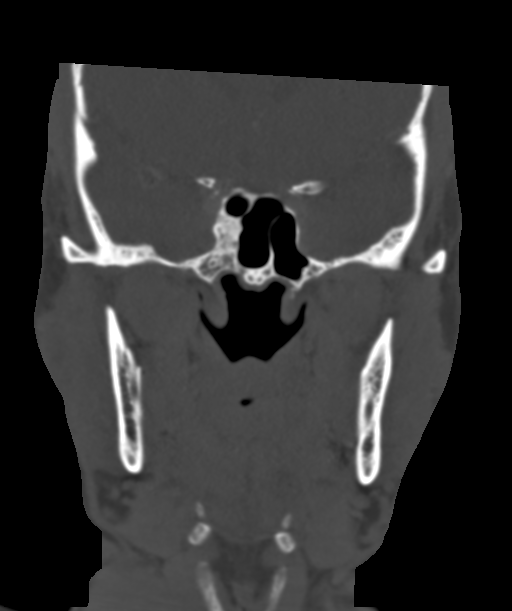

[Series 5: sagittal soft · sagittal · 0.38mm/px · 3 of 86 slices shown]
[im 29/86  bone]
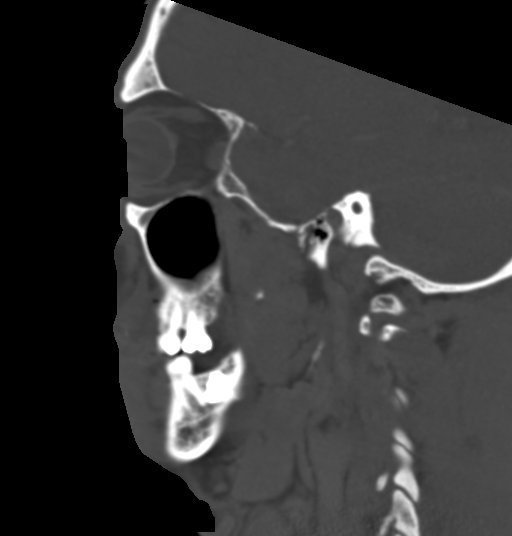
[im 43/86  bone]
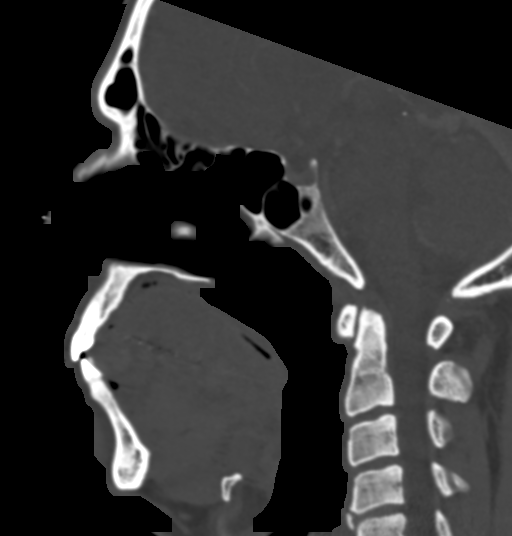
[im 57/86  bone]
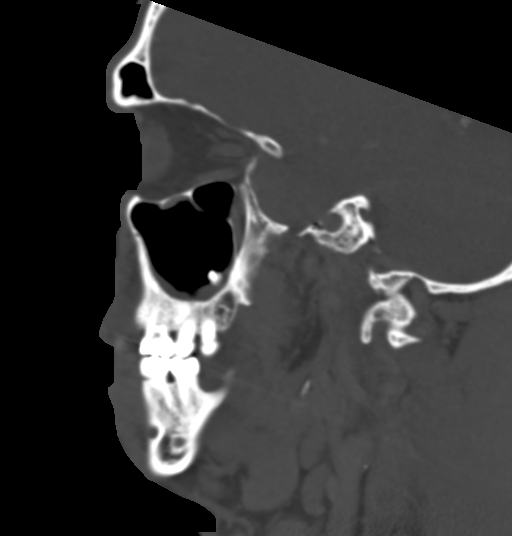

[15 of 47 positions shown; findings below may reference images not displayed]

FINDINGS: Osseous: No acute osseous abnormality seen about the face. No
discrete or worrisome osseous lesions. No significant orthodontic
disease.

Orbits: Globes and orbital soft tissues within normal limits. Globes
and orbital soft tissues within normal limits. No evidence for
intraorbital or postseptal cellulitis.

Sinuses: Mild mucoperiosteal thickening noted about the ethmoidal
air cells and maxillary sinuses, likely allergic/inflammatory
nature. No air-fluid levels to suggest acute sinusitis. Mastoid air
cells and middle ear cavities are well pneumatized and free of
fluid.

Soft tissues: Evaluation of the oral cavity is somewhat limited as
the patient is tongue is lifted and closely apposed to the roof of
mouth. There is question of mild diffuse swelling about the soft
tissues of the soft palate, nonspecific, but could reflect changes
of acute infection given provided history. Changes slightly more
pronounced at the left palate (series 4, image 22). No discrete
abscess or drainable fluid collection. No other inflammatory changes
or swelling seen elsewhere about the soft tissues of the face and
neck. No adenopathy. Salivary glands including the parotid and
submandibular glands are within normal limits.

Limited intracranial: Unremarkable.
IMPRESSION: 1. Question mild diffuse swelling about the soft tissues of the soft
palate/roof of mouth, nonspecific, but could reflect changes of
acute infection given provided history. No discrete abscess or
drainable fluid collection. No evidence for acute inflammatory
changes or infection elsewhere about the face or neck.
2. Mild mucoperiosteal thickening about the ethmoidal air cells and
maxillary sinuses, likely allergic/inflammatory in nature. No
air-fluid levels to suggest acute sinusitis.

## 2020-03-09 MED ORDER — MORPHINE SULFATE (PF) 4 MG/ML IV SOLN
4.0000 mg | Freq: Once | INTRAVENOUS | Status: AC
  Administered 2020-03-09: 4 mg via INTRAVENOUS
  Filled 2020-03-09: qty 1

## 2020-03-09 MED ORDER — SODIUM CHLORIDE 0.9 % IV SOLN
3.0000 g | Freq: Once | INTRAVENOUS | Status: AC
  Administered 2020-03-09: 3 g via INTRAVENOUS
  Filled 2020-03-09: qty 8

## 2020-03-09 MED ORDER — LIDOCAINE VISCOUS HCL 2 % MT SOLN: 5.0000 mL | Freq: Three times a day (TID) | OROMUCOSAL | 0 refills | Status: DC

## 2020-03-09 MED ORDER — OXYCODONE-ACETAMINOPHEN 5-325 MG PO TABS
1.0000 | ORAL_TABLET | Freq: Once | ORAL | Status: AC
Start: 2020-03-09 — End: 2020-03-09
  Administered 2020-03-09: 1 via ORAL
  Filled 2020-03-09: qty 1

## 2020-03-09 MED ORDER — OXYCODONE-ACETAMINOPHEN 5-325 MG PO TABS: 1.0000 | ORAL_TABLET | ORAL | 0 refills | Status: DC | PRN

## 2020-03-09 MED ORDER — LACTATED RINGERS IV BOLUS
1000.0000 mL | Freq: Once | INTRAVENOUS | Status: AC
  Administered 2020-03-09: 1000 mL via INTRAVENOUS

## 2020-03-09 MED ORDER — AMOXICILLIN-POT CLAVULANATE 875-125 MG PO TABS: 1.0000 | ORAL_TABLET | Freq: Two times a day (BID) | ORAL | 0 refills | Status: AC

## 2020-03-09 MED ORDER — IOHEXOL 300 MG/ML  SOLN
60.0000 mL | Freq: Once | INTRAMUSCULAR | Status: AC | PRN
  Administered 2020-03-09: 60 mL via INTRAVENOUS

## 2020-03-09 NOTE — ED Notes (Signed)
Patient transported to CT 

## 2020-03-09 NOTE — ED Triage Notes (Addendum)
Pt in via POV, sent over from [facility] due to potential need of CT scan and debridement.    Pt reports burning roof of mouth on 1/31 w/ hot coffee following soup, doing the same thing the following day on 2/1.  Infection noted to left roof of mouth, significant in size.  Reports increased pain over the last week, in addition to foul smell.  Reports gargling with warm salt water, hydrogen peroxide and using oragel in attempts to manage at home.    Low grade fever, tachycardia noted in triage.  A/Ox4, NAD distress noted.
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
## 2020-03-09 NOTE — ED Notes (Signed)
EDP in room  

## 2020-03-13 ENCOUNTER — Other Ambulatory Visit: Payer: Self-pay

## 2020-03-13 ENCOUNTER — Ambulatory Visit: Payer: No Typology Code available for payment source | Admitting: Family Medicine

## 2020-03-13 ENCOUNTER — Other Ambulatory Visit: Payer: Self-pay | Admitting: Family Medicine

## 2020-03-13 ENCOUNTER — Encounter: Payer: Self-pay | Admitting: Family Medicine

## 2020-03-13 VITALS — BP 120/80 | HR 100 | Temp 98.7°F | Resp 16 | Wt 225.0 lb

## 2020-03-13 DIAGNOSIS — K122 Cellulitis and abscess of mouth: Secondary | ICD-10-CM

## 2020-03-13 MED ORDER — LIDOCAINE VISCOUS HCL 2 % MT SOLN: 5.0000 mL | Freq: Three times a day (TID) | OROMUCOSAL | 0 refills | Status: DC

## 2020-03-13 MED ORDER — OXYCODONE-ACETAMINOPHEN 5-325 MG PO TABS: 1.0000 | ORAL_TABLET | ORAL | 0 refills | Status: AC | PRN

## 2020-03-13 NOTE — Telephone Encounter (Signed)
Requested medication (s) are due for refill today:   Today by Dr. Brita Romp  Requested medication (s) are on the active medication list:   No  Future visit scheduled:   Yes   Last ordered: Today 03/13/2020   See attached pharmacy notice.   They do not compound.  Clinic note:   No protocol assigned to this medication and the pharmacy does not compound anymore.   Requested Prescriptions  Pending Prescriptions Disp Refills   lidocaine (XYLOCAINE) 2 % solution [Pharmacy Med Name: LIDOCAINE 2% VISCOUS SOLN]  0    Sig: Please specify directions, refills and quantity      Off-Protocol Failed - 03/13/2020 12:26 PM      Failed - Medication not assigned to a protocol, review manually.      Passed - Valid encounter within last 12 months    Recent Outpatient Visits           Today Cellulitis of mouth   [facility] [place], Dionne Bucy, MD   1 week ago Encounter for annual physical exam   [facility] [place], Dionne Bucy, MD   2 months ago Bursitis of left shoulder   [facility], Dionne Bucy, MD   3 months ago Type 2 diabetes mellitus with other specified complication, without long-term current use of insulin [facility])   [facility], Dionne Bucy, MD   7 months ago Type 2 diabetes mellitus with other specified complication, without long-term current use of insulin [facility])   [place], Wendee Beavers, [place]       Future Appointments             In 3 months Bacigalupo, Dionne Bucy, MD [facility], [employer]

## 2020-03-13 NOTE — Telephone Encounter (Signed)
Can you see if he wants just lidocaine or for [place] to send to a different pharmacy?
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
## 2020-05-12 ENCOUNTER — Telehealth: Payer: Self-pay

## 2020-05-12 DIAGNOSIS — E1169 Type 2 diabetes mellitus with other specified complication: Secondary | ICD-10-CM

## 2020-05-12 MED ORDER — SITAGLIPTIN PHOSPHATE 50 MG PO TABS: 50.0000 mg | ORAL_TABLET | Freq: Every day | ORAL | 1 refills | Status: DC

## 2020-05-12 MED ORDER — JARDIANCE 25 MG PO TABS: 25.0000 mg | ORAL_TABLET | Freq: Every day | ORAL | 1 refills | Status: DC

## 2020-05-12 NOTE — Telephone Encounter (Signed)
Copied from [place] [id number]. Topic: General - Other >> May 12, 2020  9:45 AM Celene Kras wrote: Reason for CRM: Pt called stating that he is being advised by his insurance company to have is Januvia and Jardiance set as a 90 day supply in order for them to keep covering the medications. Please advise .

## 2020-05-15 ENCOUNTER — Other Ambulatory Visit: Payer: Self-pay | Admitting: Family Medicine

## 2020-05-15 NOTE — Telephone Encounter (Signed)
Requested Prescriptions  Pending Prescriptions Disp Refills  . indomethacin (INDOCIN) 25 MG capsule [Pharmacy Med Name: INDOMETHACIN 25 MG CAPSULE] 90 capsule 2    Sig: TAKE 1 CAPSULE BY MOUTH THREE TIMES A DAY AS NEEDED     Analgesics:  NSAIDS Failed - 05/15/2020  5:30 PM      Failed - Cr in normal range and within 360 days    Creatinine, Ser  Date Value Ref Range Status  03/09/2020 1.91 (H) 0.61 - 1.24 mg/dL Final         Failed - HGB in normal range and within 360 days    Hemoglobin  Date Value Ref Range Status  03/09/2020 18.2 (H) 13.0 - 17.0 g/dL Final  03/02/2020 17.5 13.0 - 17.7 g/dL Final         Passed - Patient is not pregnant      Passed - Valid encounter within last 12 months    Recent Outpatient Visits          2 months ago Cellulitis of mouth   [facility], Dionne Bucy, MD   2 months ago Encounter for annual physical exam   [facility] [place], Dionne Bucy, MD   4 months ago Bursitis of left shoulder   [facility] [place], Dionne Bucy, MD   5 months ago Type 2 diabetes mellitus with other specified complication, without long-term current use of insulin [facility])   [facility], Dionne Bucy, MD   9 months ago Type 2 diabetes mellitus with other specified complication, without long-term current use of insulin [facility])   [place], Wendee Beavers, [place]      Future Appointments            In 1 month Bacigalupo, Dionne Bucy, MD [facility], [place]

## 2020-05-27 ENCOUNTER — Other Ambulatory Visit: Payer: Self-pay | Admitting: Family Medicine

## 2020-05-27 DIAGNOSIS — E1169 Type 2 diabetes mellitus with other specified complication: Secondary | ICD-10-CM

## 2020-05-27 DIAGNOSIS — E785 Hyperlipidemia, unspecified: Secondary | ICD-10-CM

## 2020-05-27 NOTE — Telephone Encounter (Signed)
Requested Prescriptions  Pending Prescriptions Disp Refills  . atorvastatin (LIPITOR) 80 MG tablet [Pharmacy Med Name: ATORVASTATIN 80 MG TABLET] 90 tablet 0    Sig: TAKE 1 TABLET BY MOUTH EVERY DAY     Cardiovascular:  Antilipid - Statins Passed - 05/27/2020  3:04 PM      Passed - Total Cholesterol in normal range and within 360 days    Cholesterol, Total  Date Value Ref Range Status  03/02/2020 133 100 - 199 mg/dL Final         Passed - LDL in normal range and within 360 days    LDL Chol Calc (NIH)  Date Value Ref Range Status  03/02/2020 74 0 - 99 mg/dL Final         Passed - HDL in normal range and within 360 days    HDL  Date Value Ref Range Status  03/02/2020 46 >39 mg/dL Final         Passed - Triglycerides in normal range and within 360 days    Triglycerides  Date Value Ref Range Status  03/02/2020 59 0 - 149 mg/dL Final         Passed - Patient is not pregnant      Passed - Valid encounter within last 12 months    Recent Outpatient Visits          2 months ago Cellulitis of mouth   [facility] [place], Dionne Bucy, MD   2 months ago Encounter for annual physical exam   [facility] [place], Dionne Bucy, MD   5 months ago Bursitis of left shoulder   [facility] [place], Dionne Bucy, MD   6 months ago Type 2 diabetes mellitus with other specified complication, without long-term current use of insulin [facility]   [facility] [place], Dionne Bucy, MD   9 months ago Type 2 diabetes mellitus with other specified complication, without long-term current use of insulin [facility])   [place], Wendee Beavers, [place]      Future Appointments            In 3 weeks Bacigalupo, Dionne Bucy, MD [facility], [facility]

## 2020-06-22 ENCOUNTER — Ambulatory Visit: Payer: No Typology Code available for payment source | Admitting: Family Medicine

## 2020-08-01 DIAGNOSIS — L258 Unspecified contact dermatitis due to other agents: Secondary | ICD-10-CM | POA: Diagnosis not present

## 2020-08-01 DIAGNOSIS — L738 Other specified follicular disorders: Secondary | ICD-10-CM | POA: Diagnosis not present

## 2020-08-01 DIAGNOSIS — L918 Other hypertrophic disorders of the skin: Secondary | ICD-10-CM | POA: Diagnosis not present

## 2020-08-11 ENCOUNTER — Ambulatory Visit: Payer: No Typology Code available for payment source | Admitting: Family Medicine

## 2020-08-11 ENCOUNTER — Other Ambulatory Visit: Payer: Self-pay

## 2020-08-11 ENCOUNTER — Other Ambulatory Visit: Payer: Self-pay | Admitting: Family Medicine

## 2020-08-11 ENCOUNTER — Encounter: Payer: Self-pay | Admitting: Family Medicine

## 2020-08-11 VITALS — BP 138/90 | HR 88 | Resp 16 | Ht 75.0 in | Wt 243.0 lb

## 2020-08-11 DIAGNOSIS — E1169 Type 2 diabetes mellitus with other specified complication: Secondary | ICD-10-CM | POA: Diagnosis not present

## 2020-08-11 DIAGNOSIS — E785 Hyperlipidemia, unspecified: Secondary | ICD-10-CM | POA: Diagnosis not present

## 2020-08-11 DIAGNOSIS — I10 Essential (primary) hypertension: Secondary | ICD-10-CM

## 2020-08-11 LAB — POCT GLYCOSYLATED HEMOGLOBIN (HGB A1C)
Est. average glucose Bld gHb Est-mCnc: 183
Hemoglobin A1C: 8 % — AB (ref 4.0–5.6)

## 2020-08-11 MED ORDER — LISINOPRIL 40 MG PO TABS: 40.0000 mg | ORAL_TABLET | Freq: Every day | ORAL | 1 refills | Status: DC

## 2020-08-11 MED ORDER — GLIPIZIDE ER 10 MG PO TB24: 10.0000 mg | ORAL_TABLET | Freq: Every day | ORAL | 1 refills | Status: DC

## 2020-08-11 MED ORDER — ATORVASTATIN CALCIUM 80 MG PO TABS: 80.0000 mg | ORAL_TABLET | Freq: Every day | ORAL | 1 refills | Status: DC

## 2020-08-11 MED ORDER — AMLODIPINE BESYLATE 10 MG PO TABS: 10.0000 mg | ORAL_TABLET | Freq: Every day | ORAL | 1 refills | Status: DC

## 2020-08-11 MED ORDER — METFORMIN HCL 500 MG PO TABS
ORAL_TABLET | ORAL | 1 refills | Status: DC
Start: 2020-08-11 — End: 2021-01-30

## 2020-08-11 NOTE — Telephone Encounter (Signed)
Notes to clinic: Patient has appt today   Requested Prescriptions  Pending Prescriptions Disp Refills   meloxicam (MOBIC) 15 MG tablet [Pharmacy Med Name: MELOXICAM 15 MG TABLET] 30 tablet 5    Sig: TAKE 1 TABLET BY MOUTH EVERY DAY      Analgesics:  COX2 Inhibitors Failed - 08/11/2020  1:53 AM      Failed - HGB in normal range and within 360 days    Hemoglobin  Date Value Ref Range Status  03/09/2020 18.2 (H) 13.0 - 17.0 g/dL Final  03/02/2020 17.5 13.0 - 17.7 g/dL Final          Failed - Cr in normal range and within 360 days    Creatinine, Ser  Date Value Ref Range Status  03/09/2020 1.91 (H) 0.61 - 1.24 mg/dL Final          Passed - Patient is not pregnant      Passed - Valid encounter within last 12 months    Recent Outpatient Visits           5 months ago Cellulitis of mouth   [facility] [place], Dionne Bucy, MD   5 months ago Encounter for annual physical exam   [facility], Dionne Bucy, MD   7 months ago Bursitis of left shoulder   [facility] [place], Dionne Bucy, MD   8 months ago Type 2 diabetes mellitus with other specified complication, without long-term current use of insulin [facility])   [place], Dionne Bucy, MD   12 months ago Type 2 diabetes mellitus with other specified complication, without long-term current use of insulin [facility])   [place], [place], [place]       Future Appointments             Today Chrismon, Vickki Muff, PA-C [place], [facility]               lisinopril (ZESTRIL) 40 MG tablet [Pharmacy Med Name: LISINOPRIL 40 MG TABLET] 90 tablet 1    Sig: TAKE 1 TABLET BY MOUTH EVERY DAY      Cardiovascular:  ACE Inhibitors Failed - 08/11/2020  1:53 AM      Failed - Cr in normal range and within 180 days    Creatinine, Ser  Date Value Ref Range Status  03/09/2020 1.91 (H) 0.61 - 1.24 mg/dL Final          Failed - K in  normal range and within 180 days    Potassium  Date Value Ref Range Status  03/09/2020 5.4 (H) 3.5 - 5.1 mmol/L Final          Passed - Patient is not pregnant      Passed - Last BP in normal range    BP Readings from Last 1 Encounters:  03/13/20 120/80          Passed - Valid encounter within last 6 months    Recent Outpatient Visits           5 months ago Cellulitis of mouth   [facility] [place], Dionne Bucy, MD   5 months ago Encounter for annual physical exam   [facility] [place], Dionne Bucy, MD   7 months ago Bursitis of left shoulder   [facility] [place], Dionne Bucy, MD   8 months ago Type 2 diabetes mellitus with other specified complication, without long-term current use of insulin [facility])   [facility] [place]igal[place], Dionne Bucy, MD   12 months ago Type 2 diabetes mellitus with other specified complication, without long-term current use of insulin [facility])   [place], Wendee Beavers, [place]       Future Appointments             Today Chrismon, Vickki Muff, PA-C [employer], [facility]               atorvastatin (LIPITOR) 40 MG tablet [employer] Med Name: ATORVASTATIN 40 MG TABLET] 90 tablet 1    Sig: TAKE 1 TABLET BY MOUTH EVERY DAY      Cardiovascular:  Antilipid - Statins Passed - 08/11/2020  1:53 AM      Passed - Total Cholesterol in normal range and within 360 days    Cholesterol, Total  Date Value Ref Range Status  03/02/2020 133 100 - 199 mg/dL Final          Passed - LDL in normal range and within 360 days    LDL Chol Calc (NIH)  Date Value Ref Range Status  03/02/2020 74 0 - 99 mg/dL Final          Passed - HDL in normal range and within 360 days    HDL  Date Value Ref Range Status  03/02/2020 46 >39 mg/dL Final          Passed - Triglycerides in normal range and within 360 days    Triglycerides  Date Value Ref Range Status  03/02/2020 59 0 - 149 mg/dL  Final          Passed - Patient is not pregnant      Passed - Valid encounter within last 12 months    Recent Outpatient Visits           5 months ago Cellulitis of mouth   [facility] [place], Dionne Bucy, MD   5 months ago Encounter for annual physical exam   [facility] [place], Dionne Bucy, MD   7 months ago Bursitis of left shoulder   [facility] [place], Dionne Bucy, MD   8 months ago Type 2 diabetes mellitus with other specified complication, without long-term current use of insulin [facility])   [place], Dionne Bucy, MD   12 months ago Type 2 diabetes mellitus with other specified complication, without long-term current use of insulin [facility])   [place], Wendee Beavers, [place]       Future Appointments             Today Chrismon, Vickki Muff, PA-C [place], [facility]               glipiZIDE (GLUCOTROL XL) 10 MG 24 hr tablet [Pharmacy Med Name: GLIPIZIDE ER 10 MG TABLET] 90 tablet 1    Sig: TAKE 1 TABLET (10 MG TOTAL) BY MOUTH DAILY WITH BREAKFAST.      Endocrinology:  Diabetes - Sulfonylureas Passed - 08/11/2020  1:53 AM      Passed - HBA1C is between 0 and 7.9 and within 180 days    Hemoglobin A1C  Date Value Ref Range Status  03/02/2020 7.1 (A) 4.0 - 5.6 % Final  11/28/2018 7.7  Final   Hgb A1c MFr Bld  Date Value Ref Range Status  11/23/2019 10.3 (H) 4.8 - 5.6 % Final    Comment:             Prediabetes: 5.7 - 6.4  Diabetes: >6.4          Glycemic control for adults with diabetes: <7.0           Passed - Valid encounter within last 6 months    Recent Outpatient Visits           5 months ago Cellulitis of mouth   [facility] [place], Dionne Bucy, MD   5 months ago Encounter for annual physical exam   [facility] [place], Dionne Bucy, MD   7 months ago Bursitis of left shoulder   [facility] [place],  Dionne Bucy, MD   8 months ago Type 2 diabetes mellitus with other specified complication, without long-term current use of insulin [facility])   [facility], Dionne Bucy, MD   12 months ago Type 2 diabetes mellitus with other specified complication, without long-term current use of insulin [facility])   [place], Wendee Bea[place], [place]       Future Appointments             Today Chrismon, Vickki Muff, PA-C [place], [facility]               indomethacin (INDOCIN) 25 MG capsule [Pharmacy Med Name: INDOMETHACIN 25 MG CAPSULE] 90 capsule 2    Sig: TAKE 1 CAPSULE BY MOUTH THREE TIMES A DAY AS NEEDED      Analgesics:  NSAIDS Failed - 08/11/2020  1:53 AM      Failed - Cr in normal range and within 360 days    Creatinine, Ser  Date Value Ref Range Status  03/09/2020 1.91 (H) 0.61 - 1.24 mg/dL Final          Failed - HGB in normal range and within 360 days    Hemoglobin  Date Value Ref Range Status  03/09/2020 18.2 (H) 13.0 - 17.0 g/dL Final  03/02/2020 17.5 13.0 - 17.7 g/dL Final          Passed - Patient is not pregnant      Passed - Valid encounter within last 12 months    Recent Outpatient Visits           5 months ago Cellulitis of mouth   [facility] [place], Dionne Bucy, MD   5 months ago Encounter for annual physical exam   [facility] [place], Dionne Bucy, MD   7 months ago Bursitis of left shoulder   [facility], Dionne Bucy, MD   8 months ago Type 2 diabetes mellitus with other specified complication, without long-term current use of insulin [facility])   [place], Dionne Bucy, MD   12 months ago Type 2 diabetes mellitus with other specified complication, without long-term current use of insulin [facility])   [place], Wendee Bea[place], [place]       Future Appointments             Today Chrismon, Vickki Muff, PA-C [employer],  [facility]

## 2020-08-11 NOTE — Progress Notes (Signed)
I,Timothy Douglas,acting as a Education administrator for [employer], PA-C.,have documented all relevant documentation on the behalf of Timothy Murders, PA-C,as directed by  [employer], PA-C while in the presence of [employer], PA-C.  Established patient visit   Patient: Timothy Douglas   DOB: [birth year]   50 y.o. Male  MRN: [id number] Visit Date: 08/11/2020  Today's healthcare provider: Vernie Murders, PA-C   Chief Complaint  Patient presents with   Follow-up   Diabetes   Subjective    HPI  Diabetes Mellitus Type II, follow-up  Lab Results  Component Value Date   HGBA1C 8.0 (A) 08/11/2020   HGBA1C 7.1 (A) 03/02/2020   HGBA1C 10.3 (H) 11/23/2019   Last seen for diabetes 5 months ago.  Management since then includes continuing the same treatment. He reports good compliance with treatment. He is not having side effects. none  Home blood sugar records: fasting range: checking but can't remember numbers  Episodes of hypoglycemia? No none   Current insulin regiment: n/a Most Recent Eye Exam: advised  ---------------------------------------------------------------------------   Past Medical History:  Diagnosis Date   DJD (degenerative joint disease)    Essential hypertension    Hyperlipidemia due to type 2 diabetes mellitus (HCC)    Shoulder pain    T2DM (type 2 diabetes mellitus) (HCC)    Vertigo    1 episode over 10 yrs ago   Past Surgical History:  Procedure Laterality Date   APPENDECTOMY     COLONOSCOPY WITH PROPOFOL N/A 03/19/2019   Procedure: COLONOSCOPY WITH BIOPSY;  Surgeon: Lucilla Lame, MD;  Location: [place];  Service: Endoscopy;  Laterality: N/A;  Diabetic - oral meds Priority 4   EYE SURGERY     KELOID EXCISION  1990s   KNEE ARTHROSCOPY Left    LUMBAR DISC SURGERY     LUMBAR FUSION     L5-S1   POLYPECTOMY N/A 03/19/2019   Procedure: POLYPECTOMY;  Surgeon: Lucilla Lame, MD;  Location: [place];  Service: Endoscopy;   Laterality: N/A;   SPINAL CORD STIMULATOR IMPLANT     Social History   Tobacco Use   Smoking status: Never   Smokeless tobacco: Never  Vaping Use   Vaping Use: Never used  Substance Use Topics   Alcohol use: Yes    Alcohol/week: 2.0 - 3.0 standard drinks    Types: 1 - 2 Glasses of wine, 1 Cans of beer per week   Drug use: Never   Family Status  Relation Name Status   Mother  (Not Specified)   Father  (Not Specified)   Sister  (Not Specified)   Brother  (Not Specified)   MGM  (Not Specified)   MGF  (Not Specified)   PGM  (Not Specified)   Mat Uncle  (Not Specified)   Paternal GGF  (Not Specified)   Neg Hx  (Not Specified)   Allergies  Allergen Reactions   Other Rash   Tape Rash   Medications: Outpatient Medications Prior to Visit  Medication Sig   amLODipine (NORVASC) 10 MG tablet TAKE 1 TABLET (10 MG TOTAL) BY MOUTH DAILY. PLEASE SCHEDULE OFFICE VISIT BEFORE ANY FUTURE REFILLS   Ascorbic Acid (VITAMIN C PO) Take by mouth daily.   atorvastatin (LIPITOR) 80 MG tablet TAKE 1 TABLET BY MOUTH EVERY DAY   glipiZIDE (GLUCOTROL XL) 10 MG 24 hr tablet TAKE 1 TABLET (10 MG TOTAL) BY MOUTH DAILY WITH BREAKFAST.   indomethacin (INDOCIN) 25 MG capsule TAKE 1 CAPSULE  BY MOUTH THREE TIMES A DAY AS NEEDED   JARDIANCE 25 MG TABS tablet Take 1 tablet (25 mg total) by mouth daily.   lidocaine (XYLOCAINE) 2 % solution Please specify directions, refills and quantity   lisinopril (ZESTRIL) 40 MG tablet TAKE 1 TABLET BY MOUTH EVERY DAY   meclizine (ANTIVERT) 25 MG tablet Take 1 tablet (25 mg total) by mouth every 6 (six) hours as needed.   meloxicam (MOBIC) 15 MG tablet TAKE 1 TABLET BY MOUTH EVERY DAY   metFORMIN (GLUCOPHAGE) 500 MG tablet TAKE 2 TABLETS BY MOUTH EVERY DAY WITH BREAKFAST   Multiple Vitamin (MULTIVITAMIN) tablet Take 1 tablet by mouth daily.   oxyCODONE-acetaminophen (PERCOCET) 5-325 MG tablet Take 1 tablet by mouth every 4 (four) hours as needed for severe pain.    sitaGLIPtin (JANUVIA) 50 MG tablet Take 1 tablet (50 mg total) by mouth daily.   tadalafil (CIALIS) 20 MG tablet Take 0.5-1 tablets (10-20 mg total) by mouth every other day as needed for erectile dysfunction.   No facility-administered medications prior to visit.    Review of Systems  Constitutional: Negative.   HENT: Negative.    Respiratory: Negative.    Cardiovascular: Negative.   Gastrointestinal: Negative.       Objective    BP 138/90 (BP Location: Right Arm, Patient Position: Sitting, Cuff Size: Large)   Pulse 88   Resp 16   Ht 6\' 3"  (1.905 m)   Wt 243 lb (110.2 kg)   SpO2 97%   BMI 30.37 kg/m     Physical Exam Constitutional:      General: He is not in acute distress.    Appearance: He is well-developed.  HENT:     Head: Normocephalic and atraumatic.     Right Ear: Hearing and tympanic membrane normal.     Left Ear: Hearing and tympanic membrane normal.     Nose: Nose normal.     Mouth/Throat:     Pharynx: Oropharynx is clear.  Eyes:     General: Lids are normal. No scleral icterus.       Right eye: No discharge.        Left eye: No discharge.     Conjunctiva/sclera: Conjunctivae normal.  Cardiovascular:     Rate and Rhythm: Normal rate and regular rhythm.     Pulses: Normal pulses.     Heart sounds: Normal heart sounds.  Pulmonary:     Effort: Pulmonary effort is normal. No respiratory distress.     Breath sounds: Normal breath sounds.  Abdominal:     General: Bowel sounds are normal.     Palpations: Abdomen is soft.  Musculoskeletal:        General: Normal range of motion.  Skin:    Findings: No lesion or rash.  Neurological:     Mental Status: He is alert and oriented to person, place, and time.  Psychiatric:        Speech: Speech normal.        Behavior: Behavior normal.        Thought Content: Thought content normal.    Diabetic Foot Form - Detailed   Diabetic Foot Exam - detailed Diabetic Foot exam was performed with the following  findings: Yes 08/11/2020  4:47 PM  Visual Foot Exam completed.: Yes  Can the patient see the bottom of their feet?: Yes Are the shoes appropriate in style and fit?: Yes Is there swelling or and abnormal foot shape?: No Is there a claw toe  deformity?: No Is there elevated skin temparature?: No Is there foot or ankle muscle weakness?: No Normal Range of Motion: Yes Pulse Foot Exam completed.: Yes   Right posterior Tibialias: Present Left posterior Tibialias: Present   Right Dorsalis Pedis: Present Left Dorsalis Pedis: Present  Sensory Foot Exam Completed.: Yes Semmes-Weinstein Monofilament Test R Site 1-Great Toe: Pos L Site 1-Great Toe: Pos           Results for orders placed or performed in visit on 08/11/20  POCT glycosylated hemoglobin (Hb A1C)  Result Value Ref Range   Hemoglobin A1C 8.0 (A) 4.0 - 5.6 %   Est. average glucose Bld gHb Est-mCnc 183     Assessment & Plan     1. Type 2 diabetes mellitus with other specified complication, without long-term current use of insulin (HCC) Hgb A1C was 10.3% in October 2021 - today 8.0%. Still taking Metformin and Glipizide. Did not bring in FBS numbers or glucometer. Will recheck labs. Encouraged to get annual ophthalmology exam and follow up with Dr. Brita Romp pending lab reports. - POCT glycosylated hemoglobin (Hb A1C) - metFORMIN (GLUCOPHAGE) 500 MG tablet; TAKE 2 TABLETS BY MOUTH EVERY DAY WITH BREAKFAST  Dispense: 180 tablet; Refill: 1 - glipiZIDE (GLUCOTROL XL) 10 MG 24 hr tablet; Take 1 tablet (10 mg total) by mouth daily with breakfast.  Dispense: 90 tablet; Refill: 1 - CBC with Differential/Platelet - Comprehensive metabolic panel - Lipid panel  2. Hyperlipidemia due to type 2 diabetes mellitus ([place]) Tolerating Lipitor without side effects. Continue low fat diet and lose some weight. Get follow up labs and refill medication. - atorvastatin (LIPITOR) 80 MG tablet; Take 1 tablet (80 mg total) by mouth daily.  Dispense: 90  tablet; Refill: 1 - CBC with Differential/Platelet - Comprehensive metabolic panel - Lipid panel - TSH  3. Essential hypertension Fair BP control. Needs refill of Lisinopril and Amlodipine. Recheck follow up labs. - lisinopril (ZESTRIL) 40 MG tablet; Take 1 tablet (40 mg total) by mouth daily.  Dispense: 90 tablet; Refill: 1 - amLODipine (NORVASC) 10 MG tablet; Take 1 tablet (10 mg total) by mouth daily.  Dispense: 90 tablet; Refill: 1 - CBC with Differential/Platelet - Comprehensive metabolic panel - Lipid panel - TSH   No follow-ups on file.      I, Timothy Pho, PA-C, have reviewed all documentation for this visit. The documentation on 08/24/20 for the exam, diagnosis, procedures, and orders are all accurate and complete.    Timothy Murders, PA-C  [employer] [phone] (phone) [phone] (fax)  [place]

## 2020-08-11 NOTE — Telephone Encounter (Signed)
Seeing you today. Please discuss the 2 NSAIDs and refill as indicated

## 2020-08-24 NOTE — Telephone Encounter (Signed)
Sorry I did not see this message before I saw him on 08-11-20. Called him back and he has not used the Meloxicam or the Indomethacin in months. He did not mention need during that OV and did not need either refilled today. Finally admitted he has had some gout in the past and would call back if he needed them.

## 2020-09-18 ENCOUNTER — Other Ambulatory Visit: Payer: Self-pay | Admitting: Family Medicine

## 2020-11-05 ENCOUNTER — Other Ambulatory Visit: Payer: Self-pay | Admitting: Family Medicine

## 2020-11-05 DIAGNOSIS — E1169 Type 2 diabetes mellitus with other specified complication: Secondary | ICD-10-CM

## 2020-11-05 DIAGNOSIS — E785 Hyperlipidemia, unspecified: Secondary | ICD-10-CM

## 2020-11-05 NOTE — Telephone Encounter (Signed)
Requested Prescriptions  Pending Prescriptions Disp Refills  . JANUVIA 50 MG tablet [Pharmacy Med Name: JANUVIA 50 MG TABLET] 90 tablet     Sig: TAKE 1 TABLET BY [place]     Endocrinology:  Diabetes - DPP-4 Inhibitors Failed - 11/05/2020 11:48 AM      Failed - HBA1C is between 0 and 7.9 and within 180 days    Hemoglobin A1C  Date Value Ref Range Status  08/11/2020 8.0 (A) 4.0 - 5.6 % Final  11/28/2018 7.7  Final   Hgb A1c MFr Bld  Date Value Ref Range Status  11/23/2019 10.3 (H) 4.8 - 5.6 % Final    Comment:             Prediabetes: 5.7 - 6.4          Diabetes: >6.4          Glycemic control for adults with diabetes: <7.0          Failed - Cr in normal range and within 360 days    Creatinine, Ser  Date Value Ref Range Status  03/09/2020 1.91 (H) 0.61 - 1.24 mg/dL Final         Passed - Valid encounter within last 6 months    Recent Outpatient Visits          2 months ago Type 2 diabetes mellitus with other specified complication, without long-term current use of insulin ([place])   [employer], Vickki Muff, PA-C   7 months ago Cellulitis of mouth   [employer], Dionne Bucy, MD   8 months ago Encounter for annual physical exam   [employer], Dionne Bucy, MD   10 months ago Bursitis of left shoulder   [facility] [place], Dionne Bucy, MD   11 months ago Type 2 diabetes mellitus with other specified complication, without long-term current use of insulin ([place])   [place], Dionne Bucy, MD             . atorvastatin (LIPITOR) 40 MG tablet [Pharmacy Med Name: ATORVASTATIN 40 MG TABLET] 90 tablet     Sig: TAKE 1 TABLET BY MOUTH EVERY DAY     Cardiovascular:  Antilipid - Statins Passed - 11/05/2020 11:48 AM      Passed - Total Cholesterol in normal range and within 360 days    Cholesterol, Total  Date Value Ref Range Status  03/02/2020 133 100 - 199 mg/dL Final          Passed - LDL in normal range and within 360 days    LDL Chol Calc (NIH)  Date Value Ref Range Status  03/02/2020 74 0 - 99 mg/dL Final         Passed - HDL in normal range and within 360 days    HDL  Date Value Ref Range Status  03/02/2020 46 >39 mg/dL Final         Passed - Triglycerides in normal range and within 360 days    Triglycerides  Date Value Ref Range Status  03/02/2020 59 0 - 149 mg/dL Final         Passed - Patient is not pregnant      Passed - Valid encounter within last 12 months    Recent Outpatient Visits          2 months ago Type 2 diabetes mellitus with other specified complication, without long-term current use of insulin ([place])   [facility], Vickki Muff, PA-C   7 months ago Cellulitis of mouth   [facility] [place], Dionne Bucy, MD   8 months ago Encounter for annual physical exam   [facility], Dionne Bucy, MD   10 months ago Bursitis of left shoulder   [facility] [place], Dionne Bucy, MD   11 months ago Type 2 diabetes mellitus with other specified complication, without long-term current use of insulin [facility])   [facility], Dionne Bucy, MD

## 2020-11-05 NOTE — Telephone Encounter (Signed)
Requested medication (s) are due for refill today: yes  Requested medication (s) are on the active medication list: yes  Last refill:  05/12/20 #90 1 RF  Future visit scheduled: no  Notes to clinic:  overdue Hgb A1C Cr in normal range and within 360 days   Requested Prescriptions  Pending Prescriptions Disp Refills   JANUVIA 50 MG tablet [Pharmacy Med Name: JANUVIA 50 MG TABLET] 90 tablet     Sig: TAKE 1 TABLET BY [place]     Endocrinology:  Diabetes - DPP-4 Inhibitors Failed - 11/05/2020 11:48 AM      Failed - HBA1C is between 0 and 7.9 and within 180 days    Hemoglobin A1C  Date Value Ref Range Status  08/11/2020 8.0 (A) 4.0 - 5.6 % Final  11/28/2018 7.7  Final   Hgb A1c MFr Bld  Date Value Ref Range Status  11/23/2019 10.3 (H) 4.8 - 5.6 % Final    Comment:             Prediabetes: 5.7 - 6.4          Diabetes: >6.4          Glycemic control for adults with diabetes: <7.0           Failed - Cr in normal range and within 360 days    Creatinine, Ser  Date Value Ref Range Status  03/09/2020 1.91 (H) 0.61 - 1.24 mg/dL Final          Passed - Valid encounter within last 6 months    Recent Outpatient Visits           2 months ago Type 2 diabetes mellitus with other specified complication, without long-term current use of insulin ([place])   [employer], Vickki Muff, PA-C   7 months ago Cellulitis of mouth   [facility] [place], Dionne Bucy, MD   8 months ago Encounter for annual physical exam   [employer], Dionne Bucy, MD   10 months ago Bursitis of left shoulder   [facility] [place], Dionne Bucy, MD   11 months ago Type 2 diabetes mellitus with other specified complication, without long-term current use of insulin ([place])   [place], Dionne Bucy, MD              Refused Prescriptions Disp Refills   atorvastatin (LIPITOR) 40 MG tablet [Pharmacy Med Name:  ATORVASTATIN 40 MG TABLET] 90 tablet     Sig: TAKE 1 TABLET BY MOUTH EVERY DAY     Cardiovascular:  Antilipid - Statins Passed - 11/05/2020 11:48 AM      Passed - Total Cholesterol in normal range and within 360 days    Cholesterol, Total  Date Value Ref Range Status  03/02/2020 133 100 - 199 mg/dL Final          Passed - LDL in normal range and within 360 days    LDL Chol Calc (NIH)  Date Value Ref Range Status  03/02/2020 74 0 - 99 mg/dL Final          Passed - HDL in normal range and within 360 days    HDL  Date Value Ref Range Status  03/02/2020 46 >39 mg/dL Final          Passed - Triglycerides in normal range and within 360 days    Triglycerides  Date Value Ref Range Status  03/02/2020 59 0 - 149 mg/dL Final  Passed - Patient is not pregnant      Passed - Valid encounter within last 12 months    Recent Outpatient Visits           2 months ago Type 2 diabetes mellitus with other specified complication, without long-term current use of insulin ([place])   [place], Vickki Muff, PA-C   7 months ago Cellulitis of mouth   [facility] [place], Dionne Bucy, MD   8 months ago Encounter for annual physical exam   [facility], Dionne Bucy, MD   10 months ago Bursitis of left shoulder   [facility] [place], Dionne Bucy, MD   11 months ago Type 2 diabetes mellitus with other specified complication, without long-term current use of insulin [facility])   [facility], Dionne Bucy, MD

## 2020-11-06 DIAGNOSIS — L738 Other specified follicular disorders: Secondary | ICD-10-CM | POA: Diagnosis not present

## 2020-12-15 ENCOUNTER — Other Ambulatory Visit: Payer: Self-pay | Admitting: Family Medicine

## 2020-12-15 NOTE — Telephone Encounter (Signed)
Requested medications are due for refill today NO  Requested medications are on the active medication list NO  Last refill 10/27/20  Last visit 08/11/20  Future visit scheduled no  Notes to clinic Med is not on current med list, similar med ordered, please assess. Requested Prescriptions  Pending Prescriptions Disp Refills   sildenafil (VIAGRA) 100 MG tablet [Pharmacy Med Name: SILDENAFIL 100 MG TABLET] 30 tablet 11    Sig: TAKE ONE-HALF TO ONE TABLET BY MOUTH DAILY AS NEEDED FOR ERECTILE DYSFUNCTION     Urology: Erectile Dysfunction Agents Failed - 12/15/2020  2:31 PM      Failed - Last BP in normal range    BP Readings from Last 1 Encounters:  08/11/20 138/90          Passed - Valid encounter within last 12 months    Recent Outpatient Visits           4 months ago Type 2 diabetes mellitus with other specified complication, without long-term current use of insulin [facility])   [facility] [place], Vickki Muff, PA-C   9 months ago Cellulitis of mouth   [facility] [place], Dionne Bucy, MD   9 months ago Encounter for annual physical exam   [facility] [place], Dionne Bucy, MD   11 months ago Bursitis of left shoulder   [facility] [place], Dionne Bucy, MD   1 year ago Type 2 diabetes mellitus with other specified complication, without long-term current use of insulin [facility])   [facility], Dionne Bucy, MD

## 2020-12-20 ENCOUNTER — Other Ambulatory Visit: Payer: Self-pay | Admitting: Family Medicine

## 2020-12-20 DIAGNOSIS — R42 Dizziness and giddiness: Secondary | ICD-10-CM

## 2020-12-21 ENCOUNTER — Other Ambulatory Visit: Payer: Self-pay | Admitting: Family Medicine

## 2020-12-22 NOTE — Telephone Encounter (Signed)
Requested medication (s) are due for refill today: yes  Requested medication (s) are on the active medication list: yes  Last refill:  09/18/20  Future visit scheduled: no  Notes to clinic:  elevated creatine earlier in year- did not see any note that addressed Cr.   Requested Prescriptions  Pending Prescriptions Disp Refills   indomethacin (INDOCIN) 25 MG capsule [Pharmacy Med Name: INDOMETHACIN 25 MG CAPSULE] 90 capsule 2    Sig: TAKE 1 CAPSULE BY MOUTH THREE TIMES A DAY AS NEEDED     Analgesics:  NSAIDS Failed - 12/21/2020 10:50 AM      Failed - Cr in normal range and within 360 days    Creatinine, Ser  Date Value Ref Range Status  03/09/2020 1.91 (H) 0.61 - 1.24 mg/dL Final          Failed - HGB in normal range and within 360 days    Hemoglobin  Date Value Ref Range Status  03/09/2020 18.2 (H) 13.0 - 17.0 g/dL Final  03/02/2020 17.5 13.0 - 17.7 g/dL Final          Passed - Patient is not pregnant      Passed - Valid encounter within last 12 months    Recent Outpatient Visits           4 months ago Type 2 diabetes mellitus with other specified complication, without long-term current use of insulin ([place])   [employer], Vickki Muff, PA-C   9 months ago Cellulitis of mouth   [facility] [place], Dionne Bucy, MD   9 months ago Encounter for annual physical exam   [facility] [place], Dionne Bucy, MD   12 months ago Bursitis of left shoulder   [facility] [place], Dionne Bucy, MD   1 year ago Type 2 diabetes mellitus with other specified complication, without long-term current use of insulin [facility])   [facility], Dionne Bucy, MD

## 2021-01-05 ENCOUNTER — Other Ambulatory Visit: Payer: Self-pay | Admitting: Family Medicine

## 2021-01-05 DIAGNOSIS — R42 Dizziness and giddiness: Secondary | ICD-10-CM

## 2021-01-30 ENCOUNTER — Telehealth: Payer: Self-pay | Admitting: Family Medicine

## 2021-01-30 DIAGNOSIS — E1169 Type 2 diabetes mellitus with other specified complication: Secondary | ICD-10-CM

## 2021-01-30 DIAGNOSIS — I10 Essential (primary) hypertension: Secondary | ICD-10-CM

## 2021-01-30 MED ORDER — METFORMIN HCL 500 MG PO TABS: ORAL_TABLET | ORAL | 0 refills | Status: DC

## 2021-01-30 MED ORDER — GLIPIZIDE ER 10 MG PO TB24: 10.0000 mg | ORAL_TABLET | Freq: Every day | ORAL | 0 refills | Status: DC

## 2021-01-30 MED ORDER — LISINOPRIL 40 MG PO TABS: 40.0000 mg | ORAL_TABLET | Freq: Every day | ORAL | 0 refills | Status: DC

## 2021-01-30 NOTE — Telephone Encounter (Signed)
[facility] faxed refill request for the following medications:  metFORMIN (GLUCOPHAGE) 500 MG tablet   glipiZIDE (GLUCOTROL XL) 10 MG 24 hr tablet   lisinopril (ZESTRIL) 40 MG tablet   Please advise.

## 2021-01-31 ENCOUNTER — Other Ambulatory Visit: Payer: Self-pay | Admitting: Family Medicine

## 2021-03-10 ENCOUNTER — Other Ambulatory Visit: Payer: Self-pay | Admitting: Family Medicine

## 2021-03-12 NOTE — Telephone Encounter (Signed)
Please check with patient. I think he is only taking one of the 2. Should not be on both together. Can we figure out what he actually takes? Ok to fill the one he is taking.

## 2021-03-12 NOTE — Telephone Encounter (Signed)
Requested medication (s) are due for refill today:   Yes for both ° °Requested medication (s) are on the active medication list:   Yes for both ° °Future visit scheduled:   No ° ° °Last ordered: Viagra 02/01/2021 #30, 0 refills;   Cialis 03/02/2020 #30, 11 refills ° °Returned because protocol criteria not met.   Labs needed.  ° °Requested Prescriptions  °Pending Prescriptions Disp Refills  ° sildenafil (VIAGRA) 100 MG tablet [Pharmacy Med Name: SILDENAFIL 100 MG TABLET] 30 tablet 0  °  Sig: TAKE 1/2 TO 1 TABLET BY MOUTH DAILY AS NEEDED FOR ERECTILE DYSFUNCTION  °  ° Urology: Erectile Dysfunction Agents Failed - 03/10/2021  9:36 AM  °  °  Failed - AST in normal range and within 360 days  °  AST  °Date Value Ref Range Status  °03/09/2020 51 (H) 15 - 41 U/L Final  °  °  °  °  Failed - ALT in normal range and within 360 days  °  ALT  °Date Value Ref Range Status  °03/09/2020 68 (H) 0 - 44 U/L Final  °  °  °  °  Failed - Last BP in normal range  °  BP Readings from Last 1 Encounters:  °08/11/20 138/90  °  °  °  °  Passed - Valid encounter within last 12 months  °  Recent Outpatient Visits   ° °      ° 7 months ago Type 2 diabetes mellitus with other specified complication, without long-term current use of insulin (HCC)  ° [facility] Chrism[place], Dennis E, PA-C  ° 12 months ago Cellulitis of mouth  ° [facility] Bacigalupo, Angela M, MD  ° 1 year ago Encounter for annual physical exam  ° [facility] Bacigalupo, Angela M, MD  ° 1 year ago Bursitis of left shoulder  ° [facility] Bacigalupo, Angela M, MD  ° 1 year ago Type 2 diabetes mellitus with other specified complication, without long-term current use of insulin (HCC)  ° [facility] Bacigalupo, Angela M, MD  ° °  °  ° °  °  °  ° tadalafil (CIALIS) 20 MG tablet [Pharmacy Med Name: TADALAFIL 20 MG TABLET] 30 tablet 11  °  Sig: TAKE 1/2 TO 1 TABLET BY MOUTH EVERY OTHER DAY AS NEEDED FOR ERECTILE  DYSFUNCTION  °  ° Urology: Erectile Dysfunction Agents Failed - 03/10/2021  9:36 AM  °  °  Failed - AST in normal range and within 360 days  °  AST  °Date Value Ref Range Status  °03/09/2020 51 (H) 15 - 41 U/L Final  °  °  °  °  Failed - ALT in normal range and within 360 days  °  ALT  °Date Value Ref Range Status  °03/09/2020 68 (H) 0 - 44 U/L Final  °  °  °  °  Failed - Last BP in normal range  °  BP Readings from Last 1 Encounters:  °08/11/20 138/90  °  °  °  °  Passed - Valid encounter within last 12 months  °  Recent Outpatient Visits   ° °      ° 7 months ago Type 2 diabetes mellitus with other specified complication, without long-term current use of insulin (HCC)  ° [facility] Chrismon, Dennis E, PA-C  ° 12 months ago Cellulitis of mouth  ° [facility] Bacigalupo, Angela   M, MD   1 year ago Encounter for annual physical exam   [facility] [place], Dionne Bucy, MD   1 year ago Bursitis of left shoulder   [facility] [place], Dionne Bucy, MD   1 year ago Type 2 diabetes mellitus with other specified complication, without long-term current use of insulin [facility])   [facility], Dionne Bucy, MD

## 2021-03-14 NOTE — Telephone Encounter (Signed)
[email or web address]. PEC please advise as below, when patient calls back. CRM created.

## 2021-03-15 ENCOUNTER — Telehealth: Payer: Self-pay | Admitting: Family Medicine

## 2021-03-15 DIAGNOSIS — E785 Hyperlipidemia, unspecified: Secondary | ICD-10-CM

## 2021-03-15 DIAGNOSIS — E1169 Type 2 diabetes mellitus with other specified complication: Secondary | ICD-10-CM

## 2021-03-15 MED ORDER — ATORVASTATIN CALCIUM 80 MG PO TABS: 80.0000 mg | ORAL_TABLET | Freq: Every day | ORAL | 0 refills | Status: DC

## 2021-03-15 NOTE — Telephone Encounter (Signed)
[facility] faxed refill request for the following medications:  atorvastatin (LIPITOR) 80 MG tablet   Please advise.

## 2021-03-19 ENCOUNTER — Encounter: Payer: Self-pay | Admitting: *Deleted

## 2021-03-19 NOTE — Telephone Encounter (Signed)
This encounter was created in error - please disregard.

## 2021-03-19 NOTE — Telephone Encounter (Signed)
Requesting refill for sildenafil citrate 100 mg for ED. Patient is out of medication. Telephone VV scheduled for 04/05/21 to review medication effectiveness. Patient reports he has not been taking cilais and viagra together . May alternate within a 24 to 36 hour time frame. Pt reports neither medication is effective and takes up to 24 hours to work. Recommended patient review with PCP and appt scheduled 04/05/21 . Please refill cilalis.

## 2021-03-27 ENCOUNTER — Other Ambulatory Visit: Payer: Self-pay | Admitting: Family Medicine

## 2021-03-27 NOTE — Telephone Encounter (Signed)
Requested medication (s) are due for refill today: yes ° °Requested medication (s) are on the active medication list: yes ° °Last refill:  12/26/20 #90 with 2 RF ° °Future visit scheduled: 04/05/21 ° °Notes to clinic:  Labs ordered at last visit 08/11/2020 and not drawn, last labs are abnormal and from 03/09/2020, please assess. ° ° °  ° °Requested Prescriptions  °Pending Prescriptions Disp Refills  ° indomethacin (INDOCIN) 25 MG capsule [Pharmacy Med Name: INDOMETHACIN 25 MG CAPSULE] 90 capsule 2  °  Sig: TAKE 1 CAPSULE BY MOUTH THREE TIMES A DAY AS NEEDED  °  ° Analgesics:  NSAIDS Failed - 03/27/2021  1:47 AM  °  °  Failed - Manual Review: Labs are only required if the patient has taken medication for more than 8 weeks.  °  °  Failed - Cr in normal range and within 360 days  °  Creatinine, Ser  °Date Value Ref Range Status  °03/09/2020 1.91 (H) 0.61 - 1.24 mg/dL Final  °  °  °  °  Failed - HGB in normal range and within 360 days  °  Hemoglobin  °Date Value Ref Range Status  °03/09/2020 18.2 (H) 13.0 - 17.0 g/dL Final  °03/02/2020 17.5 13.0 - 17.7 g/dL Final  °  °  °  °  Failed - PLT in normal range and within 360 days  °  Platelets  °Date Value Ref Range Status  °03/09/2020 305 150 - 400 K/uL Final  °03/02/2020 286 150 - 450 x10E3/uL Final  °  °  °  °  Failed - HCT in normal range and within 360 days  °  HCT  °Date Value Ref Range Status  °03/09/2020 51.4 39.0 - 52.0 % Final  ° °Hematocrit  °Date Value Ref Range Status  °03/02/2020 52.1 (H) 37.5 - 51.0 % Final  °  °  °  °  Failed - eGFR is 30 or above and within 360 days  °  GFR calc Af Amer  °Date Value Ref Range Status  °03/02/2020 38 (L) >59 mL/min/1.73 Final  °  Comment:  °  **In accordance with recommendations from the NKF-ASN Task force,** °  [facility] is in the process of updating its eGFR calculation to the °  2021 CKD-EPI creatinine equation that estimates kidney function °  without a race variable. °  ° °GFR, Estimated  °Date Value Ref Range Status  °03/09/2020  42 (L) >60 mL/min Final  °  Comment:  °  (NOTE) °Calculated using the CKD-EPI Creatinine Equation (2021) °  °  °  °  °  Passed - Patient is not pregnant  °  °  Passed - Valid encounter within last 12 months  °  Recent Outpatient Visits   ° °      ° 7 months ago Type 2 diabetes mellitus with other specified complication, without long-term current use of insulin (HCC)  ° [facility] Chrismon, Dennis E, PA-C  ° 1 year ago Cellulitis of mouth  ° [facility] Bacigalupo, Angela M, MD  ° 1 year ago Encounter for annual physical exam  ° [facility] Bacigalupo, Angela M, MD  ° 1 year ago Bursitis of left shoulder  ° [facility] Bacigalupo, Angela M, MD  ° 1 year ago Type 2 diabetes mellitus with other specified complication, without long-term current use of insulin (HCC)  ° [facility] Bacigalupo, Angela M, MD  ° °  °  °  Future Appointments             In 1 week Bacigalupo, Dionne Bucy, MD [facility], [place]   In 1 month Bacigalupo, Dionne Bucy, MD [facility], [place]

## 2021-03-27 NOTE — Telephone Encounter (Signed)
See previous request from [facility]    Requested Prescriptions  Pending Prescriptions Disp Refills   indomethacin (INDOCIN) 25 MG capsule [Pharmacy Med Name: INDOMETHACIN 25 MG CAPSULE] 90 capsule 2    Sig: TAKE 1 CAPSULE BY MOUTH THREE TIMES A DAY AS NEEDED     Analgesics:  NSAIDS Failed - 03/27/2021  1:47 AM      Failed - Manual Review: Labs are only required if the patient has taken medication for more than 8 weeks.      Failed - Cr in normal range and within 360 days    Creatinine, Ser  Date Value Ref Range Status  03/09/2020 1.91 (H) 0.61 - 1.24 mg/dL Final          Failed - HGB in normal range and within 360 days    Hemoglobin  Date Value Ref Range Status  03/09/2020 18.2 (H) 13.0 - 17.0 g/dL Final  03/02/2020 17.5 13.0 - 17.7 g/dL Final          Failed - PLT in normal range and within 360 days    Platelets  Date Value Ref Range Status  03/09/2020 305 150 - 400 K/uL Final  03/02/2020 286 150 - 450 x10E3/uL Final          Failed - HCT in normal range and within 360 days    HCT  Date Value Ref Range Status  03/09/2020 51.4 39.0 - 52.0 % Final   Hematocrit  Date Value Ref Range Status  03/02/2020 52.1 (H) 37.5 - 51.0 % Final          Failed - eGFR is 30 or above and within 360 days    GFR calc Af Amer  Date Value Ref Range Status  03/02/2020 38 (L) >59 mL/min/1.73 Final    Comment:    **In accordance with recommendations from the NKF-ASN Task force,**   [facility] is in the process of updating its eGFR calculation to the   2021 CKD-EPI creatinine equation that estimates kidney function   without a race variable.    GFR, Estimated  Date Value Ref Range Status  03/09/2020 42 (L) >60 mL/min Final    Comment:    (NOTE) Calculated using the CKD-EPI Creatinine Equation (2021)           Passed - Patient is not pregnant      Passed - Valid encounter within last 12 months    Recent Outpatient Visits           7 months ago Type 2 diabetes mellitus  with other specified complication, without long-term current use of insulin ([place])   [place], Vickki Muff, PA-C   1 year ago Cellulitis of mouth   [facility] [place], Dionne Bucy, MD   1 year ago Encounter for annual physical exam   [facility] [place], Dionne Bucy, MD   1 year ago Bursitis of left shoulder   [facility] [place], Dionne Bucy, MD   1 year ago Type 2 diabetes mellitus with other specified complication, without long-term current use of insulin [facility])   [place], Dionne Bucy, MD       Future Appointments             In 1 week Bacigalupo, Dionne Bucy, MD [facility], [place]   In 1 month Bacigalupo, Dionne Bucy, MD [facility], [place]

## 2021-04-05 ENCOUNTER — Telehealth (INDEPENDENT_AMBULATORY_CARE_PROVIDER_SITE_OTHER): Payer: No Typology Code available for payment source | Admitting: Family Medicine

## 2021-04-05 ENCOUNTER — Other Ambulatory Visit: Payer: Self-pay

## 2021-04-05 ENCOUNTER — Encounter: Payer: Self-pay | Admitting: Family Medicine

## 2021-04-05 DIAGNOSIS — E785 Hyperlipidemia, unspecified: Secondary | ICD-10-CM | POA: Diagnosis not present

## 2021-04-05 DIAGNOSIS — N529 Male erectile dysfunction, unspecified: Secondary | ICD-10-CM | POA: Diagnosis not present

## 2021-04-05 DIAGNOSIS — E1169 Type 2 diabetes mellitus with other specified complication: Secondary | ICD-10-CM

## 2021-04-05 DIAGNOSIS — M6788 Other specified disorders of synovium and tendon, other site: Secondary | ICD-10-CM

## 2021-04-05 MED ORDER — LISINOPRIL 40 MG PO TABS: 40.0000 mg | ORAL_TABLET | Freq: Every day | ORAL | 1 refills | Status: DC

## 2021-04-05 MED ORDER — SITAGLIPTIN PHOSPHATE 50 MG PO TABS: 50.0000 mg | ORAL_TABLET | Freq: Every day | ORAL | 1 refills | Status: DC

## 2021-04-05 MED ORDER — ATORVASTATIN CALCIUM 80 MG PO TABS: 80.0000 mg | ORAL_TABLET | Freq: Every day | ORAL | 1 refills | Status: DC

## 2021-04-05 MED ORDER — TADALAFIL 20 MG PO TABS: 10.0000 mg | ORAL_TABLET | ORAL | 11 refills | Status: DC | PRN

## 2021-04-05 MED ORDER — METFORMIN HCL 500 MG PO TABS: ORAL_TABLET | ORAL | 1 refills | Status: DC

## 2021-04-05 MED ORDER — GLIPIZIDE ER 10 MG PO TB24
10.0000 mg | ORAL_TABLET | Freq: Every day | ORAL | 1 refills | Status: DC
Start: 2021-04-05 — End: 2021-07-02

## 2021-04-05 NOTE — Assessment & Plan Note (Signed)
Concern for left Achilles tendinosis given symptoms that patient reports ?Continue inserts with arch support ?Consider adding heel lift tissue ?Avoid extensive stretching of the Achilles at work when he is lifting cans ?If not improving, consider referral to sports medicine ?

## 2021-04-05 NOTE — Assessment & Plan Note (Signed)
Upcoming appt to recheck A1c ?Will send ROI for recent eye exam ?Needs uACR at next visit ?

## 2021-04-05 NOTE — Assessment & Plan Note (Signed)
Upcoming appointment to recheck labs ?Continue statin-refill sent today ?

## 2021-04-05 NOTE — Assessment & Plan Note (Signed)
Chronic and well controlled on Cialis ?Will d/c Viagra as this was not helpful ?Advised not to take these medications together ?Not on any nitrates ?

## 2021-04-05 NOTE — Progress Notes (Signed)
? ? ?MyChart Video Visit ? ? ? ?Virtual Visit via Video Note  ? ?This visit type was conducted due to national recommendations for restrictions regarding the COVID-19 Pandemic (e.g. social distancing) in an effort to limit this patient's exposure and mitigate transmission in our community. This patient is at least at moderate risk for complications without adequate follow up. This format is felt to be most appropriate for this patient at this time. Physical exam was limited by quality of the video and audio technology used for the visit.  ? ? ?Patient location: home ?Provider location: [facility] ?Persons involved in the visit: patient, provider  ? ?I discussed the limitations of evaluation and management by telemedicine and the availability of in person appointments. The patient expressed understanding and agreed to proceed. ? ?Patient: Timothy Douglas   DOB: [birth year]   51 y.o. Male  MRN: [id number] ?Visit Date: 04/05/2021 ? ?Today's healthcare provider: Lavon Paganini, MD  ? ?Chief Complaint  ?Patient presents with  ? Erectile Dysfunction  ? ?Subjective  ?  ?HPI  ? ?Tried viagra first and it helped a little, but didn't give full erection.  Cialis worked well though. He was not taking them at the same time or on the same day. ? ?L heel pain on the back over the achilles tendon.  On his feet all day at work. Trying insoles. Able to bear weight. ? ? ?Medications: ?Outpatient Medications Prior to Visit  ?Medication Sig  ? amLODipine (NORVASC) 10 MG tablet Take 1 tablet (10 mg total) by mouth daily.  ? Ascorbic Acid (VITAMIN C PO) Take by mouth daily.  ? indomethacin (INDOCIN) 25 MG capsule TAKE 1 CAPSULE BY MOUTH THREE TIMES A DAY AS NEEDED  ? lidocaine (XYLOCAINE) 2 % solution Please specify directions, refills and quantity  ? meclizine (ANTIVERT) 25 MG tablet TAKE 1 TABLET BY MOUTH EVERY 6 HOURS AS NEEDED.  ? Multiple Vitamin (MULTIVITAMIN) tablet Take 1 tablet by mouth daily.  ? [DISCONTINUED]  atorvastatin (LIPITOR) 80 MG tablet Take 1 tablet (80 mg total) by mouth daily. Please schedule office visit before any future refill.  ? [DISCONTINUED] glipiZIDE (GLUCOTROL XL) 10 MG 24 hr tablet Take 1 tablet (10 mg total) by mouth daily with breakfast.  ? [DISCONTINUED] JANUVIA 50 MG tablet TAKE 1 TABLET BY MOUTH EVERY DAY  ? [DISCONTINUED] lisinopril (ZESTRIL) 40 MG tablet Take 1 tablet (40 mg total) by mouth daily.  ? [DISCONTINUED] metFORMIN (GLUCOPHAGE) 500 MG tablet TAKE 2 TABLETS BY MOUTH EVERY DAY WITH BREAKFAST  ? [DISCONTINUED] sildenafil (VIAGRA) 100 MG tablet TAKE 1/2 TO 1 TABLET BY MOUTH DAILY AS NEEDED FOR ERECTILE DYSFUNCTION  ? [DISCONTINUED] tadalafil (CIALIS) 20 MG tablet Take 0.5-1 tablets (10-20 mg total) by mouth every other day as needed for erectile dysfunction.  ? [DISCONTINUED] JARDIANCE 25 MG TABS tablet Take 1 tablet (25 mg total) by mouth daily. (Patient not taking: Reported on 04/05/2021)  ? ?No facility-administered medications prior to visit.  ? ? ?Review of Systems ? ? ? Objective  ?  ?There were no vitals taken for this visit. ? ? ?Physical Exam ?Constitutional:   ?   General: He is not in acute distress. ?   Appearance: Normal appearance. He is not diaphoretic.  ?HENT:  ?   Head: Normocephalic.  ?Eyes:  ?   Conjunctiva/sclera: Conjunctivae normal.  ?Pulmonary:  ?   Effort: Pulmonary effort is normal. No respiratory distress.  ?Neurological:  ?   Mental Status: He is alert  and oriented to person, place, and time. Mental status is at baseline.  ?  ? ? ? Assessment & Plan  ?  ? ?Problem List Items Addressed This Visit   ? ?  ? Endocrine  ? T2DM (type 2 diabetes mellitus) ([place])  ?  Upcoming appt to recheck A1c ?Will send ROI for recent eye exam ?Needs uACR at next visit ?  ?  ? Relevant Medications  ? glipiZIDE (GLUCOTROL XL) 10 MG 24 hr tablet  ? metFORMIN (GLUCOPHAGE) 500 MG tablet  ? lisinopril (ZESTRIL) 40 MG tablet  ? sitaGLIPtin (JANUVIA) 50 MG tablet  ? atorvastatin (LIPITOR) 80  MG tablet  ? Hyperlipidemia due to type 2 diabetes mellitus ([place])  ?  Upcoming appointment to recheck labs ?Continue statin-refill sent today ?  ?  ? Relevant Medications  ? tadalafil (CIALIS) 20 MG tablet  ? glipiZIDE (GLUCOTROL XL) 10 MG 24 hr tablet  ? metFORMIN (GLUCOPHAGE) 500 MG tablet  ? lisinopril (ZESTRIL) 40 MG tablet  ? sitaGLIPtin (JANUVIA) 50 MG tablet  ? atorvastatin (LIPITOR) 80 MG tablet  ?  ? Musculoskeletal and Integument  ? Achilles tendinosis  ?  Concern for left Achilles tendinosis given symptoms that patient reports ?Continue inserts with arch support ?Consider adding heel lift tissue ?Avoid extensive stretching of the Achilles at work when he is lifting cans ?If not improving, consider referral to sports medicine ?  ?  ?  ? Other  ? Erectile dysfunction - Primary  ?  Chronic and well controlled on Cialis ?Will d/c Viagra as this was not helpful ?Advised not to take these medications together ?Not on any nitrates ?  ?  ?  ? ?No follow-ups on file.  ?  ? ?I discussed the assessment and treatment plan with the patient. The patient was provided an opportunity to ask questions and all were answered. The patient agreed with the plan and demonstrated an understanding of the instructions. ?  ?The patient was advised to call back or seek an in-person evaluation if the symptoms worsen or if the condition fails to improve as anticipated. ? ? ?I, Lavon Paganini, MD, have reviewed all documentation for this visit. The documentation on 04/05/21 for the exam, diagnosis, procedures, and orders are all accurate and complete. ? ? ?Virginia Crews, MD, MPH ?[place] ?[facility]   ?

## 2021-04-13 ENCOUNTER — Other Ambulatory Visit: Payer: Self-pay | Admitting: Family Medicine

## 2021-04-13 ENCOUNTER — Telehealth: Payer: Self-pay | Admitting: Family Medicine

## 2021-04-13 DIAGNOSIS — M6788 Other specified disorders of synovium and tendon, other site: Secondary | ICD-10-CM

## 2021-04-13 NOTE — Telephone Encounter (Signed)
Medication: glipiZIDE (GLUCOTROL XL) 10 MG 24 hr tablet [[id number]]  Pharmacy states that they have not received this script sent 04/05/21 Pt took last pill on Monday ? ?Has the patient contacted their pharmacy? YES  ?(Agent: If no, request that the patient contact the pharmacy for the refill. If patient does not wish to contact the pharmacy document the reason why and proceed with request.) ?(Agent: If yes, when and what did the pharmacy advise?) ? ?Preferred Pharmacy (with phone number or street name): [id number] [place]?Phone: [phone]: [phone]?Hours: Not open 24 hours ? ? ?Has the patient been seen for an appointment in the last year OR does the patient have an upcoming appointment? YES 04/05/21 ?Agent: Please be advised that RX refills may take up to 3 business days. We ask that you follow-up with your pharmacy. ?

## 2021-04-13 NOTE — Telephone Encounter (Signed)
Pt is calling to request referral for left heel. ? ?As discussed at [place] 04/05/21 Concern for left Achilles tendinosis given symptoms that patient reports ?Continue inserts with arch support ?Consider adding heel lift tissue ?Avoid extensive stretching of the Achilles at work when he is lifting cans ?If not improving, consider referral to sports medicine ? ?Pt is requesting pain medication for the left Heel as well.  ? ?[facility] [address] [facility] of [employer] ? ?[phone] ?

## 2021-04-13 NOTE — Telephone Encounter (Signed)
Call to pharmacy- they do have Rx- 3/9- was too soon to fill on that date- but is going through now and will fill for patient.  ?Requested Prescriptions  ?Pending Prescriptions Disp Refills  ?? glipiZIDE (GLUCOTROL XL) 10 MG 24 hr tablet 90 tablet 1  ?  Sig: Take 1 tablet (10 mg total) by mouth daily with breakfast.  ?  ? Endocrinology:  Diabetes - Sulfonylureas Failed - 04/13/2021  3:46 PM  ?  ?  Failed - HBA1C is between 0 and 7.9 and within 180 days  ?  Hemoglobin A1C  ?Date Value Ref Range Status  ?08/11/2020 8.0 (A) 4.0 - 5.6 % Final  ?11/28/2018 7.7  Final  ? ?Hgb A1c MFr Bld  ?Date Value Ref Range Status  ?11/23/2019 10.3 (H) 4.8 - 5.6 % Final  ?  Comment:  ?           Prediabetes: 5.7 - 6.4 ?         Diabetes: >6.4 ?         Glycemic control for adults with diabetes: <7.0 ?  ?   ?  ?  Failed - Cr in normal range and within 360 days  ?  Creatinine, Ser  ?Date Value Ref Range Status  ?03/09/2020 1.91 (H) 0.61 - 1.24 mg/dL Final  ?   ?  ?  Passed - Valid encounter within last 6 months  ?  Recent Outpatient Visits   ?      ? 1 week ago Erectile dysfunction, unspecified erectile dysfunction type  ? [facility] [place], Dionne Bucy, MD  ? 8 months ago Type 2 diabetes mellitus with other specified complication, without long-term current use of insulin ([place])  ? Magu[place]o, PA-C  ? 1 year ago Cellulitis of mouth  ? [facility] Bacigal[place], Dionne Bucy, MD  ? 1 year ago Encounter for annual physical exam  ? [facility] [place], Dionne Bucy, MD  ? 1 year ago Bursitis of left shoulder  ? [facility] [place], Dionne Bucy, MD  ?  ?  ?Future Appointments   ?        ? In 4 weeks Bacigalupo, Dionne Bucy, MD [facility]  ?  ? ?  ?  ?  ? ?

## 2021-04-15 ENCOUNTER — Encounter: Payer: Self-pay | Admitting: Emergency Medicine

## 2021-04-15 ENCOUNTER — Ambulatory Visit
Admission: EM | Admit: 2021-04-15 | Discharge: 2021-04-15 | Disposition: A | Discharge status: Home / Self Care | Payer: No Typology Code available for payment source

## 2021-04-15 DIAGNOSIS — M7752 Other enthesopathy of left foot: Secondary | ICD-10-CM

## 2021-04-15 MED ORDER — PREDNISONE 20 MG PO TABS
20.0000 mg | ORAL_TABLET | Freq: Every day | ORAL | 0 refills | Status: AC
Start: 2021-04-15 — End: 2021-04-20

## 2021-04-15 MED ORDER — KETOROLAC TROMETHAMINE 30 MG/ML IJ SOLN
30.0000 mg | Freq: Once | INTRAMUSCULAR | Status: AC
Start: 2021-04-15 — End: 2021-04-15
  Administered 2021-04-15: 30 mg via INTRAMUSCULAR

## 2021-04-15 MED ORDER — INDOMETHACIN 25 MG PO CAPS
25.0000 mg | ORAL_CAPSULE | Freq: Two times a day (BID) | ORAL | 0 refills | Status: DC | PRN
Start: 2021-04-15 — End: 2021-06-20

## 2021-04-15 MED ORDER — DEXAMETHASONE SODIUM PHOSPHATE 10 MG/ML IJ SOLN
10.0000 mg | Freq: Once | INTRAMUSCULAR | Status: AC
  Administered 2021-04-15: 10 mg via INTRAMUSCULAR

## 2021-04-15 MED ORDER — INDOMETHACIN 50 MG PO CAPS: 50.0000 mg | ORAL_CAPSULE | Freq: Two times a day (BID) | ORAL | 0 refills | Status: DC | PRN

## 2021-04-15 NOTE — ED Provider Notes (Signed)
Timothy Douglas    CSN: [id number] Arrival date & time: 04/15/21  0920      History   Chief Complaint Chief Complaint  Patient presents with   Leg Swelling    HPI Timothy Douglas is a 51 y.o. male.   HPI Patient presents today with swelling involving the left heel.  He denies any known injury.  He reports this all started 3 weeks ago and he was diagnosed with plantar fasciitis and was advised to obtain orthotics.  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 He feels this may have exacerbated his foot pain. Denies any specific injury. Patient has type 2 diabetes. He hasn't experienced any problems with this feet in the past. He endorses good sensation. Although the pain has progressed to the point he is experiencing difficulty walking.   Past Medical History:  Diagnosis Date   DJD (degenerative joint disease)    Essential hypertension    Hyperlipidemia due to type 2 diabetes mellitus (HCC)    Shoulder pain    T2DM (type 2 diabetes mellitus) (HCC)    Vertigo    1 episode over 10 yrs ago    Patient Active Problem List   Diagnosis Date Noted   Achilles tendinosis 04/05/2021   Bursitis of left shoulder 11/18/2019   Special screening for malignant neoplasms, colon    Polyp of transverse colon    Family history of thyroid disease 02/05/2019   Family history of leukemia 02/05/2019   Decreased libido 02/05/2019   Erectile dysfunction 02/05/2019   T2DM (type 2 diabetes mellitus) (HCC)    Hypertension associated with diabetes (HCC)    Hyperlipidemia due to type 2 diabetes mellitus (HCC)    Spondylolisthesis of lumbar region 04/08/2014    Past Surgical History:  Procedure Laterality Date   APPENDECTOMY     COLONOSCOPY WITH PROPOFOL N/A 03/19/2019   Procedure: COLONOSCOPY WITH BIOPSY;  Surgeon: Midge Minium, MD;  Location: [facility];  Service: Endoscopy;  Laterality: N/A;  Diabetic - oral meds Priority 4   EYE  SURGERY     KELOID EXCISION  1990s   KNEE ARTHROSCOPY Left    LUMBAR DISC SURGERY     LUMBAR FUSION     L5-S1   POLYPECTOMY N/A 03/19/2019   Procedure: POLYPECTOMY;  Surgeon: Midge Minium, MD;  Location: [facility];  Service: Endoscopy;  Laterality: N/A;   SPINAL CORD STIMULATOR IMPLANT         Home Medications    Prior to Admission medications   Medication Sig Start Date End Date Taking? Authorizing Provider  amLODipine (NORVASC) 10 MG tablet Take 1 tablet (10 mg total) by mouth daily. 08/11/20   Chrismon, Jodell Cipro, PA-C  Ascorbic Acid (VITAMIN C PO) Take by mouth daily.    [provider]  atorvastatin (LIPITOR) 80 MG tablet Take 1 tablet (80 mg total) by mouth daily. Please schedule office visit before any future refill. 04/05/21   Erasmo Downer, MD  glipiZIDE (GLUCOTROL XL) 10 MG 24 hr tablet Take 1 tablet (10 mg total) by mouth daily with breakfast. 04/05/21   Bacigalupo, Marzella Schlein, MD  hydrochlorothiazide (HYDRODIURIL) 25 MG tablet Take 25 mg by mouth daily. 03/08/21   [provider]  indomethacin (INDOCIN) 25 MG capsule TAKE 1 CAPSULE BY MOUTH THREE TIMES A DAY AS NEEDED 03/29/21   Bacigalupo, Marzella Schlein, MD  lidocaine (XYLOCAINE) 2 % solution Please specify directions, refills and  quantity 03/13/20   Bacigalupo, Marzella Schlein, MD  lisinopril (ZESTRIL) 40 MG tablet Take 1 tablet (40 mg total) by mouth daily. 04/05/21   Erasmo Downer, MD  meclizine (ANTIVERT) 25 MG tablet TAKE 1 TABLET BY MOUTH EVERY 6 HOURS AS NEEDED. 01/05/21   Erasmo Downer, MD  metFORMIN (GLUCOPHAGE) 500 MG tablet TAKE 2 TABLETS BY MOUTH EVERY DAY WITH BREAKFAST 04/05/21   Erasmo Downer, MD  Multiple Vitamin (MULTIVITAMIN) tablet Take 1 tablet by mouth daily.    [provider]  sitaGLIPtin (JANUVIA) 50 MG tablet Take 1 tablet (50 mg total) by mouth daily. 04/05/21   Erasmo Downer, MD  tadalafil (CIALIS) 20 MG tablet Take 0.5-1 tablets (10-20 mg total) by mouth  every other day as needed for erectile dysfunction. 04/05/21   Erasmo Downer, MD    Family History Family History  Problem Relation Age of Onset   Hypertension Mother    Bell's palsy Mother    Hypertension Father    Thyroid disease Sister    Thyroid disease Brother    Diabetes Maternal Grandmother    Leukemia Maternal Grandfather    Diabetes Paternal Grandmother    Leukemia Maternal Uncle    Congestive Heart Failure Paternal Great-grandfather    Breast cancer Neg Hx    Prostate cancer Neg Hx    Colon cancer Neg Hx     Social History Social History   Tobacco Use   Smoking status: Never   Smokeless tobacco: Never  Vaping Use   Vaping Use: Never used  Substance Use Topics   Alcohol use: Yes    Alcohol/week: 2.0 - 3.0 standard drinks    Types: 1 - 2 Glasses of wine, 1 Cans of beer per week   Drug use: Never     Allergies   Other and Tape   Review of Systems Review of Systems Pertinent negatives listed in HPI   Physical Exam Triage Vital Signs ED Triage Vitals  Enc Vitals Group     BP 04/15/21 1012 121/86     Pulse Rate 04/15/21 1012 (!) 107     Resp 04/15/21 1012 18     Temp 04/15/21 1012 99.1 F (37.3 C)     Temp Source 04/15/21 1012 Oral     SpO2 04/15/21 1012 96 %     Weight --      Height --      Head Circumference --      Peak Flow --      Pain Score 04/15/21 1011 5     Pain Loc --      Pain Edu? --      Excl. in GC? --    No data found.  Updated Vital Signs BP 121/86 (BP Location: Left Arm)   Pulse (!) 107   Temp 99.1 F (37.3 C) (Oral)   Resp 18   SpO2 96%   Visual Acuity Right Eye Distance:   Left Eye Distance:   Bilateral Distance:    Right Eye Near:   Left Eye Near:    Bilateral Near:     Physical Exam Constitutional:      Appearance: Normal appearance.  Cardiovascular:     Rate and Rhythm: Normal rate and regular rhythm.  Pulmonary:     Effort: Pulmonary effort is normal.     Breath sounds: Normal breath sounds.   Musculoskeletal:       Feet:  Feet:     Right foot:  Skin integrity: Skin integrity normal.     Left foot:     Skin integrity: Skin integrity normal.  Neurological:     General: No focal deficit present.     Mental Status: He is alert and oriented to person, place, and time.  Psychiatric:        Mood and Affect: Mood normal.        Behavior: Behavior normal.        Thought Content: Thought content normal.        Judgment: Judgment normal.     UC Treatments / Results  Labs (all labs ordered are listed, but only abnormal results are displayed) Labs Reviewed - No data to display  EKG   Radiology No results found.  Procedures Procedures (including critical care time)  Medications Ordered in UC Medications - No data to display  Initial Impression / Assessment and Plan / UC Course  I have reviewed the triage vital signs and the nursing notes.  Pertinent labs & imaging results that were available during my care of the patient were reviewed by me and considered in my medical decision making (see chart for details).    Imaging tech not on site today , therefore no imaging obtained. We will treat for left calcaneal bursitis and based on the appearance of the heel of the left foot.  Will place patient on a very brief course of prednisone given his underlying history of diabetes and we will have him transition to indomethacin which she has on hand at home as he has taken in the past for gout.  Advised to follow-up with specialty at [facility] if symptoms do not readily improve.  Also encouraged him to wear compression socks to improve circulation and this will also provide additional support to his feet given his occupation.  Patient verbalized understanding and agreement with today's plan. Final Clinical Impressions(s) / UC Diagnoses   Final diagnoses:  Left calcaneal bursitis     Discharge Instructions      Start prednisone once daily tomorrow and take for total 5  days and transition to indomethacin 25 mg twice daily for 5 days for heel and foot pain and inflammation.  If swelling of your ankle does not readily improve following treatment follow-up at [facility] as she will require additional imaging to determine the source of pain and swelling.  Recommend compression with either Ace wraps or use of compression socks daily to improve circulation but also decrease inflammation which is causing pain and swelling in her foot. Keep in mind the steroid will temporarily increase your blood sugar therefore reduce intake of any foods high in carbohydrates, sweetened foods and beverages, increase intake of protein in green vegetables while completing steroid therapy.  Hydrate well with water.  Check blood sugars consistently if you have any readings consistently greater than 300.  Treatment and follow-up with PCP    ED Prescriptions     Medication Sig Dispense Auth. Provider   indomethacin (INDOCIN) 50 MG capsule  (Status: Discontinued) Take 1 capsule (50 mg total) by mouth 2 (two) times daily as needed (Gout Attack or Foot pain). 30 capsule Bing Neighbors, FNP   predniSONE (DELTASONE) 20 MG tablet Take 1 tablet (20 mg total) by mouth daily with breakfast for 5 days. Patient not taking:  Reported on 04/19/2021 5 tablet Bing Neighbors, FNP   indomethacin (INDOCIN) 25 MG capsule Take 1 capsule (25 mg total) by mouth 2 (two) times daily as needed (Gout Attack or  Foot pain). 20 capsule Bing Neighbors, FNP      PDMP not reviewed this encounter.   Bing Neighbors, FNP 04/19/21 1910

## 2021-04-15 NOTE — Discharge Instructions (Addendum)
Start prednisone once daily tomorrow and take for total 5 days and transition to indomethacin 25 mg twice daily for 5 days for heel and foot pain and inflammation.  If swelling of your ankle does not readily improve following treatment follow-up at [facility] as she will require additional imaging to determine the source of pain and swelling.  Recommend compression with either Ace wraps or use of compression socks daily to improve circulation but also decrease inflammation which is causing pain and swelling in her foot. ?Keep in mind the steroid will temporarily increase your blood sugar therefore reduce intake of any foods high in carbohydrates, sweetened foods and beverages, increase intake of protein in green vegetables while completing steroid therapy.  Hydrate well with water.  Check blood sugars consistently if you have any readings consistently greater than 300.  Treatment and follow-up with PCP ?

## 2021-04-15 NOTE — ED Triage Notes (Signed)
Pt has swelling in his heel x 3 weeks. He was treated for plantar fascitis but continues to have swelling.  ?

## 2021-04-16 NOTE — Telephone Encounter (Signed)
Oops missed that part of the message. Ok to refer to sports med - Dr Zigmund Daniel, [facility]

## 2021-04-16 NOTE — Telephone Encounter (Signed)
Can use OTC Voltaren gel topically prn.  Can use tylenol '1000mg'$  TID prn.  Should not use NSAIDs at his current kidney function

## 2021-04-16 NOTE — Telephone Encounter (Signed)
Patient advised. FYI Patient went to Urgent Care and was started on prednisone and they increased his indomethacin. ?

## 2021-04-19 ENCOUNTER — Encounter: Payer: Self-pay | Admitting: Family Medicine

## 2021-04-19 ENCOUNTER — Other Ambulatory Visit: Payer: Self-pay

## 2021-04-19 ENCOUNTER — Ambulatory Visit: Payer: No Typology Code available for payment source | Admitting: Family Medicine

## 2021-04-19 ENCOUNTER — Ambulatory Visit
Admission: RE | Admit: 2021-04-19 | Discharge: 2021-04-19 | Disposition: A | Discharge status: Home / Self Care | Payer: No Typology Code available for payment source | Source: Ambulatory Visit | Attending: Family Medicine | Admitting: Family Medicine

## 2021-04-19 ENCOUNTER — Ambulatory Visit
Admission: RE | Admit: 2021-04-19 | Discharge: 2021-04-19 | Disposition: A | Discharge status: Home / Self Care | Payer: No Typology Code available for payment source | Attending: Family Medicine | Admitting: Family Medicine

## 2021-04-19 VITALS — BP 118/72 | HR 76 | Ht 75.0 in | Wt 237.2 lb

## 2021-04-19 DIAGNOSIS — M79672 Pain in left foot: Secondary | ICD-10-CM | POA: Diagnosis not present

## 2021-04-19 DIAGNOSIS — M7662 Achilles tendinitis, left leg: Secondary | ICD-10-CM | POA: Diagnosis not present

## 2021-04-19 DIAGNOSIS — M722 Plantar fascial fibromatosis: Secondary | ICD-10-CM

## 2021-04-19 DIAGNOSIS — M7989 Other specified soft tissue disorders: Secondary | ICD-10-CM | POA: Diagnosis not present

## 2021-04-19 DIAGNOSIS — M6528 Calcific tendinitis, other site: Secondary | ICD-10-CM | POA: Insufficient documentation

## 2021-04-19 MED ORDER — DICLOFENAC SODIUM 75 MG PO TBEC: 75.0000 mg | DELAYED_RELEASE_TABLET | Freq: Two times a day (BID) | ORAL | 0 refills | Status: DC

## 2021-04-19 MED ORDER — PREDNISONE 50 MG PO TABS: 50.0000 mg | ORAL_TABLET | Freq: Every day | ORAL | 0 refills | Status: DC

## 2021-04-19 NOTE — Assessment & Plan Note (Signed)
See additional assessment(s) for plan details. 

## 2021-04-19 NOTE — Patient Instructions (Signed)
-   Obtain x-rays today ?- Start diclofenac tomorrow, dose every a.m. and every p.m. scheduled (take with food) ?- If symptoms persist without improvement by Monday, hold diclofenac and dose 5-day prednisone course ?- Continue diclofenac for total duration of 2 weeks, after 2 weeks dose on an as-needed basis ?- Start home exercises early-mid next week and focus on steady advance ?- Return for follow-up in 4 weeks ?

## 2021-04-19 NOTE — Assessment & Plan Note (Signed)
Acute on chronic symptoms localized to the left posterior and inferior heel, most recent exacerbation over the past few weeks.  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  He has noted pain during weightbearing, ascending/descending stairs, did go to urgent care where prednisone was given, finish out last day today, did note benefit while on this without resolution of symptoms.  Does have some intermittent radiation of symptoms proximally to the lateral left knee posteriorly. ? ?Examination today shows fullness at the Achilles insertion, tenderness, consistent with calcific tendinopathy of the Achilles, he has a negative Thompson test, secondarily tender at the anterior calcaneus/plantar fascia, diffuse mild tenderness about the peroneals and posterior tibialis, single leg heel raise recreates pain, calf is supple, negative Homans, negative straight leg raise, generalized tenderness at the biceps femoris insertion. ? ?We reviewed the nature of the posterior chain in light of his findings at the Achilles and plantar fascia, secondarily at the biceps femoris insertion.  He has Achilles tendinitis, Planter fasciitis, and deconditioning throughout the posterior chain with tightness noted. ? ?Plan for scheduled diclofenac, low threshold to advance her prednisone if suboptimal progress noted by next week, overall dose of diclofenac x2 weeks, home exercises, x-rays, and follow-up in 4 weeks for reevaluation. ? ?Chronic condition, symptomatic, Rx management ?

## 2021-04-19 NOTE — Progress Notes (Signed)
?  ? ?  Primary Care / Sports Medicine Office Visit ? ?Patient Information:  ?Patient ID: Timothy Douglas, male DOB: 1970/04/28 Age: 51 y.o. MRN: [id number]  ? ?Timothy Douglas is a pleasant 51 y.o. male presenting with the following: ? ?Chief Complaint  ?Patient presents with  ? Tendonitis  ?  achilles tendon left, for a couple weeks. Medication is helping. Swelling has went down. Tender to tough. Went to UC was given Prednisone and finished the dose, states that it was helping. Whole foot hurt, used some wraps and seems to be focused on heel only.  Pain shoots up to side of knee.  ? ? ?Vitals:  ? 04/19/21 1547  ?BP: 118/72  ?Pulse: 76  ?SpO2: 97%  ? ?Vitals:  ? 04/19/21 1547  ?Weight: 237 lb 3.2 oz (107.6 kg)  ?Height: '6\' 3"'$  (1.905 m)  ? ?Body mass index is 29.65 kg/m?. ? ?No results found.  ? ?Independent interpretation of notes and tests performed by another provider:  ? ?None ? ?Procedures performed:  ? ?None ? ?Pertinent History, Exam, Impression, and Recommendations:  ? ?Achilles tendinitis of left lower extremity ?Acute on chronic symptoms localized to the left posterior and inferior heel, most recent exacerbation over the past few weeks.  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  He has noted pain during weightbearing, ascending/descending stairs, did go to urgent care where prednisone was given, finish out last day today, did note benefit while on this without resolution of symptoms.  Does have some intermittent radiation of symptoms proximally to the lateral left knee posteriorly. ? ?Examination today shows fullness at the Achilles insertion, tenderness, consistent with calcific tendinopathy of the Achilles, he has a negative Thompson test, secondarily tender at the anterior calcaneus/plantar fascia, diffuse mild tenderness about the peroneals and posterior tibialis, single  leg heel raise recreates pain, calf is supple, negative Homans, negative straight leg raise, generalized tenderness at the biceps femoris insertion. ? ?We reviewed the nature of the posterior chain in light of his findings at the Achilles and plantar fascia, secondarily at the biceps femoris insertion.  He has Achilles tendinitis, Planter fasciitis, and deconditioning throughout the posterior chain with tightness noted. ? ?Plan for scheduled diclofenac, low threshold to advance her prednisone if suboptimal progress noted by next week, overall dose of diclofenac x2 weeks, home exercises, x-rays, and follow-up in 4 weeks for reevaluation. ? ?Chronic condition, symptomatic, Rx management ? ?Plantar fasciitis, left ?See additional assessment(s) for plan details.  ? ?Orders & Medications ?Meds ordered this encounter  ?Medications  ? diclofenac (VOLTAREN) 75 MG EC tablet  ?  Sig: Take 1 tablet (75 mg total) by mouth 2 (two) times daily.  ?  Dispense:  60 tablet  ?  Refill:  0  ? predniSONE (DELTASONE) 50 MG tablet  ?  Sig: Take 1 tablet (50 mg total) by mouth daily.  ?  Dispense:  5 tablet  ?  Refill:  0  ? ?Orders Placed This Encounter  ?Procedures  ? DG Foot Complete Left  ?  ? ?Return in about 4 weeks (around 05/17/2021).  ?  ? ?Montel Culver, MD ? ? Primary Care Sports Medicine ?[facility] ?[place]  ? ?

## 2021-05-07 ENCOUNTER — Telehealth: Payer: Self-pay | Admitting: Family Medicine

## 2021-05-07 ENCOUNTER — Other Ambulatory Visit: Payer: Self-pay

## 2021-05-07 DIAGNOSIS — I10 Essential (primary) hypertension: Secondary | ICD-10-CM

## 2021-05-07 MED ORDER — AMLODIPINE BESYLATE 10 MG PO TABS: 10.0000 mg | ORAL_TABLET | Freq: Every day | ORAL | 1 refills | Status: DC

## 2021-05-07 NOTE — Telephone Encounter (Signed)
[facility] for the following medications: ? ?amLODipine (NORVASC) 10 MG tablet ? ?Please advise ? ?

## 2021-05-09 NOTE — Progress Notes (Signed)
?  ? ?I,Sulibeya S Dimas,acting as a scribe for Lavon Paganini, MD.,have documented all relevant documentation on the behalf of Lavon Paganini, MD,as directed by  Lavon Paganini, MD while in the presence of Lavon Paganini, MD. ? ? ?Established patient visit ? ? ?Patient: Timothy Douglas   DOB: 08-19-1970   51 y.o. Male  MRN: [id number] ?Visit Date: 05/11/2021 ? ?Today's healthcare provider: Lavon Paganini, MD  ? ?Chief Complaint  ?Patient presents with  ? Diabetes  ? Hyperlipidemia  ? Hypertension  ? ?Subjective  ?  ?HPI  ?Diabetes Mellitus Type II, follow-up ? ?Lab Results  ?Component Value Date  ? HGBA1C 8.0 (A) 08/11/2020  ? HGBA1C 7.1 (A) 03/02/2020  ? HGBA1C 10.3 (H) 11/23/2019  ? ?Last seen for diabetes 9 months ago.  ?Management since then includes continuing the same treatment. ?He reports excellent compliance with treatment. ?He is not having side effects.  ? ?Home blood sugar records: fasting range: 100s ? ?Episodes of hypoglycemia? No  ?  ?Current insulin regiment: none ?Most Recent Eye Exam: UTD ? ?--------------------------------------------------------------------------------------------------- ?Hypertension, follow-up ? ?BP Readings from Last 3 Encounters:  ?05/11/21 130/84  ?04/19/21 118/72  ?04/15/21 121/86  ? Wt Readings from Last 3 Encounters:  ?05/11/21 240 lb (108.9 kg)  ?04/19/21 237 lb 3.2 oz (107.6 kg)  ?08/11/20 243 lb (110.2 kg)  ?  ? ?He was last seen for hypertension 9 months ago.  ?BP at that visit was 138/90. ?Management since that visit includes no changes. ?He reports excellent compliance with treatment. ?He is not having side effects.  ?He is exercising. ?He is adherent to low salt diet.   ?Outside blood pressures are not being checked. ? ?He does not smoke. ? ?Use of agents associated with hypertension: none.  ? ?--------------------------------------------------------------------------------------------------- ?Lipid/Cholesterol, follow-up ? ?Last Lipid Panel: ?Lab  Results  ?Component Value Date  ? CHOL 133 03/02/2020  ? [place] 74 03/02/2020  ? HDL 46 03/02/2020  ? TRIG 59 03/02/2020  ? ? ?He was last seen for this 9 months ago.  ?Management since that visit includes no changes. ? ?He reports excellent compliance with treatment. ?He is not having side effects.  ? ?Symptoms: ?No appetite changes No foot ulcerations  ?No chest pain No chest pressure/discomfort  ?No dyspnea No orthopnea  ?No fatigue No lower extremity edema  ?No palpitations No paroxysmal nocturnal dyspnea  ?No nausea No numbness or tingling of extremity  ?No polydipsia No polyuria  ?No speech difficulty No syncope  ? ?He is following a Low Sodium diet. ?Current exercise: cardiovascular workout on exercise equipment ? ?Last metabolic panel ?Lab Results  ?Component Value Date  ? GLUCOSE 145 (H) 03/09/2020  ? NA 135 03/09/2020  ? K 5.4 (H) 03/09/2020  ? BUN 36 (H) 03/09/2020  ? CREATININE 1.91 (H) 03/09/2020  ? GFRNONAA 42 (L) 03/09/2020  ? CALCIUM 10.0 03/09/2020  ? AST 51 (H) 03/09/2020  ? ALT 68 (H) 03/09/2020  ? ?The 10-year ASCVD risk score (Arnett DK, et al., 2019) is: 17.2% ? ?--------------------------------------------------------------------------------------------------- ? ? ?Medications: ?Outpatient Medications Prior to Visit  ?Medication Sig  ? amLODipine (NORVASC) 10 MG tablet Take 1 tablet (10 mg total) by mouth daily.  ? Ascorbic Acid (VITAMIN C PO) Take by mouth daily.  ? atorvastatin (LIPITOR) 80 MG tablet Take 1 tablet (80 mg total) by mouth daily. Please schedule office visit before any future refill.  ? diclofenac (VOLTAREN) 75 MG EC tablet Take 1 tablet (75 mg total) by mouth 2 (two) times  daily.  ? glipiZIDE (GLUCOTROL XL) 10 MG 24 hr tablet Take 1 tablet (10 mg total) by mouth daily with breakfast.  ? hydrochlorothiazide (HYDRODIURIL) 25 MG tablet Take 25 mg by mouth daily.  ? indomethacin (INDOCIN) 25 MG capsule Take 1 capsule (25 mg total) by mouth 2 (two) times daily as needed (Gout  Attack or Foot pain).  ? lidocaine (XYLOCAINE) 2 % solution Please specify directions, refills and quantity  ? lisinopril (ZESTRIL) 40 MG tablet Take 1 tablet (40 mg total) by mouth daily.  ? meclizine (ANTIVERT) 25 MG tablet TAKE 1 TABLET BY MOUTH EVERY 6 HOURS AS NEEDED.  ? metFORMIN (GLUCOPHAGE) 500 MG tablet TAKE 2 TABLETS BY MOUTH EVERY DAY WITH BREAKFAST  ? Multiple Vitamin (MULTIVITAMIN) tablet Take 1 tablet by mouth daily.  ? sitaGLIPtin (JANUVIA) 50 MG tablet Take 1 tablet (50 mg total) by mouth daily.  ? tadalafil (CIALIS) 20 MG tablet Take 0.5-1 tablets (10-20 mg total) by mouth every other day as needed for erectile dysfunction.  ? [DISCONTINUED] predniSONE (DELTASONE) 50 MG tablet Take 1 tablet (50 mg total) by mouth daily.  ? ?No facility-administered medications prior to visit.  ? ? ?Review of Systems  ?Constitutional:  Negative for appetite change and fatigue.  ?Respiratory:  Negative for chest tightness and shortness of breath.   ?Cardiovascular:  Negative for chest pain, palpitations and leg swelling.  ?Musculoskeletal:   ?     Foot pain  ? ?  ?  Objective  ?  ?BP 130/84 (BP Location: Left Arm, Patient Position: Sitting, Cuff Size: Large)   Pulse 90   Temp 98.3 ?F (36.8 ?C) (Oral)   Resp 16   Wt 240 lb (108.9 kg)   SpO2 99%   BMI 30.00 kg/m?  ?BP Readings from Last 3 Encounters:  ?05/11/21 130/84  ?04/19/21 118/72  ?04/15/21 121/86  ? ?Wt Readings from Last 3 Encounters:  ?05/11/21 240 lb (108.9 kg)  ?04/19/21 237 lb 3.2 oz (107.6 kg)  ?08/11/20 243 lb (110.2 kg)  ? ?  ?Physical Exam ?Vitals reviewed.  ?Constitutional:   ?   General: He is not in acute distress. ?   Appearance: Normal appearance. He is not diaphoretic.  ?HENT:  ?   Head: Normocephalic and atraumatic.  ?Eyes:  ?   General: No scleral icterus. ?   Conjunctiva/sclera: Conjunctivae normal.  ?Cardiovascular:  ?   Rate and Rhythm: Normal rate and regular rhythm.  ?   Pulses: Normal pulses.  ?   Heart sounds: Normal heart sounds. No  murmur heard. ?Pulmonary:  ?   Effort: Pulmonary effort is normal. No respiratory distress.  ?   Breath sounds: Normal breath sounds. No wheezing or rhonchi.  ?Abdominal:  ?   General: There is no distension.  ?   Palpations: Abdomen is soft.  ?   Tenderness: There is no abdominal tenderness.  ?Musculoskeletal:  ?   Cervical back: Neck supple.  ?   Right lower leg: No edema.  ?   Left lower leg: No edema.  ?Lymphadenopathy:  ?   Cervical: No cervical adenopathy.  ?Skin: ?   General: Skin is warm and dry.  ?   Capillary Refill: Capillary refill takes less than 2 seconds.  ?   Findings: No rash.  ?Neurological:  ?   Mental Status: He is alert and oriented to person, place, and time.  ?   Cranial Nerves: No cranial nerve deficit.  ?Psychiatric:     ?  Mood and Affect: Mood normal.     ?   Behavior: Behavior normal.  ?  ? ? ?No results found for any visits on 05/11/21. ? Assessment & Plan  ?  ? ?Problem List Items Addressed This Visit   ? ?  ? Cardiovascular and Mediastinum  ? Hypertension associated with diabetes ([place])  ?  Well controlled on recheck (initially elevated) ?Continue current medications ?Recheck metabolic panel ?F/u in 3 months  ?  ?  ? Relevant Orders  ? Comprehensive metabolic panel  ?  ? Endocrine  ? T2DM (type 2 diabetes mellitus) ([place]) - Primary  ?  Previously uncontrolled with hyperglycemia ?Continue current medications ?Recheck a1c ?Assoc with/complicated by HTN, HLD ?UTD on vaccines, foot exam ?roi for last eye exam ?uACR ?On ACEi/ARB ?On Statin ?Discussed diet and exercise ?F/u in 3 months  ?  ?  ? Relevant Orders  ? Hemoglobin A1c  ? Urine Microalbumin w/creat. ratio  ? Hyperlipidemia due to type 2 diabetes mellitus ([place])  ?  Previously well controlled ?Continue statin ?Repeat FLP and CMP ?Goal LDL < 70 ?  ?  ? Relevant Orders  ? Lipid panel  ? Comprehensive metabolic panel  ? ?Other Visit Diagnoses   ? ? Leukocytosis, unspecified type      ? Relevant Orders  ? CBC w/Diff/Platelet  ? Need for  shingles vaccine      ? Relevant Orders  ? Varicella-zoster vaccine IM  ? ?  ?  ? ?Return in about 3 months (around 08/10/2021) for chronic disease f/u.  ?   ? ?I, Lavon Paganini, MD, have reviewed all documenta

## 2021-05-11 ENCOUNTER — Encounter: Payer: Self-pay | Admitting: Family Medicine

## 2021-05-11 ENCOUNTER — Ambulatory Visit: Payer: No Typology Code available for payment source | Admitting: Family Medicine

## 2021-05-11 VITALS — BP 130/84 | HR 90 | Temp 98.3°F | Resp 16 | Wt 240.0 lb

## 2021-05-11 DIAGNOSIS — I152 Hypertension secondary to endocrine disorders: Secondary | ICD-10-CM

## 2021-05-11 DIAGNOSIS — D72829 Elevated white blood cell count, unspecified: Secondary | ICD-10-CM | POA: Diagnosis not present

## 2021-05-11 DIAGNOSIS — E1159 Type 2 diabetes mellitus with other circulatory complications: Secondary | ICD-10-CM

## 2021-05-11 DIAGNOSIS — E1169 Type 2 diabetes mellitus with other specified complication: Secondary | ICD-10-CM | POA: Diagnosis not present

## 2021-05-11 DIAGNOSIS — Z23 Encounter for immunization: Secondary | ICD-10-CM

## 2021-05-11 DIAGNOSIS — E785 Hyperlipidemia, unspecified: Secondary | ICD-10-CM | POA: Diagnosis not present

## 2021-05-11 DIAGNOSIS — I10 Essential (primary) hypertension: Secondary | ICD-10-CM

## 2021-05-11 NOTE — Assessment & Plan Note (Addendum)
Previously uncontrolled with hyperglycemia ?Continue current medications ?Recheck a1c ?Assoc with/complicated by HTN, HLD ?UTD on vaccines, foot exam ?roi for last eye exam ?uACR ?On ACEi/ARB ?On Statin ?Discussed diet and exercise ?F/u in 3 months  ?

## 2021-05-11 NOTE — Assessment & Plan Note (Signed)
Previously well controlled Continue statin Repeat FLP and CMP Goal LDL < 70 

## 2021-05-11 NOTE — Assessment & Plan Note (Addendum)
Well controlled on recheck (initially elevated) ?Continue current medications ?Recheck metabolic panel ?F/u in 3 months  ?

## 2021-05-13 LAB — COMPREHENSIVE METABOLIC PANEL
ALT: 20 IU/L (ref 0–44)
AST: 24 IU/L (ref 0–40)
Albumin/Globulin Ratio: 1.7 (ref 1.2–2.2)
Albumin: 4.5 g/dL (ref 3.8–4.9)
Alkaline Phosphatase: 87 IU/L (ref 44–121)
BUN/Creatinine Ratio: 17 (ref 9–20)
BUN: 24 mg/dL (ref 6–24)
Bilirubin Total: 0.6 mg/dL (ref 0.0–1.2)
CO2: 23 mmol/L (ref 20–29)
Calcium: 9.7 mg/dL (ref 8.7–10.2)
Chloride: 99 mmol/L (ref 96–106)
Creatinine, Ser: 1.43 mg/dL — ABNORMAL HIGH (ref 0.76–1.27)
Globulin, Total: 2.7 g/dL (ref 1.5–4.5)
Glucose: 239 mg/dL — ABNORMAL HIGH (ref 70–99)
Potassium: 5.1 mmol/L (ref 3.5–5.2)
Sodium: 137 mmol/L (ref 134–144)
Total Protein: 7.2 g/dL (ref 6.0–8.5)
eGFR: 59 mL/min/{1.73_m2} — ABNORMAL LOW (ref >59)

## 2021-05-13 LAB — CBC WITH DIFFERENTIAL/PLATELET
Basophils Absolute: 0.1 10*3/uL (ref 0.0–0.2)
Basos: 1 %
EOS (ABSOLUTE): 0.1 10*3/uL (ref 0.0–0.4)
Eos: 1 %
Hematocrit: 44.6 % (ref 37.5–51.0)
Hemoglobin: 15.4 g/dL (ref 13.0–17.7)
Immature Grans (Abs): 0 10*3/uL (ref 0.0–0.1)
Immature Granulocytes: 0 %
Lymphocytes Absolute: 4.5 10*3/uL — ABNORMAL HIGH (ref 0.7–3.1)
Lymphs: 43 %
MCH: 28.6 pg (ref 26.6–33.0)
MCHC: 34.5 g/dL (ref 31.5–35.7)
MCV: 83 fL (ref 79–97)
Monocytes Absolute: 0.7 10*3/uL (ref 0.1–0.9)
Monocytes: 7 %
Neutrophils Absolute: 5.1 10*3/uL (ref 1.4–7.0)
Neutrophils: 48 %
Platelets: 225 10*3/uL (ref 150–450)
RBC: 5.38 x10E6/uL (ref 4.14–5.80)
RDW: 15.8 % — ABNORMAL HIGH (ref 11.6–15.4)
WBC: 10.5 10*3/uL (ref 3.4–10.8)

## 2021-05-13 LAB — HEMOGLOBIN A1C
Est. average glucose Bld gHb Est-mCnc: 220 mg/dL
Hgb A1c MFr Bld: 9.3 % — ABNORMAL HIGH (ref 4.8–5.6)

## 2021-05-13 LAB — MICROALBUMIN / CREATININE URINE RATIO
Creatinine, Urine: 172.7 mg/dL
Microalb/Creat Ratio: <2 mg/g creat (ref 0–29)
Microalbumin, Urine: <3 ug/mL

## 2021-05-13 LAB — LIPID PANEL
Chol/HDL Ratio: 3.1 ratio (ref 0.0–5.0)
Cholesterol, Total: 124 mg/dL (ref 100–199)
HDL: 40 mg/dL (ref >39)
LDL Chol Calc (NIH): 68 mg/dL (ref 0–99)
Triglycerides: 82 mg/dL (ref 0–149)
VLDL Cholesterol Cal: 16 mg/dL (ref 5–40)

## 2021-05-17 ENCOUNTER — Ambulatory Visit: Payer: No Typology Code available for payment source | Admitting: Family Medicine

## 2021-05-17 ENCOUNTER — Encounter: Payer: Self-pay | Admitting: Family Medicine

## 2021-05-17 ENCOUNTER — Other Ambulatory Visit: Payer: Self-pay | Admitting: Family Medicine

## 2021-05-17 VITALS — BP 116/76 | HR 84 | Ht 75.0 in | Wt 239.0 lb

## 2021-05-17 DIAGNOSIS — M19072 Primary osteoarthritis, left ankle and foot: Secondary | ICD-10-CM | POA: Diagnosis not present

## 2021-05-17 DIAGNOSIS — M6528 Calcific tendinitis, other site: Secondary | ICD-10-CM | POA: Diagnosis not present

## 2021-05-17 DIAGNOSIS — M722 Plantar fascial fibromatosis: Secondary | ICD-10-CM

## 2021-05-17 DIAGNOSIS — M7662 Achilles tendinitis, left leg: Secondary | ICD-10-CM

## 2021-05-17 MED ORDER — DICLOFENAC SODIUM 75 MG PO TBEC: 75.0000 mg | DELAYED_RELEASE_TABLET | Freq: Two times a day (BID) | ORAL | 0 refills | Status: DC | PRN

## 2021-05-17 NOTE — Telephone Encounter (Signed)
Requested medication (s) are due for refill today: yes ? ?Requested medication (s) are on the active medication list: yes ? ?Last refill:  04/19/21 #60 0 refills ? ?Future visit scheduled: yes today  ? ?Notes to clinic:  do you want to continue refills Rx? ? ? ?  ?Requested Prescriptions  ?Pending Prescriptions Disp Refills  ? diclofenac (VOLTAREN) 75 MG EC tablet [Pharmacy Med Name: DICLOFENAC SOD EC 75 MG TAB] 60 tablet 0  ?  Sig: TAKE 1 TABLET BY MOUTH TWICE A DAY  ?  ? Analgesics:  NSAIDS Failed - 05/17/2021  2:31 AM  ?  ?  Failed - Manual Review: Labs are only required if the patient has taken medication for more than 8 weeks.  ?  ?  Failed - Cr in normal range and within 360 days  ?  Creatinine, Ser  ?Date Value Ref Range Status  ?05/11/2021 1.43 (H) 0.76 - 1.27 mg/dL Final  ?  ?  ?  ?  Passed - HGB in normal range and within 360 days  ?  Hemoglobin  ?Date Value Ref Range Status  ?05/11/2021 15.4 13.0 - 17.7 g/dL Final  ?  ?  ?  ?  Passed - PLT in normal range and within 360 days  ?  Platelets  ?Date Value Ref Range Status  ?05/11/2021 225 150 - 450 x10E3/uL Final  ?  ?  ?  ?  Passed - HCT in normal range and within 360 days  ?  Hematocrit  ?Date Value Ref Range Status  ?05/11/2021 44.6 37.5 - 51.0 % Final  ?  ?  ?  ?  Passed - eGFR is 30 or above and within 360 days  ?  GFR calc Af Amer  ?Date Value Ref Range Status  ?03/02/2020 38 (L) >59 mL/min/1.73 Final  ?  Comment:  ?  **In accordance with recommendations from the NKF-ASN Task force,** ?  [facility] is in the process of updating its eGFR calculation to the ?  2021 CKD-EPI creatinine equation that estimates kidney function ?  without a race variable. ?  ? ?GFR, Estimated  ?Date Value Ref Range Status  ?03/09/2020 42 (L) >60 mL/min Final  ?  Comment:  ?  (NOTE) ?Calculated using the CKD-EPI Creatinine Equation (2021) ?  ? ?eGFR  ?Date Value Ref Range Status  ?05/11/2021 59 (L) >59 mL/min/1.73 Final  ?  ?  ?  ?  Passed - Patient is not pregnant  ?  ?  Passed -  Valid encounter within last 12 months  ?  Recent Outpatient Visits   ? ?      ? 6 days ago Type 2 diabetes mellitus with other specified complication, without long-term current use of insulin ([place])  ? [facility] [place], Dionne Bucy, MD  ? 4 weeks ago Achilles tendinitis of left lower extremity  ? [place], MD  ? 1 month ago Erectile dysfunction, unspecified erectile dysfunction type  ? [facility] [place], Dionne Bucy, MD  ? 9 months ago Type 2 diabetes mellitus with other specified complication, without long-term current use of insulin ([place])  ? [place], PA-C  ? 1 year ago Cellulitis of mouth  ? [facility] [place], Dionne Bucy, MD  ? ?  ?  ?Future Appointments   ? ?        ? Today Montel Culver, MD [facility], [place]  ? In 2  months [place], Dionne Bucy, MD [facility]  ? ?  ? ? ?  ?  ?  ? ?

## 2021-05-17 NOTE — Progress Notes (Signed)
?  ? ?Primary Care / Sports Medicine Office Visit ? ?Patient Information:  ?Patient ID: Timothy Douglas, male DOB: Sep 25, 1970 Age: 51 y.o. MRN: [id number]  ? ?Timothy Douglas is a pleasant 51 y.o. male presenting with the following: ? ?Chief Complaint  ?Patient presents with  ? Achilles tendinitis of left lower extremity   ?  Pt states that it is feeling better but does have some pain at times but not near like it was.   ? ? ?Vitals:  ? 05/17/21 1409  ?BP: 116/76  ?Pulse: 84  ?SpO2: 97%  ? ?Vitals:  ? 05/17/21 1409  ?Weight: 239 lb (108.4 kg)  ?Height: '6\' 3"'$  (1.905 m)  ? ?Body mass index is 29.87 kg/m?. ? ?DG Foot Complete Left ? ?Result Date: 04/22/2021 ?CLINICAL DATA:  Left foot pain with swelling in heel for 3 weeks. Technologist note reports patient was treated for plantar fascitis, but continues to have swelling. EXAM: LEFT FOOT - COMPLETE 3+ VIEW COMPARISON:  None. FINDINGS: No fracture or malalignment. Mild degenerative changes at the first MTP joint. Tiny plantar calcaneal spur IMPRESSION: 1. Mild first MTP joint degenerative change. 2. Punctate calcaneal spur Electronically Signed   By: Donavan Foil M.D.   On: 04/22/2021 16:08    ? ?Independent interpretation of notes and tests performed by another provider:  ? ?Independent interpretation of left foot x-ray dated 04/22/2021 reveals cortical roughening and well-circumscribed radiopaque density in the region of the insertional Achilles consistent with Achilles enthesophyte, calcaneal spur noted, first MTP mild asymmetric narrowing and osteophytes consistent with osteoarthritis. ? ?Procedures performed:  ? ?None ? ?Pertinent History, Exam, Impression, and Recommendations:  ? ?Problem List Items Addressed This Visit   ? ?  ? Musculoskeletal and Integument  ? 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  Patient presents for follow-up to acute on chronic left Achilles tendinopathy, at last visit on 04/19/2021 he was advised x-rays, prednisone course,  scheduled diclofenac, and close follow-up. ? ?He states that symptoms have improved but have not resolved, noting sporadic, near daily episodes of sharp pain to the inferior gastrocnemius, mid-proximal third Achilles pain.  He also brings up more noticeable pain at his first MTP region. ? ?Examination reveals focal tenderness at the Achilles insertion, there is associated prominence at the site, maximal tenderness at the middle third and proximal third Achilles, inferior gastrocnemius, additionally at the first MTP diffusely.  X-rays were independently reviewed and shown to patient to correlate with physical examination findings and stated symptoms. ? ?Given the Achilles enthesophyte noted, calcific Achilles tendinitis of concern, consistent with the chronic nature of this condition.  Given his interval progress I have advised advance to formal physical therapy with a focus on eccentric exercises throughout the gastrocnemius, tendon conditioning, intrinsic foot musculature strengthening, and addressing gait mechanics.  From a medication management standpoint, I have advised a transition from scheduled diclofenac to as needed dosing.  He will return for follow-up in 5 weeks for reassessment.  For suboptimal progress, cam boot immobilization, local injections, and advanced imaging to all be considered. ? ?Chronic condition, symptomatic, Rx management ? ?  ?  ? Relevant Medications  ? diclofenac (VOLTAREN) 75 MG EC tablet  ? Other Relevant Orders  ? Ambulatory referral to Physical Therapy  ? Osteoarthritis of first metatarsophalangeal (MTP) joint of left foot - Primary  ?  Symptomatic condition in the setting of comorbid calcific Achilles tendinopathy, degree of involvement noted on recent x-rays. See additional assessment(s) for plan details. ? ?  ?  ?  Relevant Medications  ? diclofenac (VOLTAREN) 75 MG EC tablet  ? Other Relevant Orders  ? Ambulatory referral to Physical Therapy  ?  ? ?Orders & Medications ?Meds  ordered this encounter  ?Medications  ? diclofenac (VOLTAREN) 75 MG EC tablet  ?  Sig: Take 1 tablet (75 mg total) by mouth 2 (two) times daily as needed.  ?  Dispense:  60 tablet  ?  Refill:  0  ? ?Orders Placed This Encounter  ?Procedures  ? Ambulatory referral to Physical Therapy  ?  ? ?Return in about 5 weeks (around 06/21/2021).  ?  ? ?Montel Culver, MD ? ? Primary Care Sports Medicine ?[facility] ?[place]  ? ?

## 2021-05-17 NOTE — Assessment & Plan Note (Signed)
Symptomatic condition in the setting of comorbid calcific Achilles tendinopathy, degree of involvement noted on recent x-rays. See additional assessment(s) for plan details. ?

## 2021-05-17 NOTE — Assessment & Plan Note (Signed)
Patient presents for follow-up to acute on chronic left Achilles tendinopathy, at last visit on 04/19/2021 he was advised x-rays, prednisone course, scheduled diclofenac, and close follow-up. ? ?He states that symptoms have improved but have not resolved, noting sporadic, near daily episodes of sharp pain to the inferior gastrocnemius, mid-proximal third Achilles pain.  He also brings up more noticeable pain at his first MTP region. ? ?Examination reveals focal tenderness at the Achilles insertion, there is associated prominence at the site, maximal tenderness at the middle third and proximal third Achilles, inferior gastrocnemius, additionally at the first MTP diffusely.  X-rays were independently reviewed and shown to patient to correlate with physical examination findings and stated symptoms. ? ?Given the Achilles enthesophyte noted, calcific Achilles tendinitis of concern, consistent with the chronic nature of this condition.  Given his interval progress I have advised advance to formal physical therapy with a focus on eccentric exercises throughout the gastrocnemius, tendon conditioning, intrinsic foot musculature strengthening, and addressing gait mechanics.  From a medication management standpoint, I have advised a transition from scheduled diclofenac to as needed dosing.  He will return for follow-up in 5 weeks for reassessment.  For suboptimal progress, cam boot immobilization, local injections, and advanced imaging to all be considered. ? ?Chronic condition, symptomatic, Rx management ?

## 2021-05-30 ENCOUNTER — Ambulatory Visit: Payer: No Typology Code available for payment source | Attending: Family Medicine | Admitting: Physical Therapy

## 2021-05-30 ENCOUNTER — Encounter: Payer: Self-pay | Admitting: Physical Therapy

## 2021-05-30 DIAGNOSIS — M25572 Pain in left ankle and joints of left foot: Secondary | ICD-10-CM | POA: Diagnosis not present

## 2021-05-30 DIAGNOSIS — M6528 Calcific tendinitis, other site: Secondary | ICD-10-CM | POA: Diagnosis not present

## 2021-05-30 DIAGNOSIS — M19072 Primary osteoarthritis, left ankle and foot: Secondary | ICD-10-CM | POA: Insufficient documentation

## 2021-05-30 NOTE — Therapy (Signed)
[facility] ?[address] ?[place], [place], [address] ?Phone: [phone]   Fax:  [phone] ? ?Physical Therapy Evaluation ? ?Patient Details  ?Name: Timothy Douglas ?MRN: [id number] ?Date of Birth: 1970-09-17 ?No data recorded ? ?Encounter Date: 05/30/2021 ? ? PT End of Session - 05/30/21 1759   ? ? Visit Number 1   ? Number of Visits 13   ? Date for PT Re-Evaluation 07/11/21   ? Progress Note Due on Visit 10   ? PT Start Time 1519   ? PT Stop Time 1600   ? PT Time Calculation (min) 41 min   ? Activity Tolerance Patient tolerated treatment well   ? Behavior During Therapy [facility] for tasks assessed/performed   ? ?  ?  ? ?  ? ? ?Past Medical History:  ?Diagnosis Date  ? DJD (degenerative joint disease)   ? Essential hypertension   ? Hyperlipidemia due to type 2 diabetes mellitus ([place])   ? Shoulder pain   ? T2DM (type 2 diabetes mellitus) ([place])   ? Vertigo   ? 1 episode over 10 yrs ago  ? ? ?Past Surgical History:  ?Procedure Laterality Date  ? APPENDECTOMY    ? COLONOSCOPY WITH PROPOFOL N/A 03/19/2019  ? Procedure: COLONOSCOPY WITH BIOPSY;  Surgeon: Lucilla Lame, MD;  Location: [place];  Service: Endoscopy;  Laterality: N/A;  Diabetic - oral meds ?Priority 4  ? EYE SURGERY    ? KELOID EXCISION  1990s  ? KNEE ARTHROSCOPY Left   ? LUMBAR DISC SURGERY    ? LUMBAR FUSION    ? L5-S1  ? POLYPECTOMY N/A 03/19/2019  ? Procedure: POLYPECTOMY;  Surgeon: Lucilla Lame, MD;  Location: [facility];  Service: Endoscopy;  Laterality: N/A;  ? SPINAL CORD STIMULATOR IMPLANT    ? ? ?There were no vitals filed for this visit. ? ? ? Subjective Assessment - 05/30/21 1757   ? ? 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 Pertinent History Pt reports left achilles tendinitis that began a couple months ago. Pain associated with this is described as sharp, cramping and stretching (in arch). Locations of pain include Achilles tendon, insertion point of tendon at  heel, medial longitudinal arch and medial forefoot. At initial onset, pt reports swelling to an extent that he could not put on his shoe and it disrupted his sleep. He is now able to sleep better however does wake occassionally with cramping in his achilles/foot. Today he felt a cramp in his forefoot that reminded him of when the tendonitis first started - pt afraid the pain and swelling will return at full force again. Pain is not always felt; sometimes he describes sensation as his foot feel "tired" but not in pain. 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 He typically uses his left foot to tilt paper bin (pt states it looks like a large trash bin) that weighs anywhere between 200-400 pounds; he is now trying to consciously alternate left and right feet.   ? Limitations Walking;Standing;House hold activities   ? How long can you sit comfortably? unlimited   ? How long can you stand comfortably? 1 hour   ? How long can you walk comfortably? <1 hour   ? Patient Stated Goals Learn some techniques to help ease the tightness in the achilles; learn how  to prevent achilles pain   ? Currently in Pain? No/denies   ? ?  ?  ? ?  ? ? ? ? ?Evaluate and Treat.  Calcific Achilles tendinitis, first MTP osteoarthritis, focus on heavy eccentric exercises throughout the gastrocnemius, intrinsic foot muscle strengthening, gait mechanics ?1-2 times per week for 4-6 weeks. ?Decrease pain, increase strength, flexibility, function, and range of motion. ?Modalities may include, traction, ionto, phono, and stim. ?May include dry needling with or without stim. ? ? ? ? ?SUBJECTIVE ?Chief complaint: Pt reports left achilles tendinitis that began a couple months ago. Pain associated with this is described as sharp, cramping and stretching (in arch). Locations of pain include Achilles tendon, insertion point of tendon at heel,  medial longitudinal arch and medial forefoot. At initial onset, pt reports swelling to an extent that he could not put on his shoe and it disrupted his sleep. He is now able to sleep better however does wake occassionally with cramping in his achilles/foot. Today he felt a cramp in his forefoot that reminded him of when the tendonitis first started - pt afraid the pain and swelling will return at full force again. Pain is not always felt; sometimes he describes sensation as his foot feel "tired" but not in pain. 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 He typically uses his left foot to tilt paper bin (pt states it looks like a large trash bin) that weighs anywhere between 200-400 pounds; he is now trying to consciously alternate left and right feet.  ? ?History: onset a couple months ago; has gotten better since but he is still having cramping and pain throughout his work day. ?Referring Dx: Calcific Achilles tendinitis, first MTP osteoarthritis ?Referring Provider: Dr. Rosette Reveal  ?Pain location: LLE forefoot, arch, heel and achilles tendon  ?Pain: Present 0/10, Best 0/10, Worst 9/10 ?Pain quality: crampy, sharp, stabbing, stretching/burning (arch), sore (forefoot) ?Radiating pain: No  ?Numbness/Tingling: No ?24 hour pain behavior: best in the evening when he's been home from work for a couple hours; midday is the worst.  ?Aggravating factors: walking, weightbearing, tilting the paper bin with his left foot (using foot to prop it in order to pull back to tilt) ?Easing factors: rest/non-weightbearing positions, heat, water massage (using shower head), warm water with epsom salts, calf stretch, heel-toe rocks, ankle circles  ?History of back, hip, or knee pain: Yes - L5-S1 fusion, torn-disc in mid-back.  ?Follow-up appointment with MD: Yes - 06/16/21 ?Imaging: Yes - x-rays ?Occupational demands:  [employer]; 8-12 hour shifts standing, walking and getting in/out of the truck.; he must tilt the paper bin (pt states it looks like a large trash bin) that weighs anywhere between 200-400 pounds and pull/push it. ?Hobbies: reading, walking, exercise, driving/sightseeing, travel    ?Typical footwear: steel-toed shoes for work; Air [employer]'s when not at work, slides in the home  ?Red Flags: Negative  ?Goals: learn some techniques to help ease the tightness in the achilles; learn how to prevent achilles pain ? ? ?OBJECTIVE ? ?MUSCULOSKELETAL: ? ?Bulk: Normal ?**Bony enlargement on posterior/superior/lateral aspect of calcaneous near Achilles insertion on LLE only; RLE normal. ? ? ?Lumbar/Hip/Knee Screen ?AROM: WFL and painless with overpressure in all planes ? ?Posture ?No gross abnormalities noted. ?*Decreased arches and increased pronation bilaterally. ? ?Gait ?Moderate pronation noted during gait cycle. Toe out [facility]. No unilateral abnormalities.  ? ?Palpation ?  Tenderness to palpation along left Achilles tendon, medial arch and forefoot MTP joints. Tension, trigger points and tenderness to medial and lateral left gastroc.  ? ?Strength ?Full strength in bilateral hips, knees and ankles.  ? ?AROM ?R/L ?50/40 Ankle Plantarflexion ?20/15 Ankle Dorsiflexion ?35/32 Ankle Inversion ?20/25 Ankle Eversion ?-no pain with OP ? ? ? ?NEUROLOGICAL: ? ?Sensation ?Grossly intact to light touch bilateral LEs as determined by testing dermatomes L2-S2 ? ? ?INTERVENTION ?Pt was educated on use of rolling stick and lacrosse ball. PT demonstrated; pt demonstrated with good technique and verbalized understanding. x5 minutes  ? ? ? 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 PT examination reveals tenderness in soft tissues surrounding left ankle and foot. No strength deficits, limited ROM deficits. Gait appears to be equal bilaterally; he does present with moderate  pronation when observed without shoes. PT introduced rolling stick and lacrosse ball for self-massage to release tension in gastroc and foot. Pt reporting subjective improvement after just a couple minutes. Pt

## 2021-06-05 ENCOUNTER — Ambulatory Visit: Payer: No Typology Code available for payment source | Admitting: Physical Therapy

## 2021-06-19 ENCOUNTER — Ambulatory Visit: Payer: No Typology Code available for payment source | Admitting: Physical Therapy

## 2021-06-20 ENCOUNTER — Other Ambulatory Visit: Payer: Self-pay | Admitting: Family Medicine

## 2021-06-21 ENCOUNTER — Ambulatory Visit: Payer: No Typology Code available for payment source | Admitting: Family Medicine

## 2021-06-21 ENCOUNTER — Other Ambulatory Visit: Payer: Self-pay | Admitting: Family Medicine

## 2021-06-21 DIAGNOSIS — M19072 Primary osteoarthritis, left ankle and foot: Secondary | ICD-10-CM

## 2021-06-21 DIAGNOSIS — M6528 Calcific tendinitis, other site: Secondary | ICD-10-CM

## 2021-06-22 NOTE — Telephone Encounter (Signed)
Patient will call back to set up an appointment. He also mention that it was the third time someone tried to reach him.

## 2021-06-22 NOTE — Telephone Encounter (Signed)
Requested medication (s) are due for refill today: yes  Requested medication (s) are on the active medication list: yes  Last refill:  05/17/21 #60  Future visit scheduled: no- called pt to make appt and he said he will  call back to schedule. Pt canceled the appt he had   Notes to clinic:  Sending back to Dr Zigmund Daniel to review if can refill med.    Requested Prescriptions  Pending Prescriptions Disp Refills   diclofenac (VOLTAREN) 75 MG EC tablet [Pharmacy Med Name: DICLOFENAC SOD EC 75 MG TAB] 60 tablet 0    Sig: TAKE 1 TABLET BY MOUTH 2 TIMES DAILY AS NEEDED.     Analgesics:  NSAIDS Failed - 06/21/2021  2:00 AM      Failed - Manual Review: Labs are only required if the patient has taken medication for more than 8 weeks.      Failed - Cr in normal range and within 360 days    Creatinine, Ser  Date Value Ref Range Status  05/11/2021 1.43 (H) 0.76 - 1.27 mg/dL Final         Passed - HGB in normal range and within 360 days    Hemoglobin  Date Value Ref Range Status  05/11/2021 15.4 13.0 - 17.7 g/dL Final         Passed - PLT in normal range and within 360 days    Platelets  Date Value Ref Range Status  05/11/2021 225 150 - 450 x10E3/uL Final         Passed - HCT in normal range and within 360 days    Hematocrit  Date Value Ref Range Status  05/11/2021 44.6 37.5 - 51.0 % Final         Passed - eGFR is 30 or above and within 360 days    GFR calc Af Amer  Date Value Ref Range Status  03/02/2020 38 (L) >59 mL/min/1.73 Final    Comment:    **In accordance with recommendations from the NKF-ASN Task force,**   [facility] is in the process of updating its eGFR calculation to the   2021 CKD-EPI creatinine equation that estimates kidney function   without a race variable.    GFR, Estimated  Date Value Ref Range Status  03/09/2020 42 (L) >60 mL/min Final    Comment:    (NOTE) Calculated using the CKD-EPI Creatinine Equation (2021)    eGFR  Date Value Ref Range Status   05/11/2021 59 (L) >59 mL/min/1.73 Final         Passed - Patient is not pregnant      Passed - Valid encounter within last 12 months    Recent Outpatient Visits           1 month ago Osteoarthritis of first metatarsophalangeal (MTP) joint of left foot   [facility] Montel Culver, MD   1 month ago Type 2 diabetes mellitus with other specified complication, without long-term current use of insulin [facility])   [facility] [place], Dionne Bucy, MD   2 months ago Achilles tendinitis of left lower extremity   [place], Jason J, MD   2 months ago Erectile dysfunction, unspecified erectile dysfunction type   [facility], Dionne Bucy, MD   10 months ago Type 2 diabetes mellitus with other specified complication, without long-term current use of insulin [facility])   [place], Vickki Muff, [place]       Future Appointments  In 1 month Bacigalupo, Dionne Bucy, MD [facility] [place]

## 2021-07-01 ENCOUNTER — Other Ambulatory Visit: Payer: Self-pay | Admitting: Family Medicine

## 2021-07-19 ENCOUNTER — Other Ambulatory Visit: Payer: Self-pay | Admitting: Family Medicine

## 2021-07-27 NOTE — Progress Notes (Signed)
Timothy Douglas,acting as a Education administrator for Timothy Sprout, FNP.,have documented all relevant documentation on the behalf of Timothy Sprout, FNP,as directed by  Timothy Sprout, FNP while in the presence of Timothy Sprout, FNP.   Established patient visit   Patient: Timothy Douglas   DOB: 23-Oct-1970   51 y.o. Male  MRN: [id number] Visit Date: 08/10/2021  Today's healthcare provider: [name], FNP  Introduced to nurse practitioner role and practice setting.  All questions answered.  Discussed provider/patient relationship and expectations.   Chief Complaint  Patient presents with   Diabetes   Hypertension   Subjective    HPI  Diabetes Mellitus Type II, Follow-up  Lab Results  Component Value Date   HGBA1C 8.7 (A) 08/10/2021   HGBA1C 9.3 (H) 05/11/2021   HGBA1C 8.0 (A) 08/11/2020   Wt Readings from Last 3 Encounters:  08/10/21 244 lb (110.7 kg)  05/17/21 239 lb (108.4 kg)  05/11/21 240 lb (108.9 kg)   Last seen for diabetes 3 months ago.  Management since then includes continue medication. He reports excellent compliance with treatment. He is not having side effects.  Symptoms: No fatigue No foot ulcerations  No appetite changes No nausea  No paresthesia of the feet  No polydipsia  No polyuria No visual disturbances   No vomiting     Home blood sugar records: fasting range: 120s  Episodes of hypoglycemia? No    Current insulin regiment: none Most Recent Eye Exam: UTD  Current exercise: walking Current diet habits: in general, a "healthy" diet    Pertinent Labs: Lab Results  Component Value Date   CHOL 124 05/11/2021   HDL 40 05/11/2021   LDLCALC 68 05/11/2021   TRIG 82 05/11/2021   CHOLHDL 3.1 05/11/2021   Lab Results  Component Value Date   NA 137 05/11/2021   K 5.1 05/11/2021   CREATININE 1.43 (H) 05/11/2021   EGFR 59 (L) 05/11/2021   MICROALBUR <3.0 03/01/2017   LABMICR <3.0 05/11/2021      ---------------------------------------------------------------------------------------------------  Hypertension, follow-up  BP Readings from Last 3 Encounters:  08/10/21 115/79  05/17/21 116/76  05/11/21 130/84   Wt Readings from Last 3 Encounters:  08/10/21 244 lb (110.7 kg)  05/17/21 239 lb (108.4 kg)  05/11/21 240 lb (108.9 kg)     He was last seen for hypertension 3 months ago.  BP at that visit was 116/76. Management since that visit includes Continue current medications.  He reports excellent compliance with treatment. He is not having side effects.  He is following a Regular diet. He is exercising.  Outside blood pressures are not being checked. Symptoms: No chest pain No chest pressure  No palpitations No syncope  No dyspnea No orthopnea  No paroxysmal nocturnal dyspnea No lower extremity edema   Pertinent labs Lab Results  Component Value Date   CHOL 124 05/11/2021   HDL 40 05/11/2021   LDLCALC 68 05/11/2021   TRIG 82 05/11/2021   CHOLHDL 3.1 05/11/2021   Lab Results  Component Value Date   NA 137 05/11/2021   K 5.1 05/11/2021   CREATININE 1.43 (H) 05/11/2021   EGFR 59 (L) 05/11/2021   GLUCOSE 239 (H) 05/11/2021   TSH 1.020 03/02/2020     The ASCVD Risk score (Arnett DK, et al., 2019) failed to calculate for the following reasons:   The valid total cholesterol range is 130 to 320 mg/dL  ---------------------------------------------------------------------------------------------------  Medications: Outpatient Medications Prior to  Visit  Medication Sig   amLODipine (NORVASC) 10 MG tablet Take 1 tablet (10 mg total) by mouth daily.   Ascorbic Acid (VITAMIN C PO) Take by mouth daily.   glipiZIDE (GLUCOTROL) 10 MG tablet Take 1 tablet (10 mg total) by mouth 2 (two) times daily before a meal.   indomethacin (INDOCIN) 25 MG capsule TAKE 1 CAPSULE BY MOUTH THREE TIMES A DAY AS NEEDED   lidocaine (XYLOCAINE) 2 % solution Please specify directions,  refills and quantity   Multiple Vitamin (MULTIVITAMIN) tablet Take 1 tablet by mouth daily.   [DISCONTINUED] atorvastatin (LIPITOR) 80 MG tablet Take 1 tablet (80 mg total) by mouth daily. Please schedule office visit before any future refill.   [DISCONTINUED] diclofenac (VOLTAREN) 75 MG EC tablet Take 1 tablet (75 mg total) by mouth 2 (two) times daily as needed.   [DISCONTINUED] hydrochlorothiazide (HYDRODIURIL) 25 MG tablet Take 25 mg by mouth daily.   [DISCONTINUED] lisinopril (ZESTRIL) 40 MG tablet Take 1 tablet (40 mg total) by mouth daily.   [DISCONTINUED] meclizine (ANTIVERT) 25 MG tablet TAKE 1 TABLET BY MOUTH EVERY 6 HOURS AS NEEDED.   [DISCONTINUED] metFORMIN (GLUCOPHAGE) 500 MG tablet TAKE 2 TABLETS BY MOUTH EVERY DAY WITH BREAKFAST   [DISCONTINUED] sitaGLIPtin (JANUVIA) 50 MG tablet Take 1 tablet (50 mg total) by mouth daily.   [DISCONTINUED] tadalafil (CIALIS) 20 MG tablet Take 0.5-1 tablets (10-20 mg total) by mouth every other day as needed for erectile dysfunction.   No facility-administered medications prior to visit.    Review of Systems  Constitutional:  Negative for appetite change and fatigue.  Eyes:  Negative for visual disturbance.  Respiratory:  Negative for chest tightness and shortness of breath.   Cardiovascular:  Negative for chest pain, palpitations and leg swelling.  Gastrointestinal:  Negative for abdominal pain, nausea and vomiting.  Endocrine: Negative for polydipsia, polyphagia and polyuria.  Neurological:  Negative for numbness.        Objective    BP 115/79 (BP Location: Right Arm, Patient Position: Sitting, Cuff Size: Large)   Pulse 97   Temp 98.3 F (36.8 C) (Oral)   Resp 16   Wt 244 lb (110.7 kg)   SpO2 100%   BMI 30.50 kg/m  BP Readings from Last 3 Encounters:  08/10/21 115/79  05/17/21 116/76  05/11/21 130/84   Wt Readings from Last 3 Encounters:  08/10/21 244 lb (110.7 kg)  05/17/21 239 lb (108.4 kg)  05/11/21 240 lb (108.9 kg)     Problem List Items Addressed This Visit       Cardiovascular and Mediastinum   Hypertension associated with diabetes ([place])    Chronic, stable At goal, <130/<80 Continue lisinopril 40 mg Continue HCTZ 25 mg Continue Norvasc 10 mg Denies complications       Relevant Medications   atorvastatin (LIPITOR) 80 MG tablet   hydrochlorothiazide (HYDRODIURIL) 25 MG tablet   lisinopril (ZESTRIL) 40 MG tablet   sitaGLIPtin (JANUVIA) 50 MG tablet   metFORMIN (GLUCOPHAGE-XR) 750 MG 24 hr tablet   tadalafil (CIALIS) 20 MG tablet     Endocrine   Hyperlipidemia due to type 2 diabetes mellitus (HCC)    Chronic, previous elevated Doing well with increase from 40 to 80 mg of lipitor I recommend diet low in saturated fat and regular exercise - 30 min at least 5 times per week       Relevant Medications   atorvastatin (LIPITOR) 80 MG tablet   hydrochlorothiazide (HYDRODIURIL) 25 MG tablet  lisinopril (ZESTRIL) 40 MG tablet   sitaGLIPtin (JANUVIA) 50 MG tablet   metFORMIN (GLUCOPHAGE-XR) 750 MG 24 hr tablet   tadalafil (CIALIS) 20 MG tablet   T2DM (type 2 diabetes mellitus) ([place]) - Primary    Chronic, improved However, A1c remains >8% Patient agreeable to increase Metformin from 1000 mg regular release to 1500 mg XR to assist Continue to recommend balanced, lower carb meals. Smaller meal size, adding snacks. Choosing water as drink of choice and increasing purposeful exercise. 3 month f/u On AceI On Statin UTD on Eye/Urine micro      Relevant Medications   atorvastatin (LIPITOR) 80 MG tablet   lisinopril (ZESTRIL) 40 MG tablet   sitaGLIPtin (JANUVIA) 50 MG tablet   metFORMIN (GLUCOPHAGE-XR) 750 MG 24 hr tablet   Other Relevant Orders   POCT glycosylated hemoglobin (Hb A1C) (Completed)     Musculoskeletal and Integument   Arthritis    Chronic, variable Often in feet/associated with job Request for refill of NSAID, voltaren tabs      Relevant Medications   diclofenac  (VOLTAREN) 75 MG EC tablet     Genitourinary   Stage 3a chronic kidney disease (HCC)    Chronic, back at baseline Denies need for referral to nephro at this time Is working on hydration Could add farxiga to assist if needed       Relevant Medications   lisinopril (ZESTRIL) 40 MG tablet     Other   Erectile dysfunction    Chronic, variable Increase to 20 mg of Cialis daily; refer to Urology for further assistance       Relevant Medications   tadalafil (CIALIS) 20 MG tablet   Other Relevant Orders   Ambulatory referral to Urology   Need for shingles vaccine    Consented; VIS previously provided      Relevant Orders   Varicella-zoster vaccine IM   Vertigo    Chronic, associated with large weather shifts Request for meclizine refill Refer to ENT if needed      Relevant Medications   meclizine (ANTIVERT) 25 MG tablet     Return in about 3 months (around 11/10/2021) for chonic disease management.      Vonna Kotyk, FNP, have reviewed all documentation for this visit. The documentation on 08/10/21 for the exam, diagnosis, procedures, and orders are all accurate and complete.    Timothy Douglas, [place] [phone] (phone) [phone] (fax)  [place]

## 2021-07-28 ENCOUNTER — Other Ambulatory Visit: Payer: Self-pay | Admitting: Family Medicine

## 2021-08-10 ENCOUNTER — Ambulatory Visit: Payer: No Typology Code available for payment source | Admitting: Family Medicine

## 2021-08-10 ENCOUNTER — Encounter: Payer: Self-pay | Admitting: Family Medicine

## 2021-08-10 VITALS — BP 115/79 | HR 97 | Temp 98.3°F | Resp 16 | Wt 244.0 lb

## 2021-08-10 DIAGNOSIS — E785 Hyperlipidemia, unspecified: Secondary | ICD-10-CM | POA: Diagnosis not present

## 2021-08-10 DIAGNOSIS — N529 Male erectile dysfunction, unspecified: Secondary | ICD-10-CM | POA: Diagnosis not present

## 2021-08-10 DIAGNOSIS — M1990 Unspecified osteoarthritis, unspecified site: Secondary | ICD-10-CM | POA: Insufficient documentation

## 2021-08-10 DIAGNOSIS — E1169 Type 2 diabetes mellitus with other specified complication: Secondary | ICD-10-CM | POA: Diagnosis not present

## 2021-08-10 DIAGNOSIS — E1159 Type 2 diabetes mellitus with other circulatory complications: Secondary | ICD-10-CM | POA: Diagnosis not present

## 2021-08-10 DIAGNOSIS — I152 Hypertension secondary to endocrine disorders: Secondary | ICD-10-CM

## 2021-08-10 DIAGNOSIS — R42 Dizziness and giddiness: Secondary | ICD-10-CM | POA: Insufficient documentation

## 2021-08-10 DIAGNOSIS — N1831 Chronic kidney disease, stage 3a: Secondary | ICD-10-CM | POA: Diagnosis not present

## 2021-08-10 DIAGNOSIS — Z23 Encounter for immunization: Secondary | ICD-10-CM | POA: Diagnosis not present

## 2021-08-10 LAB — POCT GLYCOSYLATED HEMOGLOBIN (HGB A1C)
Est. average glucose Bld gHb Est-mCnc: 203
Hemoglobin A1C: 8.7 % — AB (ref 4.0–5.6)

## 2021-08-10 MED ORDER — DICLOFENAC SODIUM 75 MG PO TBEC: 75.0000 mg | DELAYED_RELEASE_TABLET | Freq: Two times a day (BID) | ORAL | 0 refills | Status: DC | PRN

## 2021-08-10 MED ORDER — LISINOPRIL 40 MG PO TABS: 40.0000 mg | ORAL_TABLET | Freq: Every day | ORAL | 1 refills | Status: DC

## 2021-08-10 MED ORDER — TADALAFIL 20 MG PO TABS: 20.0000 mg | ORAL_TABLET | Freq: Every day | ORAL | 0 refills | Status: DC

## 2021-08-10 MED ORDER — SITAGLIPTIN PHOSPHATE 50 MG PO TABS: 50.0000 mg | ORAL_TABLET | Freq: Every day | ORAL | 1 refills | Status: DC

## 2021-08-10 MED ORDER — METFORMIN HCL ER 750 MG PO TB24: 1500.0000 mg | ORAL_TABLET | Freq: Every day | ORAL | 1 refills | Status: DC

## 2021-08-10 MED ORDER — HYDROCHLOROTHIAZIDE 25 MG PO TABS: 25.0000 mg | ORAL_TABLET | Freq: Every day | ORAL | 1 refills | Status: DC

## 2021-08-10 MED ORDER — MECLIZINE HCL 25 MG PO TABS: 25.0000 mg | ORAL_TABLET | Freq: Four times a day (QID) | ORAL | 0 refills | Status: DC | PRN

## 2021-08-10 MED ORDER — ATORVASTATIN CALCIUM 80 MG PO TABS: 80.0000 mg | ORAL_TABLET | Freq: Every day | ORAL | 1 refills | Status: DC

## 2021-08-10 NOTE — Assessment & Plan Note (Signed)
Chronic, variable Often in feet/associated with job Request for refill of NSAID, voltaren tabs

## 2021-08-10 NOTE — Assessment & Plan Note (Signed)
Consented; VIS previously provided

## 2021-08-10 NOTE — Assessment & Plan Note (Signed)
Chronic, stable At goal, <130/<80 Continue lisinopril 40 mg Continue HCTZ 25 mg Continue Norvasc 10 mg Denies complications

## 2021-08-10 NOTE — Assessment & Plan Note (Signed)
Chronic, variable Increase to 20 mg of Cialis daily; refer to Urology for further assistance

## 2021-08-10 NOTE — Assessment & Plan Note (Signed)
Chronic, previous elevated Doing well with increase from 40 to 80 mg of lipitor I recommend diet low in saturated fat and regular exercise - 30 min at least 5 times per week

## 2021-08-10 NOTE — Assessment & Plan Note (Signed)
Chronic, associated with large weather shifts Request for meclizine refill Refer to ENT if needed

## 2021-08-10 NOTE — Assessment & Plan Note (Signed)
Chronic, improved However, A1c remains >8% Patient agreeable to increase Metformin from 1000 mg regular release to 1500 mg XR to assist Continue to recommend balanced, lower carb meals. Smaller meal size, adding snacks. Choosing water as drink of choice and increasing purposeful exercise. 3 month f/u On AceI On Statin UTD on Eye/Urine micro

## 2021-08-10 NOTE — Assessment & Plan Note (Signed)
Chronic, back at baseline Denies need for referral to nephro at this time Is working on hydration Could add farxiga to assist if needed

## 2021-08-13 ENCOUNTER — Telehealth: Payer: Self-pay

## 2021-08-13 NOTE — Telephone Encounter (Signed)
Copied from [place] [phone]. 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

## 2021-09-04 ENCOUNTER — Other Ambulatory Visit: Payer: Self-pay | Admitting: Family Medicine

## 2021-09-05 ENCOUNTER — Encounter: Payer: Self-pay | Admitting: Urology

## 2021-09-05 ENCOUNTER — Ambulatory Visit: Payer: No Typology Code available for payment source | Admitting: Urology

## 2021-09-05 VITALS — BP 152/92 | HR 97 | Ht 75.0 in | Wt 237.0 lb

## 2021-09-05 DIAGNOSIS — N529 Male erectile dysfunction, unspecified: Secondary | ICD-10-CM | POA: Diagnosis not present

## 2021-09-05 DIAGNOSIS — N5201 Erectile dysfunction due to arterial insufficiency: Secondary | ICD-10-CM | POA: Diagnosis not present

## 2021-09-05 NOTE — Progress Notes (Signed)
09/05/2021 8:27 AM   Timothy Douglas 1970/09/30 [id number]  Referring provider: Gwyneth Douglas, [address] [place],  [place] [address]  Chief Complaint  Patient presents with   Erectile Dysfunction    HPI: Timothy Douglas is a 51 y.o. male referred for erectile dysfunction  > 5 year history of erectile dysfunction Complains of difficulty achieving erection.  Erections are typically not firm enough for penetration though he on occasions has successful intercourse Tadalafil partially effective Denies tiredness, fatigue or decreased libido.  No previous testosterone level No pain or curvature with erections No neurologic history Organic risk factors include hypertension, diabetes, hyperlipidemia and antihypertensive medications No previous tobacco history   PMH: Past Medical History:  Diagnosis Date   DJD (degenerative joint disease)    Essential hypertension    Hyperlipidemia due to type 2 diabetes mellitus (HCC)    Shoulder pain    T2DM (type 2 diabetes mellitus) ([place])    Vertigo    1 episode over 10 yrs ago    Surgical History: Past Surgical History:  Procedure Laterality Date   APPENDECTOMY     COLONOSCOPY WITH PROPOFOL N/A 03/19/2019   Procedure: COLONOSCOPY WITH BIOPSY;  Surgeon: Lucilla Lame, MD;  Location: [facility];  Service: Endoscopy;  Laterality: N/A;  Diabetic - oral meds Priority 4   EYE SURGERY     KELOID EXCISION  1990s   KNEE ARTHROSCOPY Left    LUMBAR DISC SURGERY     LUMBAR FUSION     L5-S1   POLYPECTOMY N/A 03/19/2019   Procedure: POLYPECTOMY;  Surgeon: Lucilla Lame, MD;  Location: [facility];  Service: Endoscopy;  Laterality: N/A;   SPINAL CORD STIMULATOR IMPLANT      Home Medications:  Allergies as of 09/05/2021       Reactions   Other Rash   Tape Rash        Medication List        Accurate as of September 05, 2021  8:27 AM. If you have any questions, ask your nurse or doctor.          amLODipine 10  MG tablet Commonly known as: NORVASC Take 1 tablet (10 mg total) by mouth daily.   atorvastatin 80 MG tablet Commonly known as: LIPITOR Take 1 tablet (80 mg total) by mouth daily.   diclofenac 75 MG EC tablet Commonly known as: VOLTAREN Take 1 tablet (75 mg total) by mouth 2 (two) times daily as needed.   glipiZIDE 10 MG tablet Commonly known as: GLUCOTROL Take 1 tablet (10 mg total) by mouth 2 (two) times daily before a meal.   hydrochlorothiazide 25 MG tablet Commonly known as: HYDRODIURIL Take 1 tablet (25 mg total) by mouth daily.   indomethacin 25 MG capsule Commonly known as: INDOCIN TAKE 1 CAPSULE BY MOUTH THREE TIMES A DAY AS NEEDED   lidocaine 2 % solution Commonly known as: XYLOCAINE Please specify directions, refills and quantity   lisinopril 40 MG tablet Commonly known as: ZESTRIL Take 1 tablet (40 mg total) by mouth daily.   meclizine 25 MG tablet Commonly known as: ANTIVERT Take 1 tablet (25 mg total) by mouth every 6 (six) hours as needed.   metFORMIN 750 MG 24 hr tablet Commonly known as: GLUCOPHAGE-XR Take 2 tablets (1,500 mg total) by mouth daily with breakfast.   multivitamin tablet Take 1 tablet by mouth daily.   sitaGLIPtin 50 MG tablet Commonly known as: Januvia Take 1 tablet (50 mg total) by mouth  daily.   tadalafil 20 MG tablet Commonly known as: CIALIS Take 1 tablet (20 mg total) by mouth daily.   VITAMIN C PO Take by mouth daily.        Allergies:  Allergies  Allergen Reactions   Other Rash   Tape Rash    Family History: Family History  Problem Relation Age of Onset   Hypertension Mother    Bell's palsy Mother    Hypertension Father    Thyroid disease Sister    Thyroid disease Brother    Diabetes Maternal Grandmother    Leukemia Maternal Grandfather    Diabetes Paternal Grandmother    Leukemia Maternal Uncle    Congestive Heart Failure Paternal Great-grandfather    Breast cancer Neg Hx    Prostate cancer Neg Hx     Colon cancer Neg Hx     Social History:  reports that he has never smoked. He has never used smokeless tobacco. He reports current alcohol use of about 2.0 - 3.0 standard drinks of alcohol per week. He reports that he does not use drugs.   Physical Exam: BP (!) 152/92   Pulse 97   Ht '6\' 3"'$  (1.905 m)   Wt 237 lb (107.5 kg)   BMI 29.62 kg/m   Constitutional:  Alert and oriented, No acute distress. HEENT: [place] AT, moist mucus membranes.  Trachea midline, no masses. Cardiovascular: No clubbing, cyanosis, or edema. Respiratory: Normal respiratory effort, no increased work of breathing. GI: Abdomen is soft, nontender, nondistended, no abdominal masses GU: Phallus without lesions/plaques.  Testes descended bilaterally without masses or tenderness.  Estimated volume 20 cc bilaterally. Skin: No rashes, bruises or suspicious lesions. Neurologic: Grossly intact, no focal deficits, moving all 4 extremities. Psychiatric: Normal mood and affect.   Assessment & Plan:    1.  Erectile dysfunction Significant organic risk factors We discussed ~ 70% of patients with ED is due to vascular etiology PDE 5 inhibitor refractory We discussed second line options including intracavernosal injections and vacuum erection devices.  He was provided website information. Penile implant surgery was briefly discussed He will review the literature and call back if interested in pursuing any of the second line options Check baseline testosterone/LH    Abbie Sons, MD  [facility] [address], [place], [place] [address] [phone]

## 2021-09-06 ENCOUNTER — Encounter: Payer: Self-pay | Admitting: Urology

## 2021-09-06 LAB — TESTOSTERONE: Testosterone: 437 ng/dL (ref 264–916)

## 2021-09-06 LAB — LUTEINIZING HORMONE: LH: 6.7 m[IU]/mL (ref 1.7–8.6)

## 2021-09-07 ENCOUNTER — Encounter: Payer: Self-pay | Admitting: *Deleted

## 2021-10-02 ENCOUNTER — Other Ambulatory Visit: Payer: Self-pay | Admitting: Family Medicine

## 2021-10-21 ENCOUNTER — Other Ambulatory Visit: Payer: Self-pay | Admitting: Family Medicine

## 2021-10-21 DIAGNOSIS — E1169 Type 2 diabetes mellitus with other specified complication: Secondary | ICD-10-CM

## 2021-11-03 ENCOUNTER — Other Ambulatory Visit: Payer: Self-pay | Admitting: Family Medicine

## 2021-11-03 DIAGNOSIS — I10 Essential (primary) hypertension: Secondary | ICD-10-CM

## 2021-11-04 ENCOUNTER — Other Ambulatory Visit: Payer: Self-pay | Admitting: Family Medicine

## 2021-11-13 ENCOUNTER — Ambulatory Visit: Payer: No Typology Code available for payment source | Admitting: Family Medicine

## 2021-11-13 ENCOUNTER — Encounter: Payer: Self-pay | Admitting: Family Medicine

## 2021-11-13 VITALS — BP 122/84 | HR 87 | Temp 98.0°F | Resp 16 | Wt 244.0 lb

## 2021-11-13 DIAGNOSIS — Z23 Encounter for immunization: Secondary | ICD-10-CM | POA: Diagnosis not present

## 2021-11-13 DIAGNOSIS — E1159 Type 2 diabetes mellitus with other circulatory complications: Secondary | ICD-10-CM | POA: Diagnosis not present

## 2021-11-13 DIAGNOSIS — N1831 Chronic kidney disease, stage 3a: Secondary | ICD-10-CM

## 2021-11-13 DIAGNOSIS — E1169 Type 2 diabetes mellitus with other specified complication: Secondary | ICD-10-CM

## 2021-11-13 DIAGNOSIS — E785 Hyperlipidemia, unspecified: Secondary | ICD-10-CM | POA: Diagnosis not present

## 2021-11-13 DIAGNOSIS — I152 Hypertension secondary to endocrine disorders: Secondary | ICD-10-CM | POA: Diagnosis not present

## 2021-11-13 LAB — POCT GLYCOSYLATED HEMOGLOBIN (HGB A1C)
Est. average glucose Bld gHb Est-mCnc: 214
Hemoglobin A1C: 9.1 % — AB (ref 4.0–5.6)

## 2021-11-13 MED ORDER — OZEMPIC (0.25 OR 0.5 MG/DOSE) 2 MG/3ML ~~LOC~~ SOPN: PEN_INJECTOR | SUBCUTANEOUS | 1 refills | Status: DC

## 2021-11-13 NOTE — Assessment & Plan Note (Signed)
Chronic and stable Recheck metabolic panel Avoid nephrotoxic meds  

## 2021-11-13 NOTE — Assessment & Plan Note (Signed)
Previously well controlled Continue statin Repeat FLP and CMP annually Goal LDL < 70  

## 2021-11-13 NOTE — Assessment & Plan Note (Signed)
Chronic and uncontrolled, with hyperglycemia Complicated with HTN and HLD Continue metformin and glipizide at current dose Add Ozempic 0.'25mg'$  weekly and increase to 0.5 mg weekly after first 2 weeks Consider adding jardiance in the future Stop januvia UTD on vaccines, eye exam (will send ROI), foot exam (completed today) On ACEi/ARB On Statin Discussed diet and exercise F/u in 3 months

## 2021-11-13 NOTE — Assessment & Plan Note (Signed)
Well controlled Continue current medications Recheck metabolic panel F/u in 6 months  

## 2021-11-13 NOTE — Progress Notes (Signed)
I,Sulibeya S Dimas,acting as a Education administrator for Lavon Paganini, MD.,have documented all relevant documentation on the behalf of Lavon Paganini, MD,as directed by  Lavon Paganini, MD while in the presence of Lavon Paganini, MD.     Established patient visit   Patient: Timothy Douglas   DOB: July 16, 1970   51 y.o. Male  MRN: [id number] Visit Date: 11/13/2021  Today's healthcare provider: Lavon Paganini, MD   Chief Complaint  Patient presents with   Diabetes   Hypertension   Subjective    HPI  Diabetes Mellitus Type II, Follow-up  Lab Results  Component Value Date   HGBA1C 9.1 (A) 11/13/2021   HGBA1C 8.7 (A) 08/10/2021   HGBA1C 9.3 (H) 05/11/2021   Wt Readings from Last 3 Encounters:  11/13/21 244 lb (110.7 kg)  09/05/21 237 lb (107.5 kg)  08/10/21 244 lb (110.7 kg)   Last seen for diabetes 3 months ago.  Management since then includes increase metformin from 1000 mg to 1500 mg XR. He reports excellent compliance with treatment. He is not having side effects.  Symptoms: No fatigue No foot ulcerations  No appetite changes No nausea  No paresthesia of the feet  No polydipsia  No polyuria No visual disturbances   No vomiting     Home blood sugar records:  avarage  Episodes of hypoglycemia? No    Current insulin regiment: none Most Recent Eye Exam:  Current exercise: walking Current diet habits: in general, a "healthy" diet    Pertinent Labs: Lab Results  Component Value Date   CHOL 124 05/11/2021   HDL 40 05/11/2021   LDLCALC 68 05/11/2021   TRIG 82 05/11/2021   CHOLHDL 3.1 05/11/2021   Lab Results  Component Value Date   NA 137 05/11/2021   K 5.1 05/11/2021   CREATININE 1.43 (H) 05/11/2021   EGFR 59 (L) 05/11/2021   MICROALBUR <3.0 03/01/2017   LABMICR <3.0 05/11/2021     --------------------------------------------------------------------------------------------------- Hypertension, follow-up  BP Readings from Last 3 Encounters:   11/13/21 122/84  09/05/21 (!) 152/92  08/10/21 115/79   Wt Readings from Last 3 Encounters:  11/13/21 244 lb (110.7 kg)  09/05/21 237 lb (107.5 kg)  08/10/21 244 lb (110.7 kg)     He was last seen for hypertension 3 months ago.  BP at that visit was 115/79. Management since that visit includes no change. Continue lisinopril 40 mg Continue HCTZ 25 mg. Continue Norvasc 10 mg  He reports excellent compliance with treatment. He is not having side effects.  He is following a Regular diet. He is exercising. He does not smoke.  Use of agents associated with hypertension: none.   Outside blood pressures are stable. Symptoms: No chest pain No chest pressure  No palpitations No syncope  No dyspnea No orthopnea  No paroxysmal nocturnal dyspnea No lower extremity edema   Pertinent labs Lab Results  Component Value Date   CHOL 124 05/11/2021   HDL 40 05/11/2021   LDLCALC 68 05/11/2021   TRIG 82 05/11/2021   CHOLHDL 3.1 05/11/2021   Lab Results  Component Value Date   NA 137 05/11/2021   K 5.1 05/11/2021   CREATININE 1.43 (H) 05/11/2021   EGFR 59 (L) 05/11/2021   GLUCOSE 239 (H) 05/11/2021   TSH 1.020 03/02/2020     The ASCVD Risk score (Arnett DK, et al., 2019) failed to calculate for the following reasons:   The valid total cholesterol range is 130 to 320 mg/dL  ---------------------------------------------------------------------------------------------------   Medications:  Outpatient Medications Prior to Visit  Medication Sig   amLODipine (NORVASC) 10 MG tablet TAKE 1 TABLET BY MOUTH EVERY DAY   Ascorbic Acid (VITAMIN C PO) Take by mouth daily.   atorvastatin (LIPITOR) 80 MG tablet Take 1 tablet (80 mg total) by mouth daily.   diclofenac (VOLTAREN) 75 MG EC tablet Take 1 tablet (75 mg total) by mouth 2 (two) times daily as needed.   glipiZIDE (GLUCOTROL) 10 MG tablet Take 1 tablet (10 mg total) by mouth 2 (two) times daily before a meal.   hydrochlorothiazide  (HYDRODIURIL) 25 MG tablet Take 1 tablet (25 mg total) by mouth daily.   indomethacin (INDOCIN) 25 MG capsule TAKE 1 CAPSULE BY MOUTH THREE TIMES A DAY AS NEEDED   lidocaine (XYLOCAINE) 2 % solution Please specify directions, refills and quantity   lisinopril (ZESTRIL) 40 MG tablet Take 1 tablet (40 mg total) by mouth daily.   meclizine (ANTIVERT) 25 MG tablet Take 1 tablet (25 mg total) by mouth every 6 (six) hours as needed.   metFORMIN (GLUCOPHAGE-XR) 750 MG 24 hr tablet Take 2 tablets (1,500 mg total) by mouth daily with breakfast.   Multiple Vitamin (MULTIVITAMIN) tablet Take 1 tablet by mouth daily.   tadalafil (CIALIS) 20 MG tablet Take 1 tablet (20 mg total) by mouth daily.   [DISCONTINUED] sitaGLIPtin (JANUVIA) 50 MG tablet Take 1 tablet (50 mg total) by mouth daily.   No facility-administered medications prior to visit.    Review of Systems per HPI      Objective    BP 122/84 (BP Location: Left Arm, Patient Position: Sitting, Cuff Size: Large)   Pulse 87   Temp 98 F (36.7 C) (Oral)   Resp 16   Wt 244 lb (110.7 kg)   BMI 30.50 kg/m  BP Readings from Last 3 Encounters:  11/13/21 122/84  09/05/21 (!) 152/92  08/10/21 115/79   Wt Readings from Last 3 Encounters:  11/13/21 244 lb (110.7 kg)  09/05/21 237 lb (107.5 kg)  08/10/21 244 lb (110.7 kg)      Physical Exam Vitals reviewed.  Constitutional:      General: He is not in acute distress.    Appearance: Normal appearance. He is not diaphoretic.  HENT:     Head: Normocephalic and atraumatic.  Eyes:     General: No scleral icterus.    Conjunctiva/sclera: Conjunctivae normal.  Cardiovascular:     Rate and Rhythm: Normal rate and regular rhythm.     Pulses: Normal pulses.     Heart sounds: Normal heart sounds. No murmur heard. Pulmonary:     Effort: Pulmonary effort is normal. No respiratory distress.     Breath sounds: Normal breath sounds. No wheezing or rhonchi.  Musculoskeletal:     Cervical back: Neck  supple.     Right lower leg: No edema.     Left lower leg: No edema.  Lymphadenopathy:     Cervical: No cervical adenopathy.  Skin:    General: Skin is warm and dry.     Findings: No rash.  Neurological:     Mental Status: He is alert and oriented to person, place, and time. Mental status is at baseline.  Psychiatric:        Mood and Affect: Mood normal.        Behavior: Behavior normal.       Results for orders placed or performed in visit on 11/13/21  POCT glycosylated hemoglobin (Hb A1C)  Result Value Ref Range  Hemoglobin A1C 9.1 (A) 4.0 - 5.6 %   Est. average glucose Bld gHb Est-mCnc 214     Assessment & Plan     Problem List Items Addressed This Visit       Cardiovascular and Mediastinum   Hypertension associated with diabetes ([facility]) - Primary    Well controlled Continue current medications Recheck metabolic panel F/u in 6 months       Relevant Medications   Semaglutide,0.25 or 0.5MG/DOS, (OZEMPIC, 0.25 OR 0.5 MG/DOSE,) 2 MG/3ML SOPN   Other Relevant Orders   Basic Metabolic Panel (BMET)     Endocrine   T2DM (type 2 diabetes mellitus) (HCC)    Chronic and uncontrolled, with hyperglycemia Complicated with HTN and HLD Continue metformin and glipizide at current dose Add Ozempic 0.19m weekly and increase to 0.5 mg weekly after first 2 weeks Consider adding jardiance in the future Stop januvia UTD on vaccines, eye exam (will send ROI), foot exam (completed today) On ACEi/ARB On Statin Discussed diet and exercise F/u in 3 months       Relevant Medications   Semaglutide,0.25 or 0.5MG/DOS, (OZEMPIC, 0.25 OR 0.5 MG/DOSE,) 2 MG/3ML SOPN   Other Relevant Orders   POCT glycosylated hemoglobin (Hb A1C) (Completed)   Hyperlipidemia due to type 2 diabetes mellitus (HCC)    Previously well controlled Continue statin Repeat FLP and CMP annually Goal LDL < 70      Relevant Medications   Semaglutide,0.25 or 0.5MG/DOS, (OZEMPIC, 0.25 OR 0.5 MG/DOSE,) 2 MG/3ML  SOPN     Genitourinary   Stage 3a chronic kidney disease (HCC)    Chronic and stable Recheck metabolic panel Avoid nephrotoxic meds       Relevant Orders   Basic Metabolic Panel (BMET)     Return in about 3 months (around 02/13/2022) for chronic disease f/u.      I, ALavon Paganini MD, have reviewed all documentation for this visit. The documentation on 11/13/21 for the exam, diagnosis, procedures, and orders are all accurate and complete.   Shivan Hodes, ADionne Bucy MD, MPH [facility]

## 2021-11-14 LAB — BASIC METABOLIC PANEL
BUN/Creatinine Ratio: 15 (ref 9–20)
BUN: 23 mg/dL (ref 6–24)
CO2: 24 mmol/L (ref 20–29)
Calcium: 10.4 mg/dL — ABNORMAL HIGH (ref 8.7–10.2)
Chloride: 99 mmol/L (ref 96–106)
Creatinine, Ser: 1.51 mg/dL — ABNORMAL HIGH (ref 0.76–1.27)
Glucose: 159 mg/dL — ABNORMAL HIGH (ref 70–99)
Potassium: 4.5 mmol/L (ref 3.5–5.2)
Sodium: 137 mmol/L (ref 134–144)
eGFR: 55 mL/min/{1.73_m2} — ABNORMAL LOW (ref >59)

## 2021-11-15 ENCOUNTER — Telehealth: Payer: Self-pay

## 2021-11-15 MED ORDER — OZEMPIC (0.25 OR 0.5 MG/DOSE) 2 MG/3ML ~~LOC~~ SOPN: PEN_INJECTOR | SUBCUTANEOUS | 1 refills | Status: AC

## 2021-11-15 NOTE — Telephone Encounter (Addendum)
Pt given lab results per notes of Dr. Jacinto Reap on 11/15/21. Pt verbalized understanding. Pt wanted to let Dr. B know that the Ozempic was not in stock at pharmacy but did say they told him somewhere in [place] it was. I advised him to call [facility] back and see what location it was in stock in case we need to send to different location. Pt verbalized understanding.

## 2021-11-15 NOTE — Addendum Note (Signed)
Addended by: Randal Buba on: 11/15/2021 05:32 PM   Modules accepted: Orders

## 2021-11-15 NOTE — Telephone Encounter (Signed)
Pt returned call and provided information. Please send to [facility] at [address] in [place]. Thank you.

## 2021-11-15 NOTE — Telephone Encounter (Signed)
Prescription sent to requested pharmacy. 

## 2021-12-07 ENCOUNTER — Other Ambulatory Visit: Payer: Self-pay | Admitting: Family Medicine

## 2021-12-24 ENCOUNTER — Ambulatory Visit
Admission: RE | Admit: 2021-12-24 | Discharge: 2021-12-24 | Disposition: A | Discharge status: Home / Self Care | Payer: No Typology Code available for payment source | Source: Ambulatory Visit | Attending: Emergency Medicine | Admitting: Emergency Medicine

## 2021-12-24 ENCOUNTER — Other Ambulatory Visit: Payer: Self-pay

## 2021-12-24 VITALS — BP 143/95 | HR 90 | Temp 97.9°F | Resp 18

## 2021-12-24 DIAGNOSIS — R051 Acute cough: Secondary | ICD-10-CM | POA: Diagnosis not present

## 2021-12-24 DIAGNOSIS — J0100 Acute maxillary sinusitis, unspecified: Secondary | ICD-10-CM | POA: Diagnosis not present

## 2021-12-24 DIAGNOSIS — I10 Essential (primary) hypertension: Secondary | ICD-10-CM | POA: Diagnosis not present

## 2021-12-24 MED ORDER — AMOXICILLIN 875 MG PO TABS: 875.0000 mg | ORAL_TABLET | Freq: Two times a day (BID) | ORAL | 0 refills | Status: AC

## 2021-12-24 MED ORDER — PREDNISONE 10 MG PO TABS: 40.0000 mg | ORAL_TABLET | Freq: Every day | ORAL | 0 refills | Status: AC

## 2021-12-24 NOTE — Discharge Instructions (Addendum)
Take the amoxicillin and prednisone as directed.  Monitor your blood sugar often while taking the prednisone.  Follow up with your primary care provider if your symptoms are not improving.    Your blood pressure is elevated today at 143/95.  Please have this rechecked by your primary care provider in 2-4 weeks.

## 2021-12-24 NOTE — ED Triage Notes (Signed)
Patient is concerned for sinus infection.  Patient has taken dayquil nyquil, sinus medications and other cough syrups.  Patient's primary complaint is a cough.  Minimal phlegm production

## 2021-12-24 NOTE — ED Provider Notes (Signed)
[facility]    CSN: [id number] Arrival date & time: 12/24/21  1638      History   Chief Complaint Chief Complaint  Patient presents with   Cough    17:00   Appointment    HPI Timothy Douglas is a 51 y.o. male.  Patient presents with 10-day history of sinus congestion, postnasal drip, cough.  Several OTC treatments attempted without relief.  He denies fever, chills, sore throat, ear pain, shortness of breath, chest pain, vomiting, diarrhea, or other symptoms.  His medical history includes diabetes, hypertension, CKD.  The history is provided by the patient and medical records.    Past Medical History:  Diagnosis Date   DJD (degenerative joint disease)    Essential hypertension    Hyperlipidemia due to type 2 diabetes mellitus (HCC)    Shoulder pain    T2DM (type 2 diabetes mellitus) ([place])    Vertigo    1 episode over 10 yrs ago    Patient Active Problem List   Diagnosis Date Noted   Arthritis 08/10/2021   Vertigo 08/10/2021   Stage 3a chronic kidney disease ([place]) 08/10/2021   Osteoarthritis of first metatarsophalangeal (MTP) joint of left foot 05/17/2021   Calcific Achilles tendinitis of left lower extremity 04/19/2021   Plantar fasciitis, left 04/19/2021   Achilles tendinosis 04/05/2021   Bursitis of left shoulder 11/18/2019   Special screening for malignant neoplasms, colon    Polyp of transverse colon    Family history of thyroid disease 02/05/2019   Family history of leukemia 02/05/2019   Decreased libido 02/05/2019   Erectile dysfunction 02/05/2019   T2DM (type 2 diabetes mellitus) ([place])    Hypertension associated with diabetes ([place])    Hyperlipidemia due to type 2 diabetes mellitus ([place])    Spondylolisthesis of lumbar region 04/08/2014    Past Surgical History:  Procedure Laterality Date   APPENDECTOMY     COLONOSCOPY WITH PROPOFOL N/A 03/19/2019   Procedure: COLONOSCOPY WITH BIOPSY;  Surgeon: Lucilla Lame, MD;  Location: [facility];  Service: Endoscopy;  Laterality: N/A;  Diabetic - oral meds Priority 4   EYE SURGERY     KELOID EXCISION  1990s   KNEE ARTHROSCOPY Left    LUMBAR DISC SURGERY     LUMBAR FUSION     L5-S1   POLYPECTOMY N/A 03/19/2019   Procedure: POLYPECTOMY;  Surgeon: Lucilla Lame, MD;  Location: [facility];  Service: Endoscopy;  Laterality: N/A;   SPINAL CORD STIMULATOR IMPLANT         Home Medications    Prior to Admission medications   Medication Sig Start Date End Date Taking? Authorizing Provider  amoxicillin (AMOXIL) 875 MG tablet Take 1 tablet (875 mg total) by mouth 2 (two) times daily for 10 days. 12/24/21 01/03/22 Yes Sharion Balloon, NP  predniSONE (DELTASONE) 10 MG tablet Take 4 tablets (40 mg total) by mouth daily for 3 days. 12/24/21 12/27/21 Yes Sharion Balloon, NP  amLODipine (NORVASC) 10 MG tablet TAKE 1 TABLET BY MOUTH EVERY DAY 11/05/21   Bacigalupo, Dionne Bucy, MD  Ascorbic Acid (VITAMIN C PO) Take by mouth daily.    [provider]  atorvastatin (LIPITOR) 80 MG tablet Take 1 tablet (80 mg total) by mouth daily. 08/10/21   [name], FNP  diclofenac (VOLTAREN) 75 MG EC tablet Take 1 tablet (75 mg total) by mouth 2 (two) times daily as needed. Patient not taking: Reported on 12/24/2021 08/10/21   Tally Joe  T, FNP  glipiZIDE (GLUCOTROL) 10 MG tablet Take 1 tablet (10 mg total) by mouth 2 (two) times daily before a meal. 07/02/21   Bacigalupo, Dionne Bucy, MD  hydrochlorothiazide (HYDRODIURIL) 25 MG tablet Take 1 tablet (25 mg total) by mouth daily. 08/10/21   [name], FNP  indomethacin (INDOCIN) 25 MG capsule TAKE 1 CAPSULE BY MOUTH THREE TIMES A DAY AS NEEDED 12/07/21   Bacigalupo, Dionne Bucy, MD  lidocaine (XYLOCAINE) 2 % solution Please specify directions, refills and quantity 03/13/20   Virginia Crews, MD  lisinopril (ZESTRIL) 40 MG tablet Take 1 tablet (40 mg total) by mouth daily. 08/10/21   [name], FNP  meclizine (ANTIVERT) 25 MG tablet Take 1  tablet (25 mg total) by mouth every 6 (six) hours as needed. 08/10/21   [name], FNP  metFORMIN (GLUCOPHAGE-XR) 750 MG 24 hr tablet Take 2 tablets (1,500 mg total) by mouth daily with breakfast. 08/10/21   [name], FNP  Multiple Vitamin (MULTIVITAMIN) tablet Take 1 tablet by mouth daily.    [provider]  Semaglutide,0.25 or 0.'5MG'$ /DOS, (OZEMPIC, 0.25 OR 0.5 MG/DOSE,) 2 MG/3ML SOPN Inject 0.25 mg into the skin once a week for 14 days, THEN 0.5 mg once a week. 11/15/21 02/07/22  Virginia Crews, MD  tadalafil (CIALIS) 20 MG tablet Take 1 tablet (20 mg total) by mouth daily. 08/10/21   [name], FNP    Family History Family History  Problem Relation Age of Onset   Hypertension Mother    Bell's palsy Mother    Hypertension Father    Thyroid disease Sister    Thyroid disease Brother    Diabetes Maternal Grandmother    Leukemia Maternal Grandfather    Diabetes Paternal Grandmother    Leukemia Maternal Uncle    Congestive Heart Failure Paternal Great-grandfather    Breast cancer Neg Hx    Prostate cancer Neg Hx    Colon cancer Neg Hx     Social History Social History   Tobacco Use   Smoking status: Never   Smokeless tobacco: Never  Vaping Use   Vaping Use: Never used  Substance Use Topics   Alcohol use: Yes    Alcohol/week: 2.0 - 3.0 standard drinks of alcohol    Types: 1 - 2 Glasses of wine, 1 Cans of beer per week   Drug use: Never     Allergies   Other and Tape   Review of Systems Review of Systems  Constitutional:  Negative for chills and fever.  HENT:  Positive for congestion and postnasal drip. Negative for ear pain and sore throat.   Respiratory:  Positive for cough. Negative for shortness of breath.   Cardiovascular:  Negative for chest pain and palpitations.  Gastrointestinal:  Negative for diarrhea and vomiting.  Skin:  Negative for color change and rash.  All other systems reviewed and are negative.    Physical Exam Triage  Vital Signs ED Triage Vitals [12/24/21 1714]  Enc Vitals Group     BP      Pulse Rate 90     Resp 18     Temp 97.9 F (36.6 C)     Temp src      SpO2 95 %     Weight      Height      Head Circumference      Peak Flow      Pain Score      Pain Loc  Pain Edu?      Excl. in [place]?    No data found.  Updated Vital Signs BP (!) 143/95 (BP Location: Left Arm) Comment (BP Location): large cuff  Pulse 90   Temp 97.9 F (36.6 C)   Resp 18   SpO2 95%   Visual Acuity Right Eye Distance:   Left Eye Distance:   Bilateral Distance:    Right Eye Near:   Left Eye Near:    Bilateral Near:     Physical Exam Vitals and nursing note reviewed.  Constitutional:      General: He is not in acute distress.    Appearance: Normal appearance. He is well-developed. He is not ill-appearing.  HENT:     Right Ear: Tympanic membrane normal.     Left Ear: Tympanic membrane normal.     Nose: Congestion present.     Mouth/Throat:     Mouth: Mucous membranes are moist.     Pharynx: Oropharynx is clear.  Cardiovascular:     Rate and Rhythm: Normal rate and regular rhythm.     Heart sounds: Normal heart sounds.  Pulmonary:     Effort: Pulmonary effort is normal. No respiratory distress.     Breath sounds: Normal breath sounds.  Musculoskeletal:     Cervical back: Neck supple.  Skin:    General: Skin is warm and dry.  Neurological:     Mental Status: He is alert.  Psychiatric:        Mood and Affect: Mood normal.        Behavior: Behavior normal.      UC Treatments / Results  Labs (all labs ordered are listed, but only abnormal results are displayed) Labs Reviewed - No data to display  EKG   Radiology No results found.  Procedures Procedures (including critical care time)  Medications Ordered in UC Medications - No data to display  Initial Impression / Assessment and Plan / UC Course  I have reviewed the triage vital signs and the nursing notes.  Pertinent labs &  imaging results that were available during my care of the patient were reviewed by me and considered in my medical decision making (see chart for details).   Acute sinusitis, cough, elevated blood pressure reading with hypertension.  Patient has been symptomatic for 10 days and is not improving with OTC treatment.  Treating today with amoxicillin.  Per patient request, also treating with prednisone; caution patient to check his blood sugar frequently while on this medication.  Instructed him to follow-up with his PCP if his symptoms are not improving.  Also discussed with patient that his blood pressure is elevated today and needs to be rechecked by PCP in 2 to 4 weeks.  Education provided on managing hypertension. He agrees to plan of care.     Final Clinical Impressions(s) / UC Diagnoses   Final diagnoses:  Acute non-recurrent maxillary sinusitis  Acute cough  Elevated blood pressure reading in office with diagnosis of hypertension     Discharge Instructions      Take the amoxicillin and prednisone as directed.  Monitor your blood sugar often while taking the prednisone.  Follow up with your primary care provider if your symptoms are not improving.    Your blood pressure is elevated today at 143/95.  Please have this rechecked by your primary care provider in 2-4 weeks.          ED Prescriptions     Medication Sig Dispense Auth. Provider  amoxicillin (AMOXIL) 875 MG tablet Take 1 tablet (875 mg total) by mouth 2 (two) times daily for 10 days. 20 tablet Sharion Balloon, NP   predniSONE (DELTASONE) 10 MG tablet Take 4 tablets (40 mg total) by mouth daily for 3 days. 12 tablet Sharion Balloon, NP      PDMP not reviewed this encounter.   Sharion Balloon, NP 12/24/21 1758

## 2021-12-26 ENCOUNTER — Other Ambulatory Visit: Payer: Self-pay | Admitting: Family Medicine

## 2021-12-26 DIAGNOSIS — N529 Male erectile dysfunction, unspecified: Secondary | ICD-10-CM

## 2022-01-02 ENCOUNTER — Other Ambulatory Visit: Payer: Self-pay | Admitting: Family Medicine

## 2022-01-02 NOTE — Telephone Encounter (Signed)
Requested Prescriptions  Pending Prescriptions Disp Refills   glipiZIDE (GLUCOTROL) 10 MG tablet [Pharmacy Med Name: GLIPIZIDE 10 MG TABLET] 180 tablet 1    Sig: TAKE 1 TABLET (10 MG TOTAL) BY MOUTH TWICE A DAY BEFORE A MEAL     Endocrinology:  Diabetes - Sulfonylureas Failed - 01/02/2022  1:44 AM      Failed - HBA1C is between 0 and 7.9 and within 180 days    Hemoglobin A1C  Date Value Ref Range Status  11/13/2021 9.1 (A) 4.0 - 5.6 % Final  11/28/2018 7.7  Final   Hgb A1c MFr Bld  Date Value Ref Range Status  05/11/2021 9.3 (H) 4.8 - 5.6 % Final    Comment:             Prediabetes: 5.7 - 6.4          Diabetes: >6.4          Glycemic control for adults with diabetes: <7.0          Failed - Cr in normal range and within 360 days    Creatinine, Ser  Date Value Ref Range Status  11/13/2021 1.51 (H) 0.76 - 1.27 mg/dL Final         Passed - Valid encounter within last 6 months    Recent Outpatient Visits           1 month ago Hypertension associated with diabetes [facility])   [facility] [place], Dionne Bucy, MD   4 months ago Type 2 diabetes mellitus with other specified complication, without long-term current use of insulin [facility])   [facility] [name], FNP   7 months ago Osteoarthritis of first metatarsophalangeal (MTP) joint of left foot   [facility], Earley Abide, MD   7 months ago Type 2 diabetes mellitus with other specified complication, without long-term current use of insulin [facility])   [facility], Dionne Bucy, MD   8 months ago Achilles tendinitis of left lower extremity   [place] [facility], Earley Abide, MD       Future Appointments             In 1 month Bacigalupo, Dionne Bucy, MD [facility], [place]

## 2022-01-04 ENCOUNTER — Ambulatory Visit: Payer: Self-pay

## 2022-01-04 NOTE — Telephone Encounter (Signed)
Summary: continued cough   Pt called saying he went to an urgent care a week ago for a cough and congestion.  He is taking an antibiotic and cough med.  He said he is still having a cough and wants to know if he can get a better cough medication.  Please advise  CB@ [phone]       Chief Complaint: cough, requesting cough medication  Symptoms: last seen UC 12/24/21 for cough and URI sx completed course of amoxicillin and prednisone. Cough constant post nasal drip . Reports feeling better but can not get rid of cough even taking cough drops, warm liquids.  Frequency: since 12/24/21 Pertinent Negatives: Patient denies chest pain no difficulty breathing no fever no other sx other than constant cough.  Disposition: '[]'$ ED /'[]'$ Urgent Care (no appt availability in office) / '[]'$ Appointment(In office/virtual)/ '[]'$  [facility]/ '[x]'$ Home Care/ '[]'$ Refused Recommended Disposition /'[]'$ [facility]/ '[x]'$  Follow-up with PCP Additional Notes:   Recommended appt, none available until Jan. Patient requesting cough medication , wife took cough med with codiene and able to get rid of her cough. Patient requesting if medication will work for him. Please advise .        Reason for Disposition  Cough  Answer Assessment - Initial Assessment Questions 1. ONSET: "When did the cough begin?"      Last week  2. SEVERITY: "How bad is the cough today?"      Constant cough post nasal drip  3. SPUTUM: "Describe the color of your sputum" (none, dry cough; clear, white, yellow, green)     na 4. HEMOPTYSIS: "Are you coughing up any blood?" If so ask: "How much?" (flecks, streaks, tablespoons, etc.)     No  5. DIFFICULTY BREATHING: "Are you having difficulty breathing?" If Yes, ask: "How bad is it?" (e.g., mild, moderate, severe)    - MILD: No SOB at rest, mild SOB with walking, speaks normally in sentences, can lie down, no retractions, pulse < 100.    - MODERATE: SOB at rest, SOB with minimal  exertion and prefers to sit, cannot lie down flat, speaks in phrases, mild retractions, audible wheezing, pulse 100-120.    - SEVERE: Very SOB at rest, speaks in single words, struggling to breathe, sitting hunched forward, retractions, pulse > 120      Denies shortness of breath  6. FEVER: "Do you have a fever?" If Yes, ask: "What is your temperature, how was it measured, and when did it start?"     no 7. CARDIAC HISTORY: "Do you have any history of heart disease?" (e.g., heart attack, congestive heart failure)      na 8. LUNG HISTORY: "Do you have any history of lung disease?"  (e.g., pulmonary embolus, asthma, emphysema)     na 9. PE RISK FACTORS: "Do you have a history of blood clots?" (or: recent major surgery, recent prolonged travel, bedridden)     na 10. OTHER SYMPTOMS: "Do you have any other symptoms?" (e.g., runny nose, wheezing, chest pain)       Constant cough mostly non productive , has been seen in UC and completed course of amoxicillin and prednisone  and feels better just can not get rid of cough 11. PREGNANCY: "Is there any chance you are pregnant?" "When was your last menstrual period?"       na 12. TRAVEL: "Have you traveled out of the country in the last month?" (e.g., travel history, exposures)       na  Protocols used: Cough - Acute Productive-A-AH

## 2022-01-04 NOTE — Telephone Encounter (Signed)
Message from Williston Callas sent at 01/04/2022 10:59 AM EST  Summary: continued cough   Pt called saying he went to an urgent care a week ago for a cough and congestion.  He is taking an antibiotic and cough med.  He said he is still having a cough and wants to know if he can get a better cough medication.  Please advise  CB@ [phone]        Called pt and LM on VM to call back. Needs hospital f/u appt since in UC

## 2022-01-05 ENCOUNTER — Other Ambulatory Visit: Payer: Self-pay | Admitting: Family Medicine

## 2022-01-07 NOTE — Telephone Encounter (Signed)
Patient advised as below. Patient declined appointment.

## 2022-02-04 ENCOUNTER — Other Ambulatory Visit: Payer: Self-pay | Admitting: Family Medicine

## 2022-02-04 DIAGNOSIS — E1169 Type 2 diabetes mellitus with other specified complication: Secondary | ICD-10-CM

## 2022-02-08 ENCOUNTER — Other Ambulatory Visit: Payer: Self-pay | Admitting: Family Medicine

## 2022-02-08 NOTE — Telephone Encounter (Signed)
Requested Prescriptions  Pending Prescriptions Disp Refills   indomethacin (INDOCIN) 25 MG capsule [Pharmacy Med Name: INDOMETHACIN 25 MG CAPSULE] 90 capsule 0    Sig: TAKE 1 CAPSULE BY MOUTH THREE TIMES A DAY AS NEEDED     Analgesics:  NSAIDS Failed - 02/08/2022  1:49 AM      Failed - Manual Review: Labs are only required if the patient has taken medication for more than 8 weeks.      Failed - Cr in normal range and within 360 days    Creatinine, Ser  Date Value Ref Range Status  11/13/2021 1.51 (H) 0.76 - 1.27 mg/dL Final         Passed - HGB in normal range and within 360 days    Hemoglobin  Date Value Ref Range Status  05/11/2021 15.4 13.0 - 17.7 g/dL Final         Passed - PLT in normal range and within 360 days    Platelets  Date Value Ref Range Status  05/11/2021 225 150 - 450 x10E3/uL Final         Passed - HCT in normal range and within 360 days    Hematocrit  Date Value Ref Range Status  05/11/2021 44.6 37.5 - 51.0 % Final         Passed - eGFR is 30 or above and within 360 days    GFR calc Af Amer  Date Value Ref Range Status  03/02/2020 38 (L) >59 mL/min/1.73 Final    Comment:    **In accordance with recommendations from the NKF-ASN Task force,**   [facility] is in the process of updating its eGFR calculation to the   2021 CKD-EPI creatinine equation that estimates kidney function   without a race variable.    GFR, Estimated  Date Value Ref Range Status  03/09/2020 42 (L) >60 mL/min Final    Comment:    (NOTE) Calculated using the CKD-EPI Creatinine Equation (2021)    eGFR  Date Value Ref Range Status  11/13/2021 55 (L) >59 mL/min/1.73 Final         Passed - Patient is not pregnant      Passed - Valid encounter within last 12 months    Recent Outpatient Visits           2 months ago Hypertension associated with diabetes [facility])   [place], Dionne Bucy, MD   6 months ago Type 2 diabetes mellitus with other specified  complication, without long-term current use of insulin [facility])   [facility] Tally Joe T, FNP   8 months ago Osteoarthritis of first metatarsophalangeal (MTP) joint of left foot   [place] [facility], Earley Abide, MD   9 months ago Type 2 diabetes mellitus with other specified complication, without long-term current use of insulin [facility])   [facility], Dionne Bucy, MD   9 months ago Achilles tendinitis of left lower extremity   [place] [facility] [place], Earley Abide, MD       Future Appointments             In 6 days Bacigalupo, Dionne Bucy, MD [facility], [place]

## 2022-02-13 NOTE — Progress Notes (Signed)
I,Sulibeya S Dimas,acting as a Education administrator for Lavon Paganini, MD.,have documented all relevant documentation on the behalf of Lavon Paganini, MD,as directed by  Lavon Paganini, MD while in the presence of Lavon Paganini, MD.     Established patient visit   Patient: Timothy Douglas   DOB: [birth year]   52 y.o. Male  MRN: [id number] Visit Date: 02/14/2022  Today's healthcare provider: Lavon Paganini, MD   Chief Complaint  Patient presents with   Diabetes   Hypertension   Subjective    HPI  Diabetes Mellitus Type II, Follow-up  Lab Results  Component Value Date   HGBA1C 8.5 (A) 02/14/2022   HGBA1C 9.1 (A) 11/13/2021   HGBA1C 8.7 (A) 08/10/2021   Wt Readings from Last 3 Encounters:  02/14/22 235 lb (106.6 kg)  11/13/21 244 lb (110.7 kg)  09/05/21 237 lb (107.5 kg)   Last seen for diabetes 3 months ago.  Management since then includes continue metformin and glipizide at current dose. Add Ozempic 0.25 mg weekly and increase to 0.5 mg weekly after first 2 weeks. (Was out of stock, so he only got it a few weeks ago) He reports excellent compliance with treatment. He is not having side effects.   Home blood sugar records:  not being checked  Episodes of hypoglycemia? No    Current insulin regiment: none Most Recent Eye Exam: UTD   Pertinent Labs: Lab Results  Component Value Date   CHOL 124 05/11/2021   HDL 40 05/11/2021   LDLCALC 68 05/11/2021   TRIG 82 05/11/2021   CHOLHDL 3.1 05/11/2021   Lab Results  Component Value Date   NA 137 11/13/2021   K 4.5 11/13/2021   CREATININE 1.51 (H) 11/13/2021   EGFR 55 (L) 11/13/2021   LABMICR <3.0 05/11/2021   MICRALBCREAT <2 05/11/2021     ---------------------------------------------------------------------------------------------------   Medications: Outpatient Medications Prior to Visit  Medication Sig   amLODipine (NORVASC) 10 MG tablet TAKE 1 TABLET BY MOUTH EVERY DAY   Ascorbic Acid (VITAMIN C PO)  Take by mouth daily.   atorvastatin (LIPITOR) 80 MG tablet Take 1 tablet (80 mg total) by mouth daily.   diclofenac (VOLTAREN) 75 MG EC tablet Take 1 tablet (75 mg total) by mouth 2 (two) times daily as needed.   glipiZIDE (GLUCOTROL) 10 MG tablet TAKE 1 TABLET (10 MG TOTAL) BY MOUTH TWICE A DAY BEFORE A MEAL   hydrochlorothiazide (HYDRODIURIL) 25 MG tablet Take 1 tablet (25 mg total) by mouth daily.   indomethacin (INDOCIN) 25 MG capsule TAKE 1 CAPSULE BY MOUTH THREE TIMES A DAY AS NEEDED   lidocaine (XYLOCAINE) 2 % solution Please specify directions, refills and quantity   lisinopril (ZESTRIL) 40 MG tablet Take 1 tablet (40 mg total) by mouth daily.   meclizine (ANTIVERT) 25 MG tablet Take 1 tablet (25 mg total) by mouth every 6 (six) hours as needed.   metFORMIN (GLUCOPHAGE-XR) 750 MG 24 hr tablet TAKE 2 TABLETS (1,500 MG TOTAL) BY MOUTH EVERY DAY WITH BREAKFAST   Multiple Vitamin (MULTIVITAMIN) tablet Take 1 tablet by mouth daily.   tadalafil (CIALIS) 20 MG tablet TAKE ONE TABLET BY MOUTH ONE TIME DAILY   [DISCONTINUED] OZEMPIC, 0.25 OR 0.5 MG/DOSE, 2 MG/3ML SOPN Inject into the skin.   No facility-administered medications prior to visit.    Review of Systems per HPI     Objective    BP 130/89 (BP Location: Left Arm, Patient Position: Sitting, Cuff Size: Large)   Pulse 89  Temp 97.8 F (36.6 C) (Temporal)   Resp 16   Wt 235 lb (106.6 kg)   BMI 29.37 kg/m    Physical Exam Vitals reviewed.  Constitutional:      General: He is not in acute distress.    Appearance: Normal appearance. He is not diaphoretic.  HENT:     Head: Normocephalic and atraumatic.  Eyes:     General: No scleral icterus.    Conjunctiva/sclera: Conjunctivae normal.  Cardiovascular:     Rate and Rhythm: Normal rate and regular rhythm.     Heart sounds: Normal heart sounds. No murmur heard. Pulmonary:     Effort: Pulmonary effort is normal. No respiratory distress.     Breath sounds: Normal breath  sounds. No wheezing or rhonchi.  Musculoskeletal:     Cervical back: Neck supple.     Right lower leg: No edema.     Left lower leg: No edema.  Lymphadenopathy:     Cervical: No cervical adenopathy.  Skin:    General: Skin is warm and dry.     Findings: No rash.  Neurological:     Mental Status: He is alert and oriented to person, place, and time. Mental status is at baseline.  Psychiatric:        Mood and Affect: Mood normal.        Behavior: Behavior normal.       Results for orders placed or performed in visit on 02/14/22  POCT glycosylated hemoglobin (Hb A1C)  Result Value Ref Range   Hemoglobin A1C 8.5 (A) 4.0 - 5.6 %   Est. average glucose Bld gHb Est-mCnc 197     Assessment & Plan     Problem List Items Addressed This Visit       Cardiovascular and Mediastinum   Hypertension associated with diabetes ([place])    Well controlled Continue current medications Reviewed metabolic panel      Relevant Medications   Semaglutide, 1 MG/DOSE, 4 MG/3ML SOPN     Endocrine   T2DM (type 2 diabetes mellitus) ([place]) - Primary    Improving, still with hyperglycemia Continue current medications, but increase Ozempic to 1 mg weekly UTD on vaccines, foot exam Advised need for eye exam On ACEi/ARB On Statin Discussed diet and exercise F/u in 3 months       Relevant Medications   Semaglutide, 1 MG/DOSE, 4 MG/3ML SOPN   Other Relevant Orders   POCT glycosylated hemoglobin (Hb A1C) (Completed)   Hyperlipidemia due to type 2 diabetes mellitus (HCC)    Previously well controlled Continue statin Repeat FLP and CMP at next visit  Goal LDL < 70      Relevant Medications   Semaglutide, 1 MG/DOSE, 4 MG/3ML SOPN   Other Visit Diagnoses     Colon cancer screening       Relevant Orders   Ambulatory referral to Gastroenterology        Return in about 3 months (around 05/16/2022) for CPE.      I, Lavon Paganini, MD, have reviewed all documentation for this visit. The  documentation on 02/14/22 for the exam, diagnosis, procedures, and orders are all accurate and complete.   Saffron Busey, Dionne Bucy, MD, MPH [facility]

## 2022-02-14 ENCOUNTER — Ambulatory Visit: Payer: No Typology Code available for payment source | Admitting: Family Medicine

## 2022-02-14 ENCOUNTER — Encounter: Payer: Self-pay | Admitting: Family Medicine

## 2022-02-14 VITALS — BP 130/89 | HR 89 | Temp 97.8°F | Resp 16 | Wt 235.0 lb

## 2022-02-14 DIAGNOSIS — Z1211 Encounter for screening for malignant neoplasm of colon: Secondary | ICD-10-CM | POA: Diagnosis not present

## 2022-02-14 DIAGNOSIS — E785 Hyperlipidemia, unspecified: Secondary | ICD-10-CM | POA: Diagnosis not present

## 2022-02-14 DIAGNOSIS — E1159 Type 2 diabetes mellitus with other circulatory complications: Secondary | ICD-10-CM | POA: Diagnosis not present

## 2022-02-14 DIAGNOSIS — I152 Hypertension secondary to endocrine disorders: Secondary | ICD-10-CM | POA: Diagnosis not present

## 2022-02-14 DIAGNOSIS — E1169 Type 2 diabetes mellitus with other specified complication: Secondary | ICD-10-CM

## 2022-02-14 LAB — POCT GLYCOSYLATED HEMOGLOBIN (HGB A1C)
Est. average glucose Bld gHb Est-mCnc: 197
Hemoglobin A1C: 8.5 % — AB (ref 4.0–5.6)

## 2022-02-14 MED ORDER — SEMAGLUTIDE (1 MG/DOSE) 4 MG/3ML ~~LOC~~ SOPN: 1.0000 mg | PEN_INJECTOR | SUBCUTANEOUS | 1 refills | Status: DC

## 2022-02-14 NOTE — Assessment & Plan Note (Signed)
Well controlled Continue current medications Reviewed metabolic panel

## 2022-02-14 NOTE — Assessment & Plan Note (Signed)
Improving, still with hyperglycemia Continue current medications, but increase Ozempic to 1 mg weekly UTD on vaccines, foot exam Advised need for eye exam On ACEi/ARB On Statin Discussed diet and exercise F/u in 3 months

## 2022-02-14 NOTE — Assessment & Plan Note (Signed)
Previously well controlled Continue statin Repeat FLP and CMP at next visit  Goal LDL < 70

## 2022-02-18 ENCOUNTER — Other Ambulatory Visit: Payer: Self-pay

## 2022-02-18 ENCOUNTER — Telehealth: Payer: Self-pay

## 2022-02-18 DIAGNOSIS — Z86010 Personal history of colonic polyps: Secondary | ICD-10-CM

## 2022-02-18 MED ORDER — NA SULFATE-K SULFATE-MG SULF 17.5-3.13-1.6 GM/177ML PO SOLN: 1.0000 | Freq: Once | ORAL | 0 refills | Status: AC

## 2022-02-18 NOTE — Telephone Encounter (Signed)
Gastroenterology Pre-Procedure Review  Request Date: 03/22/22 Requesting Physician: Dr. Allen Norris  PATIENT REVIEW QUESTIONS: The patient responded to the following health history questions as indicated:    1. Are you having any GI issues? no 2. Do you have a personal history of Polyps? no 3. Do you have a family history of Colon Cancer or Polyps? yes (father colon polyps) 4. Diabetes Mellitus? Yes patient has been asked to stop Metformin and Glipizide 3 days before procedure.  Stop Semaglutide 7 days before procedure. 5. Joint replacements in the past 12 months?no 6. Major health problems in the past 3 months?no 7. Any artificial heart valves, MVP, or defibrillator?no    MEDICATIONS & ALLERGIES:    Patient reports the following regarding taking any anticoagulation/antiplatelet therapy:   Plavix, Coumadin, Eliquis, Xarelto, Lovenox, Pradaxa, Brilinta, or Effient? no Aspirin? no  Patient confirms/reports the following medications:  Current Outpatient Medications  Medication Sig Dispense Refill   amLODipine (NORVASC) 10 MG tablet TAKE 1 TABLET BY MOUTH EVERY DAY 90 tablet 1   Ascorbic Acid (VITAMIN C PO) Take by mouth daily.     atorvastatin (LIPITOR) 80 MG tablet Take 1 tablet (80 mg total) by mouth daily. 90 tablet 1   diclofenac (VOLTAREN) 75 MG EC tablet Take 1 tablet (75 mg total) by mouth 2 (two) times daily as needed. 180 tablet 0   glipiZIDE (GLUCOTROL) 10 MG tablet TAKE 1 TABLET (10 MG TOTAL) BY MOUTH TWICE A DAY BEFORE A MEAL 180 tablet 1   hydrochlorothiazide (HYDRODIURIL) 25 MG tablet Take 1 tablet (25 mg total) by mouth daily. 90 tablet 1   indomethacin (INDOCIN) 25 MG capsule TAKE 1 CAPSULE BY MOUTH THREE TIMES A DAY AS NEEDED 90 capsule 0   lidocaine (XYLOCAINE) 2 % solution Please specify directions, refills and quantity 100 mL 0   lisinopril (ZESTRIL) 40 MG tablet Take 1 tablet (40 mg total) by mouth daily. 90 tablet 1   meclizine (ANTIVERT) 25 MG tablet Take 1 tablet (25 mg  total) by mouth every 6 (six) hours as needed. 90 tablet 0   metFORMIN (GLUCOPHAGE-XR) 750 MG 24 hr tablet TAKE 2 TABLETS (1,500 MG TOTAL) BY MOUTH EVERY DAY WITH BREAKFAST 180 tablet 1   Multiple Vitamin (MULTIVITAMIN) tablet Take 1 tablet by mouth daily.     Semaglutide, 1 MG/DOSE, 4 MG/3ML SOPN Inject 1 mg as directed once a week. 9 mL 1   tadalafil (CIALIS) 20 MG tablet TAKE ONE TABLET BY MOUTH ONE TIME DAILY 90 tablet 3   No current facility-administered medications for this visit.    Patient confirms/reports the following allergies:  Allergies  Allergen Reactions   Other Rash   Tape Rash    No orders of the defined types were placed in this encounter.   AUTHORIZATION INFORMATION Primary Insurance: 1D#: Group #:  Secondary Insurance: 1D#: Group #:  SCHEDULE INFORMATION: Date: 03/22/22 Time: Location: [place]

## 2022-03-10 ENCOUNTER — Other Ambulatory Visit: Payer: Self-pay | Admitting: Family Medicine

## 2022-03-19 ENCOUNTER — Encounter: Payer: Self-pay | Admitting: Gastroenterology

## 2022-03-21 ENCOUNTER — Other Ambulatory Visit
Admission: RE | Admit: 2022-03-21 | Discharge: 2022-03-21 | Disposition: A | Discharge status: Home / Self Care | Payer: No Typology Code available for payment source | Attending: Urgent Care | Admitting: Urgent Care

## 2022-03-21 DIAGNOSIS — Z01812 Encounter for preprocedural laboratory examination: Secondary | ICD-10-CM | POA: Insufficient documentation

## 2022-03-21 DIAGNOSIS — Z79899 Other long term (current) drug therapy: Secondary | ICD-10-CM | POA: Diagnosis not present

## 2022-03-21 LAB — POTASSIUM: Potassium: 4.8 mmol/L (ref 3.5–5.1)

## 2022-03-22 ENCOUNTER — Encounter: Payer: Self-pay | Admitting: Gastroenterology

## 2022-03-22 ENCOUNTER — Ambulatory Visit
Admission: RE | Admit: 2022-03-22 | Discharge: 2022-03-22 | Disposition: A | Discharge status: Home / Self Care | Payer: No Typology Code available for payment source | Attending: Gastroenterology | Admitting: Gastroenterology

## 2022-03-22 ENCOUNTER — Encounter
Admission: RE | Disposition: A | Discharge status: Home / Self Care | Payer: Self-pay | Source: Home / Self Care | Attending: Gastroenterology

## 2022-03-22 ENCOUNTER — Ambulatory Visit: Payer: No Typology Code available for payment source | Admitting: Anesthesiology

## 2022-03-22 ENCOUNTER — Other Ambulatory Visit: Payer: Self-pay

## 2022-03-22 DIAGNOSIS — Z01812 Encounter for preprocedural laboratory examination: Secondary | ICD-10-CM

## 2022-03-22 DIAGNOSIS — Z9049 Acquired absence of other specified parts of digestive tract: Secondary | ICD-10-CM | POA: Insufficient documentation

## 2022-03-22 DIAGNOSIS — Z7984 Long term (current) use of oral hypoglycemic drugs: Secondary | ICD-10-CM | POA: Insufficient documentation

## 2022-03-22 DIAGNOSIS — K640 First degree hemorrhoids: Secondary | ICD-10-CM | POA: Insufficient documentation

## 2022-03-22 DIAGNOSIS — E785 Hyperlipidemia, unspecified: Secondary | ICD-10-CM | POA: Insufficient documentation

## 2022-03-22 DIAGNOSIS — Z09 Encounter for follow-up examination after completed treatment for conditions other than malignant neoplasm: Secondary | ICD-10-CM | POA: Diagnosis present

## 2022-03-22 DIAGNOSIS — K635 Polyp of colon: Secondary | ICD-10-CM | POA: Insufficient documentation

## 2022-03-22 DIAGNOSIS — E119 Type 2 diabetes mellitus without complications: Secondary | ICD-10-CM | POA: Insufficient documentation

## 2022-03-22 DIAGNOSIS — I10 Essential (primary) hypertension: Secondary | ICD-10-CM | POA: Diagnosis not present

## 2022-03-22 DIAGNOSIS — Z79899 Other long term (current) drug therapy: Secondary | ICD-10-CM

## 2022-03-22 DIAGNOSIS — Z86010 Personal history of colonic polyps: Secondary | ICD-10-CM | POA: Diagnosis not present

## 2022-03-22 DIAGNOSIS — D125 Benign neoplasm of sigmoid colon: Secondary | ICD-10-CM | POA: Diagnosis not present

## 2022-03-22 DIAGNOSIS — Z7985 Long-term (current) use of injectable non-insulin antidiabetic drugs: Secondary | ICD-10-CM | POA: Diagnosis not present

## 2022-03-22 HISTORY — PX: COLONOSCOPY WITH PROPOFOL: SHX5780

## 2022-03-22 HISTORY — PX: POLYPECTOMY: SHX5525

## 2022-03-22 LAB — GLUCOSE, CAPILLARY: Glucose-Capillary: 143 mg/dL — ABNORMAL HIGH (ref 70–99)

## 2022-03-22 SURGERY — COLONOSCOPY WITH PROPOFOL
Anesthesia: General | Site: Rectum

## 2022-03-22 MED ORDER — PROPOFOL 10 MG/ML IV BOLUS
INTRAVENOUS | Status: DC | PRN
  Administered 2022-03-22: 40 mg via INTRAVENOUS
  Administered 2022-03-22: 50 mg via INTRAVENOUS
  Administered 2022-03-22: 40 mg via INTRAVENOUS
  Administered 2022-03-22: 100 mg via INTRAVENOUS
  Administered 2022-03-22: 40 mg via INTRAVENOUS
  Administered 2022-03-22 (×2): 50 mg via INTRAVENOUS
  Administered 2022-03-22: 60 mg via INTRAVENOUS
  Administered 2022-03-22: 50 mg via INTRAVENOUS

## 2022-03-22 MED ORDER — SODIUM CHLORIDE 0.9 % IV SOLN: INTRAVENOUS | Status: DC

## 2022-03-22 MED ORDER — LIDOCAINE HCL (CARDIAC) PF 100 MG/5ML IV SOSY
PREFILLED_SYRINGE | INTRAVENOUS | Status: DC | PRN
  Administered 2022-03-22: 100 mg via INTRAVENOUS

## 2022-03-22 MED ORDER — LACTATED RINGERS IV SOLN: INTRAVENOUS | Status: DC

## 2022-03-22 MED ORDER — STERILE WATER FOR IRRIGATION IR SOLN
Status: DC | PRN
  Administered 2022-03-22: 150 mL

## 2022-03-22 SURGICAL SUPPLY — 21 items

## 2022-03-22 NOTE — Anesthesia Preprocedure Evaluation (Addendum)
Anesthesia Evaluation  Patient identified by MRN, date of birth, ID band Patient awake    Reviewed: Allergy & Precautions, NPO status , Patient's Chart, lab work & pertinent test results  History of Anesthesia Complications Negative for: history of anesthetic complications  Airway Mallampati: II  TM Distance: >3 FB Neck ROM: Full    Dental  (+) Dental Advidsory Given   Pulmonary neg pulmonary ROS, neg shortness of breath, neg COPD   Pulmonary exam normal breath sounds clear to auscultation       Cardiovascular hypertension, (-) angina (-) Past MI, (-) CABG and (-) DOE negative cardio ROS Normal cardiovascular exam Rhythm:Regular Rate:Normal   HLD   Neuro/Psych  Neuromuscular disease (Lumbar spondylolithesis) negative neurological ROS  negative psych ROS   GI/Hepatic negative GI ROS, Neg liver ROS,neg GERD  ,,  Endo/Other  negative endocrine ROSdiabetes, Type 2    Renal/GU negative Renal ROS  negative genitourinary   Musculoskeletal   Abdominal  (+) + obese (BMI 30)  Peds  Hematology negative hematology ROS (+)   Anesthesia Other Findings Past Medical History: No date: DJD (degenerative joint disease) No date: Essential hypertension No date: Hyperlipidemia due to type 2 diabetes mellitus (HCC) No date: Shoulder pain No date: T2DM (type 2 diabetes mellitus) (HCC) No date: Vertigo     Comment:  1 episode over 10 yrs ago  Past Surgical History: No date: APPENDECTOMY 03/19/2019: COLONOSCOPY WITH PROPOFOL; N/A     Comment:  Procedure: COLONOSCOPY WITH BIOPSY;  Surgeon: Lucilla Lame, MD;  Location: [place];  Service:               Endoscopy;  Laterality: N/A;  Diabetic - oral               meds Priority 4 No date: EYE SURGERY 1990s: KELOID EXCISION No date: KNEE ARTHROSCOPY; Left No date: LUMBAR DISC SURGERY No date: LUMBAR FUSION     Comment:  L5-S1 03/19/2019: POLYPECTOMY; N/A      Comment:  Procedure: POLYPECTOMY;  Surgeon: Lucilla Lame, MD;                Location: [place];  Service: Endoscopy;                Laterality: N/A; No date: SPINAL CORD STIMULATOR IMPLANT  BMI    Body Mass Index: 28.36 kg/m      Reproductive/Obstetrics negative OB ROS                             Anesthesia Physical Anesthesia Plan  ASA: 2  Anesthesia Plan: General   Post-op Pain Management: Minimal or no pain anticipated   Induction: Intravenous  PONV Risk Score and Plan: 2 and Propofol infusion, TIVA and Treatment may vary due to age or medical condition  Airway Management Planned: Natural Airway and Nasal Cannula  Additional Equipment: None  Intra-op Plan:   Post-operative Plan:   Informed Consent: I have reviewed the patients History and Physical, chart, labs and discussed the procedure including the risks, benefits and alternatives for the proposed anesthesia with the patient or authorized representative who has indicated his/her understanding and acceptance.     Dental advisory given  Plan Discussed with: CRNA and Anesthesiologist  Anesthesia Plan Comments: (Discussed risks of anesthesia with patient, including possibility of difficulty with spontaneous ventilation under anesthesia necessitating  airway intervention, PONV, and rare risks such as cardiac or respiratory or neurological events, and allergic reactions. Discussed the role of CRNA in patient's perioperative care. Patient understands.)        Anesthesia Quick Evaluation

## 2022-03-22 NOTE — Anesthesia Postprocedure Evaluation (Signed)
Anesthesia Post Note  Patient: Timothy Douglas  Procedure(s) Performed: COLONOSCOPY WITH PROPOFOL (Rectum) POLYPECTOMY (Rectum)  Patient location during evaluation: PACU Anesthesia Type: General Level of consciousness: awake and alert Pain management: pain level controlled Vital Signs Assessment: post-procedure vital signs reviewed and stable Respiratory status: spontaneous breathing, nonlabored ventilation, respiratory function stable and patient connected to nasal cannula oxygen Cardiovascular status: blood pressure returned to baseline and stable Postop Assessment: no apparent nausea or vomiting Anesthetic complications: no  No notable events documented.   Last Vitals:  Vitals:   03/22/22 0822 03/22/22 0837  BP: 107/79 113/86  Pulse: 95 (!) 101  Resp: 18 (!) 24  Temp: 36.5 C (!) 36.4 C  SpO2: 96% 95%    Last Pain:  Vitals:   03/22/22 0837  TempSrc:   PainSc: 0-No pain                 Dimas Millin

## 2022-03-22 NOTE — H&P (Signed)
Lucilla Lame, MD [address] [place], [place] [address] [phone] Fax : [phone]  Primary Care Physician:  Virginia Crews, MD Primary Gastroenterologist:  Dr. Allen Norris  Pre-Procedure History & Physical: HPI:  Timothy Douglas is a 52 y.o. male is here for an colonoscopy.   Past Medical History:  Diagnosis Date   DJD (degenerative joint disease)    Essential hypertension    Hyperlipidemia due to type 2 diabetes mellitus (HCC)    Shoulder pain    T2DM (type 2 diabetes mellitus) (HCC)    Vertigo    1 episode over 10 yrs ago    Past Surgical History:  Procedure Laterality Date   APPENDECTOMY     COLONOSCOPY WITH PROPOFOL N/A 03/19/2019   Procedure: COLONOSCOPY WITH BIOPSY;  Surgeon: Lucilla Lame, MD;  Location: [place];  Service: Endoscopy;  Laterality: N/A;  Diabetic - oral meds Priority 4   EYE SURGERY     KELOID EXCISION  1990s   KNEE ARTHROSCOPY Left    LUMBAR DISC SURGERY     LUMBAR FUSION     L5-S1   POLYPECTOMY N/A 03/19/2019   Procedure: POLYPECTOMY;  Surgeon: Lucilla Lame, MD;  Location: [place];  Service: Endoscopy;  Laterality: N/A;   SPINAL CORD STIMULATOR IMPLANT      Prior to Admission medications   Medication Sig Start Date End Date Taking? Authorizing Provider  amLODipine (NORVASC) 10 MG tablet TAKE 1 TABLET BY MOUTH EVERY DAY 11/05/21  Yes Bacigalupo, Dionne Bucy, MD  Ascorbic Acid (VITAMIN C PO) Take by mouth daily.   Yes [provider]  atorvastatin (LIPITOR) 80 MG tablet Take 1 tablet (80 mg total) by mouth daily. 08/10/21  Yes [name], FNP  diclofenac (VOLTAREN) 75 MG EC tablet Take 1 tablet (75 mg total) by mouth 2 (two) times daily as needed. 08/10/21  Yes Tally Joe T, FNP  glipiZIDE (GLUCOTROL) 10 MG tablet TAKE 1 TABLET (10 MG TOTAL) BY MOUTH TWICE A DAY BEFORE A MEAL 01/02/22  Yes Bacigalupo, Dionne Bucy, MD  hydrochlorothiazide (HYDRODIURIL) 25 MG tablet Take 1 tablet (25 mg total) by  mouth daily. 08/10/21  Yes Tally Joe T, FNP  indomethacin (INDOCIN) 25 MG capsule TAKE 1 CAPSULE BY MOUTH THREE TIMES A DAY AS NEEDED 03/11/22  Yes Bacigalupo, Dionne Bucy, MD  lidocaine (XYLOCAINE) 2 % solution Please specify directions, refills and quantity 03/13/20  Yes Bacigalupo, Dionne Bucy, MD  lisinopril (ZESTRIL) 40 MG tablet Take 1 tablet (40 mg total) by mouth daily. 08/10/21  Yes [name], FNP  meclizine (ANTIVERT) 25 MG tablet Take 1 tablet (25 mg total) by mouth every 6 (six) hours as needed. 08/10/21  Yes Tally Joe T, FNP  metFORMIN (GLUCOPHAGE-XR) 750 MG 24 hr tablet TAKE 2 TABLETS (1,500 MG TOTAL) BY MOUTH EVERY DAY WITH BREAKFAST 02/04/22  Yes Bacigalupo, Dionne Bucy, MD  Multiple Vitamin (MULTIVITAMIN) tablet Take 1 tablet by mouth daily.   Yes [provider]  Semaglutide, 1 MG/DOSE, 4 MG/3ML SOPN Inject 1 mg as directed once a week. 02/14/22  Yes Bacigalupo, Dionne Bucy, MD  tadalafil (CIALIS) 20 MG tablet TAKE ONE TABLET BY MOUTH ONE TIME DAILY 12/31/21  Yes Virginia Crews, MD    Allergies as of 02/18/2022 - Review Complete 02/14/2022  Allergen Reaction Noted   Other Rash 02/05/2019   Tape Rash 02/05/2019    Family History  Problem Relation Age of Onset   Hypertension Mother  Bell's palsy Mother    Hypertension Father    Thyroid disease Sister    Thyroid disease Brother    Diabetes Maternal Grandmother    Leukemia Maternal Grandfather    Diabetes Paternal Grandmother    Leukemia Maternal Uncle    Congestive Heart Failure Paternal Great-grandfather    Breast cancer Neg Hx    Prostate cancer Neg Hx    Colon cancer Neg Hx     Social History   Socioeconomic History   Marital status: Married    Spouse name: Kevin Winquist   Number of children: 3   Years of education: Not on file   Highest education level: Not on file  Occupational History   Not on file  Tobacco Use   Smoking status: Never   Smokeless tobacco: Never  Vaping Use   Vaping Use: Never  used  Substance and Sexual Activity   Alcohol use: Yes    Alcohol/week: 2.0 - 3.0 standard drinks of alcohol    Types: 1 - 2 Glasses of wine, 1 Cans of beer per week   Drug use: Never   Sexual activity: Yes    Partners: Female    Birth control/protection: None  Other Topics Concern   Not on file  Social History Narrative   Not on file   Social Determinants of Health   Financial Resource Strain: Not on file  Food Insecurity: Not on file  Transportation Needs: Not on file  Physical Activity: Not on file  Stress: Not on file  Social Connections: Not on file  Intimate Partner Violence: Not on file    Review of Systems: See HPI, otherwise negative ROS  Physical Exam: BP 133/85   Pulse 87   Temp 97.9 F (36.6 C) (Temporal)   Resp 14   Ht '6\' 3"'$  (1.905 m)   Wt 102.9 kg   SpO2 98%   BMI 28.36 kg/m  General:   Alert,  pleasant and cooperative in NAD Head:  Normocephalic and atraumatic. Neck:  Supple; no masses or thyromegaly. Lungs:  Clear throughout to auscultation.    Heart:  Regular rate and rhythm. Abdomen:  Soft, nontender and nondistended. Normal bowel sounds, without guarding, and without rebound.   Neurologic:  Alert and  oriented x4;  grossly normal neurologically.  Impression/Plan: Timothy Douglas is here for an colonoscopy to be performed for a history of adenomatous polyps on 2021   Risks, benefits, limitations, and alternatives regarding  colonoscopy have been reviewed with the patient.  Questions have been answered.  All parties agreeable.   Lucilla Lame, MD  03/22/2022, 7:47 AM

## 2022-03-22 NOTE — Transfer of Care (Signed)
Immediate Anesthesia Transfer of Care Note  Patient: Timothy Douglas  Procedure(s) Performed: COLONOSCOPY WITH PROPOFOL (Rectum) POLYPECTOMY (Rectum)  Patient Location: PACU  Anesthesia Type: General  Level of Consciousness: awake, alert  and patient cooperative  Airway and Oxygen Therapy: Patient Spontanous Breathing and Patient connected to supplemental oxygen  Post-op Assessment: Post-op Vital signs reviewed, Patient's Cardiovascular Status Stable, Respiratory Function Stable, Patent Airway and No signs of Nausea or vomiting  Post-op Vital Signs: Reviewed and stable  Complications: No notable events documented.

## 2022-03-22 NOTE — Op Note (Signed)
[facility] Gastroenterology Patient Name: Timothy Douglas Procedure Date: 03/22/2022 7:26 AM MRN: [id number] Account #: [id number] Date of Birth: 1970/04/19 Admit Type: Outpatient Age: 52 Room: [facility] OR ROOM 01 Gender: Male Note Status: Finalized Instrument Name: [id number] Procedure:             Colonoscopy Indications:           High risk colon cancer surveillance: Personal history                         of colonic polyps Providers:             Lucilla Lame MD, MD Referring MD:          [name] (Referring MD) Medicines:             Propofol per Anesthesia Complications:         No immediate complications. Procedure:             Pre-Anesthesia Assessment:                        - Prior to the procedure, a History and Physical was                         performed, and patient medications and allergies were                         reviewed. The patient's tolerance of previous                         anesthesia was also reviewed. The risks and benefits                         of the procedure and the sedation options and risks                         were discussed with the patient. All questions were                         answered, and informed consent was obtained. Prior                         Anticoagulants: The patient has taken no anticoagulant                         or antiplatelet agents. ASA Grade Assessment: II - A                         patient with mild systemic disease. After reviewing                         the risks and benefits, the patient was deemed in                         satisfactory condition to undergo the procedure.                        After obtaining informed consent, the colonoscope was  passed under direct vision. Throughout the procedure,                         the patient's blood pressure, pulse, and oxygen                         saturations were monitored continuously. The                          Colonoscope was introduced through the anus and                         advanced to the the cecum, identified by appendiceal                         orifice and ileocecal valve. The colonoscopy was                         performed without difficulty. The patient tolerated                         the procedure well. The quality of the bowel                         preparation was fair. Findings:      The perianal and digital rectal examinations were normal.      A 3 mm polyp was found in the sigmoid colon. The polyp was sessile. The       polyp was removed with a cold snare. Resection and retrieval were       complete.      Non-bleeding internal hemorrhoids were found during retroflexion. The       hemorrhoids were Grade I (internal hemorrhoids that do not prolapse). Impression:            - Preparation of the colon was fair.                        - One 3 mm polyp in the sigmoid colon, removed with a                         cold snare. Resected and retrieved.                        - Non-bleeding internal hemorrhoids. Recommendation:        - Discharge patient to home.                        - Resume previous diet.                        - Continue present medications.                        - Await pathology results.                        - Repeat colonoscopy in 3 years for surveillance. Procedure Code(s):     --- Professional ---  45385, Colonoscopy, flexible; with removal of                         tumor(s), polyp(s), or other lesion(s) by snare                         technique Diagnosis Code(s):     --- Professional ---                        Z86.010, Personal history of colonic polyps                        D12.5, Benign neoplasm of sigmoid colon CPT copyright 2022 American Medical Association. All rights reserved. The codes documented in this report are preliminary and upon coder review may  be revised to meet current compliance  requirements. Lucilla Lame MD, MD 03/22/2022 8:21:41 AM This report has been signed electronically. Number of Addenda: 0 Note Initiated On: 03/22/2022 7:26 AM Scope Withdrawal Time: 0 hours 10 minutes 3 seconds  Total Procedure Duration: 0 hours 19 minutes 34 seconds  Estimated Blood Loss:  Estimated blood loss: none.      [facility]

## 2022-03-25 ENCOUNTER — Encounter: Payer: Self-pay | Admitting: Gastroenterology

## 2022-03-26 LAB — SURGICAL PATHOLOGY

## 2022-03-30 ENCOUNTER — Encounter: Payer: Self-pay | Admitting: Gastroenterology

## 2022-04-22 ENCOUNTER — Other Ambulatory Visit: Payer: Self-pay | Admitting: Family Medicine

## 2022-04-23 NOTE — Telephone Encounter (Signed)
Requested Prescriptions  Pending Prescriptions Disp Refills   indomethacin (INDOCIN) 25 MG capsule [Pharmacy Med Name: INDOMETHACIN 25 MG CAPSULE] 90 capsule 0    Sig: TAKE 1 CAPSULE BY MOUTH THREE TIMES A DAY AS NEEDED     Analgesics:  NSAIDS Failed - 04/22/2022  2:28 AM      Failed - Manual Review: Labs are only required if the patient has taken medication for more than 8 weeks.      Failed - Cr in normal range and within 360 days    Creatinine, Ser  Date Value Ref Range Status  11/13/2021 1.51 (H) 0.76 - 1.27 mg/dL Final         Passed - HGB in normal range and within 360 days    Hemoglobin  Date Value Ref Range Status  05/11/2021 15.4 13.0 - 17.7 g/dL Final         Passed - PLT in normal range and within 360 days    Platelets  Date Value Ref Range Status  05/11/2021 225 150 - 450 x10E3/uL Final         Passed - HCT in normal range and within 360 days    Hematocrit  Date Value Ref Range Status  05/11/2021 44.6 37.5 - 51.0 % Final         Passed - eGFR is 30 or above and within 360 days    GFR calc Af Amer  Date Value Ref Range Status  03/02/2020 38 (L) >59 mL/min/1.73 Final    Comment:    **In accordance with recommendations from the NKF-ASN Task force,**   [facility] is in the process of updating its eGFR calculation to the   2021 CKD-EPI creatinine equation that estimates kidney function   without a race variable.    GFR, Estimated  Date Value Ref Range Status  03/09/2020 42 (L) >60 mL/min Final    Comment:    (NOTE) Calculated using the CKD-EPI Creatinine Equation (2021)    eGFR  Date Value Ref Range Status  11/13/2021 55 (L) >59 mL/min/1.73 Final         Passed - Patient is not pregnant      Passed - Valid encounter within last 12 months    Recent Outpatient Visits           2 months ago Type 2 diabetes mellitus with other specified complication, without long-term current use of insulin ([place])   [place],  Dionne Bucy, MD   5 months ago Hypertension associated with diabetes [facility])   [place], Dionne Bucy, MD   8 months ago Type 2 diabetes mellitus with other specified complication, without long-term current use of insulin [facility])   [place] Tally Joe T, FNP   11 months ago Osteoarthritis of first metatarsophalangeal (MTP) joint of left foot   [place] [facility], Earley Abide, MD   11 months ago Type 2 diabetes mellitus with other specified complication, without long-term current use of insulin [facility])   [place], Dionne Bucy, MD       Future Appointments             In 3 weeks Bacigalupo, Dionne Bucy, MD [facility], [facility]

## 2022-05-08 ENCOUNTER — Other Ambulatory Visit: Payer: Self-pay | Admitting: Family Medicine

## 2022-05-08 DIAGNOSIS — N1831 Chronic kidney disease, stage 3a: Secondary | ICD-10-CM

## 2022-05-08 DIAGNOSIS — R42 Dizziness and giddiness: Secondary | ICD-10-CM

## 2022-05-08 DIAGNOSIS — E1159 Type 2 diabetes mellitus with other circulatory complications: Secondary | ICD-10-CM

## 2022-05-08 DIAGNOSIS — I10 Essential (primary) hypertension: Secondary | ICD-10-CM

## 2022-05-08 DIAGNOSIS — E1169 Type 2 diabetes mellitus with other specified complication: Secondary | ICD-10-CM

## 2022-05-10 ENCOUNTER — Other Ambulatory Visit: Payer: Self-pay | Admitting: Family Medicine

## 2022-05-10 MED ORDER — INDOMETHACIN 25 MG PO CAPS: ORAL_CAPSULE | ORAL | 0 refills | Status: DC

## 2022-05-10 NOTE — Telephone Encounter (Signed)
Requested Prescriptions  Pending Prescriptions Disp Refills   indomethacin (INDOCIN) 25 MG capsule 90 capsule 0    Sig: TAKE 1 CAPSULE BY MOUTH THREE TIMES A DAY AS NEEDED     Analgesics:  NSAIDS Failed - 05/10/2022  1:31 PM      Failed - Manual Review: Labs are only required if the patient has taken medication for more than 8 weeks.      Failed - Cr in normal range and within 360 days    Creatinine, Ser  Date Value Ref Range Status  11/13/2021 1.51 (H) 0.76 - 1.27 mg/dL Final         Failed - HGB in normal range and within 360 days    Hemoglobin  Date Value Ref Range Status  05/11/2021 15.4 13.0 - 17.7 g/dL Final         Failed - PLT in normal range and within 360 days    Platelets  Date Value Ref Range Status  05/11/2021 225 150 - 450 x10E3/uL Final         Failed - HCT in normal range and within 360 days    Hematocrit  Date Value Ref Range Status  05/11/2021 44.6 37.5 - 51.0 % Final         Passed - eGFR is 30 or above and within 360 days    GFR calc Af Amer  Date Value Ref Range Status  03/02/2020 38 (L) >59 mL/min/1.73 Final    Comment:    **In accordance with recommendations from the NKF-ASN Task force,**   [facility] is in the process of updating its eGFR calculation to the   2021 CKD-EPI creatinine equation that estimates kidney function   without a race variable.    GFR, Estimated  Date Value Ref Range Status  03/09/2020 42 (L) >60 mL/min Final    Comment:    (NOTE) Calculated using the CKD-EPI Creatinine Equation (2021)    eGFR  Date Value Ref Range Status  11/13/2021 55 (L) >59 mL/min/1.73 Final         Passed - Patient is not pregnant      Passed - Valid encounter within last 12 months    Recent Outpatient Visits           2 months ago Type 2 diabetes mellitus with other specified complication, without long-term current use of insulin ([facility])   [facility] [place], Marzella Schlein, MD   5 months ago Hypertension associated  with diabetes [facility])   [place] [facility] [place], Marzella Schlein, MD   9 months ago Type 2 diabetes mellitus with other specified complication, without long-term current use of insulin [facility])   [place] [facility] Merita Norton T, FNP   11 months ago Osteoarthritis of first metatarsophalangeal (MTP) joint of left foot   [place] [facility] [place], Ocie Bob, MD   12 months ago Type 2 diabetes mellitus with other specified complication, without long-term current use of insulin [facility])    [facility] [place], Marzella Schlein, MD       Future Appointments             In 1 week Bacigalupo, Marzella Schlein, MD [facility], [facility]

## 2022-05-10 NOTE — Telephone Encounter (Signed)
Copied from CRM [id number]. Topic: General - Other >> May 10, 2022 12:06 PM Everette C wrote: Reason for CRM: Medication Refill - Medication: indomethacin (INDOCIN) 25 MG capsule [[id number]]  Has the patient contacted their pharmacy? Yes.   (Agent: If no, request that the patient contact the pharmacy for the refill. If patient does not wish to contact the pharmacy document the reason why and proceed with request.) (Agent: If yes, when and what did the pharmacy advise?)  Preferred Pharmacy (with phone number or street name): [facility] [phone] Hassell Halim [address] [place] [address] Phone: [phone] Fax: [phone] Hours: Not open 24 hours   Has the patient been seen for an appointment in the last year OR does the patient have an upcoming appointment? Yes.    Agent: Please be advised that RX refills may take up to 3 business days. We ask that you follow-up with your pharmacy.

## 2022-05-16 NOTE — Progress Notes (Signed)
I,Sulibeya S Dimas,acting as a Neurosurgeon for Shirlee Latch, MD.,have documented all relevant documentation on the behalf of Shirlee Latch, MD,as directed by  Shirlee Latch, MD while in the presence of Shirlee Latch, MD.    Complete physical exam   Patient: Timothy Douglas   DOB: 12/11/1970   52 y.o. Male  MRN: [id number] Visit Date: 05/17/2022  Today's healthcare provider: Shirlee Latch, MD    Chief Complaint  Patient presents with   Annual Exam   Subjective    Pio Eatherly is a 52 y.o. male who presents today for a complete physical exam.  He reports consuming a general diet. Home exercise routine includes walking. He generally feels well. He reports sleeping well. He does have additional problems to discuss today.  HPI   Patient reports body/muscle pain after a long day of work. Patient reports using Lidocaine spray. Patient requesting muscle relaxer medication.    -Eye Exam: appt 06/14/22 at eye doctor next to [facility]   Past Medical History:  Diagnosis Date   DJD (degenerative joint disease)    Essential hypertension    Hyperlipidemia due to type 2 diabetes mellitus    Shoulder pain    T2DM (type 2 diabetes mellitus)    Vertigo    1 episode over 10 yrs ago   Past Surgical History:  Procedure Laterality Date   APPENDECTOMY     COLONOSCOPY WITH PROPOFOL N/A 03/19/2019   Procedure: COLONOSCOPY WITH BIOPSY;  Surgeon: Midge Minium, MD;  Location: [facility];  Service: Endoscopy;  Laterality: N/A;  Diabetic - oral meds Priority 4   COLONOSCOPY WITH PROPOFOL N/A 03/22/2022   Procedure: COLONOSCOPY WITH PROPOFOL;  Surgeon: Midge Minium, MD;  Location: [facility];  Service: Endoscopy;  Laterality: N/A;  Diabetic   EYE SURGERY     KELOID EXCISION  1990s   KNEE ARTHROSCOPY Left    LUMBAR DISC SURGERY     LUMBAR FUSION     L5-S1   POLYPECTOMY N/A 03/19/2019   Procedure: POLYPECTOMY;  Surgeon: Midge Minium, MD;  Location: [facility];  Service: Endoscopy;  Laterality: N/A;   POLYPECTOMY  03/22/2022   Procedure: POLYPECTOMY;  Surgeon: Midge Minium, MD;  Location: [facility];  Service: Endoscopy;;   SPINAL CORD STIMULATOR IMPLANT     Social History   Socioeconomic History   Marital status: Married    Spouse name: Verle Wheeling   Number of children: 3   Years of education: Not on file   Highest education level: Not on file  Occupational History   Not on file  Tobacco Use   Smoking status: Never   Smokeless tobacco: Never  Vaping Use   Vaping Use: Never used  Substance and Sexual Activity   Alcohol use: Yes    Alcohol/week: 2.0 - 3.0 standard drinks of alcohol    Types: 1 - 2 Glasses of wine, 1 Cans of beer per week   Drug use: Never   Sexual activity: Yes    Partners: Female    Birth control/protection: None  Other Topics Concern   Not on file  Social History Narrative   Not on file   Social Determinants of Health   Financial Resource Strain: Not on file  Food Insecurity: Not on file  Transportation Needs: Not on file  Physical Activity: Not on file  Stress: Not on file  Social Connections: Not on file  Intimate Partner Violence: Not on file   Family Status  Relation Name Status  Mother  (Not Specified)   Father  (Not Specified)   Sister  (Not Specified)   Brother  (Not Specified)   MGM  (Not Specified)   MGF  (Not Specified)   PGM  (Not Specified)   Mat Uncle  (Not Specified)   Paternal GGF  (Not Specified)   Neg Hx  (Not Specified)   Family History  Problem Relation Age of Onset   Hypertension Mother    Bell's palsy Mother    Hypertension Father    Thyroid disease Sister    Thyroid disease Brother    Diabetes Maternal Grandmother    Leukemia Maternal Grandfather    Diabetes Paternal Grandmother    Leukemia Maternal Uncle    Congestive Heart Failure Paternal Great-grandfather    Breast cancer Neg Hx    Prostate cancer Neg Hx    Colon cancer Neg Hx     Allergies  Allergen Reactions   Other Rash   Tape Rash    Patient Care Team: Erasmo Downer, MD as PCP - General (Family Medicine)   Medications: Outpatient Medications Prior to Visit  Medication Sig   amLODipine (NORVASC) 10 MG tablet TAKE 1 TABLET BY MOUTH EVERY DAY   Ascorbic Acid (VITAMIN C PO) Take by mouth daily.   atorvastatin (LIPITOR) 80 MG tablet TAKE 1 TABLET BY MOUTH EVERY DAY   diclofenac (VOLTAREN) 75 MG EC tablet Take 1 tablet (75 mg total) by mouth 2 (two) times daily as needed.   glipiZIDE (GLUCOTROL) 10 MG tablet TAKE 1 TABLET (10 MG TOTAL) BY MOUTH TWICE A DAY BEFORE A MEAL   hydrochlorothiazide (HYDRODIURIL) 25 MG tablet Take 1 tablet (25 mg total) by mouth daily.   indomethacin (INDOCIN) 25 MG capsule TAKE 1 CAPSULE BY MOUTH THREE TIMES A DAY AS NEEDED   lidocaine (XYLOCAINE) 2 % solution Please specify directions, refills and quantity   lisinopril (ZESTRIL) 40 MG tablet TAKE 1 TABLET BY MOUTH EVERY DAY   meclizine (ANTIVERT) 25 MG tablet TAKE 1 TABLET BY MOUTH EVERY 6 HOURS AS NEEDED.   metFORMIN (GLUCOPHAGE-XR) 750 MG 24 hr tablet TAKE 2 TABLETS (1,500 MG TOTAL) BY MOUTH EVERY DAY WITH BREAKFAST   Multiple Vitamin (MULTIVITAMIN) tablet Take 1 tablet by mouth daily.   Semaglutide, 1 MG/DOSE, 4 MG/3ML SOPN Inject 1 mg as directed once a week.   tadalafil (CIALIS) 20 MG tablet TAKE ONE TABLET BY MOUTH ONE TIME DAILY   No facility-administered medications prior to visit.    Review of Systems  Musculoskeletal:  Positive for back pain and myalgias.  All other systems reviewed and are negative.   Last CBC Lab Results  Component Value Date   WBC 10.5 05/11/2021   HGB 15.4 05/11/2021   HCT 44.6 05/11/2021   MCV 83 05/11/2021   MCH 28.6 05/11/2021   RDW 15.8 (H) 05/11/2021   PLT 225 05/11/2021   Last metabolic panel Lab Results  Component Value Date   GLUCOSE 159 (H) 11/13/2021   NA 137 11/13/2021   K 4.8 03/21/2022   CL 99 11/13/2021   CO2  24 11/13/2021   BUN 23 11/13/2021   CREATININE 1.51 (H) 11/13/2021   EGFR 55 (L) 11/13/2021   CALCIUM 10.4 (H) 11/13/2021   PROT 7.2 05/11/2021   ALBUMIN 4.5 05/11/2021   LABGLOB 2.7 05/11/2021   AGRATIO 1.7 05/11/2021   BILITOT 0.6 05/11/2021   ALKPHOS 87 05/11/2021   AST 24 05/11/2021   ALT 20 05/11/2021   ANIONGAP 14  03/09/2020   Last lipids Lab Results  Component Value Date   CHOL 124 05/11/2021   HDL 40 05/11/2021   LDLCALC 68 05/11/2021   TRIG 82 05/11/2021   CHOLHDL 3.1 05/11/2021   Last hemoglobin A1c Lab Results  Component Value Date   HGBA1C 8.5 (A) 02/14/2022   Last thyroid functions Lab Results  Component Value Date   TSH 1.020 03/02/2020      Objective    BP 115/78 (BP Location: Left Arm, Patient Position: Sitting, Cuff Size: Large)   Pulse 96   Temp 98.1 F (36.7 C) (Temporal)   Resp 12   Ht 6\' 3"  (1.905 m)   Wt 226 lb 14.4 oz (102.9 kg)   BMI 28.36 kg/m  BP Readings from Last 3 Encounters:  05/17/22 115/78  03/22/22 113/86  02/14/22 130/89   Wt Readings from Last 3 Encounters:  05/17/22 226 lb 14.4 oz (102.9 kg)  03/22/22 226 lb 14.4 oz (102.9 kg)  02/14/22 235 lb (106.6 kg)      Physical Exam Vitals reviewed.  Constitutional:      General: He is not in acute distress.    Appearance: Normal appearance. He is well-developed. He is not diaphoretic.  HENT:     Head: Normocephalic and atraumatic.     Right Ear: Tympanic membrane, ear canal and external ear normal.     Left Ear: Tympanic membrane, ear canal and external ear normal.     Nose: Nose normal.     Mouth/Throat:     Mouth: Mucous membranes are moist.     Pharynx: Oropharynx is clear. No oropharyngeal exudate.  Eyes:     General: No scleral icterus.    Conjunctiva/sclera: Conjunctivae normal.     Pupils: Pupils are equal, round, and reactive to light.  Neck:     Thyroid: No thyromegaly.  Cardiovascular:     Rate and Rhythm: Normal rate and regular rhythm.     Heart  sounds: Normal heart sounds. No murmur heard. Pulmonary:     Effort: Pulmonary effort is normal. No respiratory distress.     Breath sounds: Normal breath sounds. No wheezing or rales.  Abdominal:     General: There is no distension.     Palpations: Abdomen is soft.     Tenderness: There is no abdominal tenderness.  Musculoskeletal:        General: No deformity.     Cervical back: Neck supple.     Right lower leg: No edema.     Left lower leg: No edema.  Lymphadenopathy:     Cervical: No cervical adenopathy.  Skin:    General: Skin is warm and dry.     Findings: No rash.  Neurological:     Mental Status: He is alert and oriented to person, place, and time. Mental status is at baseline.     Gait: Gait normal.  Psychiatric:        Mood and Affect: Mood normal.        Behavior: Behavior normal.        Thought Content: Thought content normal.       Last depression screening scores    05/17/2022    8:22 AM 02/14/2022    8:25 AM 05/17/2021    2:10 PM  PHQ 2/9 Scores  PHQ - 2 Score 0 0 0  PHQ- 9 Score  0 0   Last fall risk screening    05/17/2022    8:21 AM  Fall Risk  Falls in the past year? 0  Number falls in past yr: 0  Injury with Fall? 0  Risk for fall due to : No Fall Risks  Follow up Falls evaluation completed   Last Audit-C alcohol use screening    05/17/2022    8:22 AM  Alcohol Use Disorder Test (AUDIT)  1. How often do you have a drink containing alcohol? 1  2. How many drinks containing alcohol do you have on a typical day when you are drinking? 0  3. How often do you have six or more drinks on one occasion? 0  AUDIT-C Score 1   A score of 3 or more in women, and 4 or more in men indicates increased risk for alcohol abuse, EXCEPT if all of the points are from question 1   No results found for any visits on 05/17/22.  Assessment & Plan    Routine Health Maintenance and Physical Exam  Exercise Activities and Dietary recommendations  Goals   None      Immunization History  Administered Date(s) Administered   COVID-19, mRNA, vaccine(Comirnaty)12 years and older 12/21/2021   Influenza,inj,Quad PF,6+ Mos 11/02/2018, 11/18/2019, 11/13/2021   PFIZER(Purple Top)SARS-COV-2 Vaccination 04/30/2019, 09/12/2019, 02/12/2020   Pfizer Covid-19 Vaccine Bivalent Booster 68yrs & up 02/12/2021   Pneumococcal Polysaccharide-23 04/13/2008   Tdap 01/01/2010, 03/02/2020   Zoster Recombinat (Shingrix) 05/11/2021, 08/10/2021    Health Maintenance  Topic Date Due   OPHTHALMOLOGY EXAM  Never done   COVID-19 Vaccine (6 - 2023-24 season) 02/15/2022   Diabetic kidney evaluation - Urine ACR  05/12/2022   HEMOGLOBIN A1C  08/15/2022   INFLUENZA VACCINE  08/29/2022   Diabetic kidney evaluation - eGFR measurement  11/14/2022   FOOT EXAM  11/14/2022   COLONOSCOPY (Pts 45-51yrs Insurance coverage will need to be confirmed)  03/22/2025   DTaP/Tdap/Td (3 - Td or Tdap) 03/02/2030   Hepatitis C Screening  Completed   HIV Screening  Completed   Zoster Vaccines- Shingrix  Completed   HPV VACCINES  Aged Out    Discussed health benefits of physical activity, and encouraged him to engage in regular exercise appropriate for his age and condition.  Problem List Items Addressed This Visit       Cardiovascular and Mediastinum   Hypertension associated with diabetes    Well controlled Continue current medications Recheck metabolic panel F/u in 6 months       Relevant Orders   Comprehensive metabolic panel     Endocrine   T2DM (type 2 diabetes mellitus)    Previously uncontrolled with hyperglycemia Recheck A1c today Continue current medications Pending A1c, consider further titration of Ozempic When we get A1c to goal, we will try to decrease and discontinue glipizide Continue ACE/ARB and statin UACR today Advised need for eye exam Follow-up in 3 to 6 months pending A1c      Relevant Orders   Urine microalbumin-creatinine with uACR   HgB A1c    Lipid panel   Hyperlipidemia due to type 2 diabetes mellitus    Previously well controlled Continue statin Repeat FLP and CMP Goal LDL < 70      Relevant Orders   Comprehensive metabolic panel   Lipid panel     Genitourinary   Stage 3a chronic kidney disease    Chronic and stable Recheck metabolic panel Avoid nephrotoxic meds       Relevant Orders   Comprehensive metabolic panel     Other   Family history of thyroid disease  Relevant Orders   TSH   Family history of leukemia   Relevant Orders   CBC with Differential/Platelet   Myalgia    New problem Patient reports that this is mostly at the end of the day and he believes it is due to lifestyle changes and weight loss Advised him that this could be from his statin He is hesitant to change at this time given that his cholesterol has been well-controlled with up Will try Flexeril as needed If not improving, will consider switching his statin to see if this will help instead      Other Visit Diagnoses     Encounter for annual physical exam    -  Primary   Relevant Orders   Urine microalbumin-creatinine with uACR   HgB A1c   Comprehensive metabolic panel   CBC with Differential/Platelet   Lipid panel   TSH        Return in about 3 months (around 08/16/2022) for chronic disease f/u.     I, Shirlee Latch, MD, have reviewed all documentation for this visit. The documentation on 05/17/22 for the exam, diagnosis, procedures, and orders are all accurate and complete.   Dandy Lazaro, Marzella Schlein, MD, MPH [facility]

## 2022-05-17 ENCOUNTER — Ambulatory Visit (INDEPENDENT_AMBULATORY_CARE_PROVIDER_SITE_OTHER): Payer: No Typology Code available for payment source | Admitting: Family Medicine

## 2022-05-17 ENCOUNTER — Encounter: Payer: Self-pay | Admitting: Family Medicine

## 2022-05-17 VITALS — BP 115/78 | HR 96 | Temp 98.1°F | Resp 12 | Ht 75.0 in | Wt 226.9 lb

## 2022-05-17 DIAGNOSIS — I152 Hypertension secondary to endocrine disorders: Secondary | ICD-10-CM

## 2022-05-17 DIAGNOSIS — N1831 Chronic kidney disease, stage 3a: Secondary | ICD-10-CM

## 2022-05-17 DIAGNOSIS — Z8349 Family history of other endocrine, nutritional and metabolic diseases: Secondary | ICD-10-CM | POA: Diagnosis not present

## 2022-05-17 DIAGNOSIS — E785 Hyperlipidemia, unspecified: Secondary | ICD-10-CM | POA: Diagnosis not present

## 2022-05-17 DIAGNOSIS — E1159 Type 2 diabetes mellitus with other circulatory complications: Secondary | ICD-10-CM

## 2022-05-17 DIAGNOSIS — Z0000 Encounter for general adult medical examination without abnormal findings: Secondary | ICD-10-CM

## 2022-05-17 DIAGNOSIS — Z806 Family history of leukemia: Secondary | ICD-10-CM | POA: Diagnosis not present

## 2022-05-17 DIAGNOSIS — M791 Myalgia, unspecified site: Secondary | ICD-10-CM | POA: Insufficient documentation

## 2022-05-17 DIAGNOSIS — E1169 Type 2 diabetes mellitus with other specified complication: Secondary | ICD-10-CM

## 2022-05-17 MED ORDER — CYCLOBENZAPRINE HCL 10 MG PO TABS: 10.0000 mg | ORAL_TABLET | Freq: Three times a day (TID) | ORAL | 1 refills | Status: DC | PRN

## 2022-05-17 NOTE — Assessment & Plan Note (Signed)
Previously uncontrolled with hyperglycemia Recheck A1c today Continue current medications Pending A1c, consider further titration of Ozempic When we get A1c to goal, we will try to decrease and discontinue glipizide Continue ACE/ARB and statin UACR today Advised need for eye exam Follow-up in 3 to 6 months pending A1c

## 2022-05-17 NOTE — Assessment & Plan Note (Signed)
Chronic and stable Recheck metabolic panel Avoid nephrotoxic meds 

## 2022-05-17 NOTE — Assessment & Plan Note (Signed)
New problem Patient reports that this is mostly at the end of the day and he believes it is due to lifestyle changes and weight loss Advised him that this could be from his statin He is hesitant to change at this time given that his cholesterol has been well-controlled with up Will try Flexeril as needed If not improving, will consider switching his statin to see if this will help instead

## 2022-05-17 NOTE — Assessment & Plan Note (Signed)
Previously well controlled Continue statin Repeat FLP and CMP Goal LDL < 70 

## 2022-05-17 NOTE — Assessment & Plan Note (Signed)
Well controlled Continue current medications Recheck metabolic panel F/u in 6 months  

## 2022-05-18 LAB — COMPREHENSIVE METABOLIC PANEL
ALT: 33 IU/L (ref 0–44)
AST: 48 IU/L — ABNORMAL HIGH (ref 0–40)
Albumin/Globulin Ratio: 1.6 (ref 1.2–2.2)
Albumin: 4.6 g/dL (ref 3.8–4.9)
Alkaline Phosphatase: 122 IU/L — ABNORMAL HIGH (ref 44–121)
BUN/Creatinine Ratio: 16 (ref 9–20)
BUN: 23 mg/dL (ref 6–24)
Bilirubin Total: 0.6 mg/dL (ref 0.0–1.2)
CO2: 20 mmol/L (ref 20–29)
Calcium: 10.1 mg/dL (ref 8.7–10.2)
Chloride: 101 mmol/L (ref 96–106)
Creatinine, Ser: 1.44 mg/dL — ABNORMAL HIGH (ref 0.76–1.27)
Globulin, Total: 2.8 g/dL (ref 1.5–4.5)
Glucose: 91 mg/dL (ref 70–99)
Potassium: 5.2 mmol/L (ref 3.5–5.2)
Sodium: 139 mmol/L (ref 134–144)
Total Protein: 7.4 g/dL (ref 6.0–8.5)
eGFR: 58 mL/min/{1.73_m2} — ABNORMAL LOW (ref >59)

## 2022-05-18 LAB — CBC WITH DIFFERENTIAL/PLATELET
Basophils Absolute: 0.1 10*3/uL (ref 0.0–0.2)
Basos: 1 %
EOS (ABSOLUTE): 0.2 10*3/uL (ref 0.0–0.4)
Eos: 2 %
Hematocrit: 46.4 % (ref 37.5–51.0)
Hemoglobin: 15.8 g/dL (ref 13.0–17.7)
Immature Grans (Abs): 0 10*3/uL (ref 0.0–0.1)
Immature Granulocytes: 0 %
Lymphocytes Absolute: 3.4 10*3/uL — ABNORMAL HIGH (ref 0.7–3.1)
Lymphs: 31 %
MCH: 28.7 pg (ref 26.6–33.0)
MCHC: 34.1 g/dL (ref 31.5–35.7)
MCV: 84 fL (ref 79–97)
Monocytes Absolute: 0.6 10*3/uL (ref 0.1–0.9)
Monocytes: 5 %
Neutrophils Absolute: 6.7 10*3/uL (ref 1.4–7.0)
Neutrophils: 61 %
Platelets: 208 10*3/uL (ref 150–450)
RBC: 5.51 x10E6/uL (ref 4.14–5.80)
RDW: 14.6 % (ref 11.6–15.4)
WBC: 11 10*3/uL — ABNORMAL HIGH (ref 3.4–10.8)

## 2022-05-18 LAB — LIPID PANEL
Chol/HDL Ratio: 2.6 ratio (ref 0.0–5.0)
Cholesterol, Total: 111 mg/dL (ref 100–199)
HDL: 43 mg/dL (ref >39)
LDL Chol Calc (NIH): 54 mg/dL (ref 0–99)
Triglycerides: 67 mg/dL (ref 0–149)
VLDL Cholesterol Cal: 14 mg/dL (ref 5–40)

## 2022-05-18 LAB — MICROALBUMIN / CREATININE URINE RATIO
Creatinine, Urine: 301.4 mg/dL
Microalb/Creat Ratio: 2 mg/g creat (ref 0–29)
Microalbumin, Urine: 6.4 ug/mL

## 2022-05-18 LAB — TSH: TSH: 1.2 u[IU]/mL (ref 0.450–4.500)

## 2022-05-18 LAB — HEMOGLOBIN A1C
Est. average glucose Bld gHb Est-mCnc: 146 mg/dL
Hgb A1c MFr Bld: 6.7 % — ABNORMAL HIGH (ref 4.8–5.6)

## 2022-05-23 ENCOUNTER — Other Ambulatory Visit: Payer: Self-pay

## 2022-05-23 MED ORDER — SEMAGLUTIDE (2 MG/DOSE) 8 MG/3ML ~~LOC~~ SOPN: 2.0000 mg | PEN_INJECTOR | SUBCUTANEOUS | 0 refills | Status: DC

## 2022-06-04 ENCOUNTER — Other Ambulatory Visit: Payer: Self-pay | Admitting: Family Medicine

## 2022-06-04 DIAGNOSIS — N529 Male erectile dysfunction, unspecified: Secondary | ICD-10-CM

## 2022-07-02 ENCOUNTER — Other Ambulatory Visit: Payer: Self-pay | Admitting: Family Medicine

## 2022-07-02 DIAGNOSIS — E1169 Type 2 diabetes mellitus with other specified complication: Secondary | ICD-10-CM

## 2022-07-03 NOTE — Telephone Encounter (Signed)
Labs in date  Requested Prescriptions  Pending Prescriptions Disp Refills   metFORMIN (GLUCOPHAGE-XR) 750 MG 24 hr tablet [Pharmacy Med Name: METFORMIN HCL ER 750 MG TABLET] 180 tablet 1    Sig: TAKE 2 TABLETS (1,500 MG TOTAL) BY MOUTH EVERY DAY WITH BREAKFAST     Endocrinology:  Diabetes - Biguanides Failed - 07/02/2022  5:42 PM      Failed - Cr in normal range and within 360 days    Creatinine, Ser  Date Value Ref Range Status  05/17/2022 1.44 (H) 0.76 - 1.27 mg/dL Final         Failed - eGFR in normal range and within 360 days    GFR calc Af Amer  Date Value Ref Range Status  03/02/2020 38 (L) >59 mL/min/1.73 Final    Comment:    **In accordance with recommendations from the NKF-ASN Task force,**   [facility] is in the process of updating its eGFR calculation to the   2021 CKD-EPI creatinine equation that estimates kidney function   without a race variable.    GFR, Estimated  Date Value Ref Range Status  03/09/2020 42 (L) >60 mL/min Final    Comment:    (NOTE) Calculated using the CKD-EPI Creatinine Equation (2021)    eGFR  Date Value Ref Range Status  05/17/2022 58 (L) >59 mL/min/1.73 Final         Failed - B12 Level in normal range and within 720 days    No results found for: "VITAMINB12"       Passed - HBA1C is between 0 and 7.9 and within 180 days    Hemoglobin A1C  Date Value Ref Range Status  11/28/2018 7.7  Final   Hgb A1c MFr Bld  Date Value Ref Range Status  05/17/2022 6.7 (H) 4.8 - 5.6 % Final    Comment:             Prediabetes: 5.7 - 6.4          Diabetes: >6.4          Glycemic control for adults with diabetes: <7.0          Passed - Valid encounter within last 6 months    Recent Outpatient Visits           1 month ago Encounter for annual physical exam   [place] [facility] [place], Marzella Schlein, MD   4 months ago Type 2 diabetes mellitus with other specified complication, without long-term current use of insulin [facility])    [place] [facility], Marzella Schlein, MD   7 months ago Hypertension associated with diabetes [facility])   [place] [facility] [place], Marzella Schlein, MD   10 months ago Type 2 diabetes mellitus with other specified complication, without long-term current use of insulin [facility])   [place] [facility] Merita Norton T, FNP   1 year ago Osteoarthritis of first metatarsophalangeal (MTP) joint of left foot   [place] [facility] [place], Ocie Bob, MD       Future Appointments             In 1 month Bacigalupo, Marzella Schlein, MD [facility]   In 4 months Bacigalupo, Marzella Schlein, MD [facility]            Passed - CBC within normal limits and completed in the last 12 months  WBC  Date Value Ref Range Status  05/17/2022 11.0 (H) 3.4 - 10.8 x10E3/uL Final  03/09/2020 13.7 (H) 4.0 - 10.5 K/uL Final   RBC  Date Value Ref Range Status  05/17/2022 5.51 4.14 - 5.80 x10E6/uL Final  03/09/2020 6.56 (H) 4.22 - 5.81 MIL/uL Final   Hemoglobin  Date Value Ref Range Status  05/17/2022 15.8 13.0 - 17.7 g/dL Final   Hematocrit  Date Value Ref Range Status  05/17/2022 46.4 37.5 - 51.0 % Final   MCHC  Date Value Ref Range Status  05/17/2022 34.1 31.5 - 35.7 g/dL Final  16/10/9602 54.0 30.0 - 36.0 g/dL Final   [facility]  Date Value Ref Range Status  05/17/2022 28.7 26.6 - 33.0 pg Final  03/09/2020 27.7 26.0 - 34.0 pg Final   MCV  Date Value Ref Range Status  05/17/2022 84 79 - 97 fL Final   No results found for: "PLTCOUNTKUC", "LABPLAT", "POCPLA" RDW  Date Value Ref Range Status  05/17/2022 14.6 11.6 - 15.4 % Final          OZEMPIC, 2 MG/DOSE, 8 MG/3ML SOPN [Pharmacy Med Name: OZEMPIC 8 MG/3 ML (2 MG/DOSE)] 9 mL 0    Sig: INJECT 2 MG AS DIRECTED ONCE A WEEK.     Endocrinology:  Diabetes - GLP-1 Receptor Agonists - semaglutide Failed -  07/02/2022  5:42 PM      Failed - HBA1C in normal range and within 180 days    Hemoglobin A1C  Date Value Ref Range Status  11/28/2018 7.7  Final   Hgb A1c MFr Bld  Date Value Ref Range Status  05/17/2022 6.7 (H) 4.8 - 5.6 % Final    Comment:             Prediabetes: 5.7 - 6.4          Diabetes: >6.4          Glycemic control for adults with diabetes: <7.0          Failed - Cr in normal range and within 360 days    Creatinine, Ser  Date Value Ref Range Status  05/17/2022 1.44 (H) 0.76 - 1.27 mg/dL Final         Passed - Valid encounter within last 6 months    Recent Outpatient Visits           1 month ago Encounter for annual physical exam   [place] [facility] [place], Marzella Schlein, MD   4 months ago Type 2 diabetes mellitus with other specified complication, without long-term current use of insulin [facility])   [facility] [place], Marzella Schlein, MD   7 months ago Hypertension associated with diabetes [facility])   [place] [facility] [place], Marzella Schlein, MD   10 months ago Type 2 diabetes mellitus with other specified complication, without long-term current use of insulin [facility])   [place] [facility] Merita Norton T, FNP   1 year ago Osteoarthritis of first metatarsophalangeal (MTP) joint of left foot   [place] [facility], Ocie Bob, MD       Future Appointments             In 1 month Bacigalupo, Marzella Schlein, MD [facility]   In 4 months Bacigalupo, Marzella Schlein, MD [facility]             OZEMPIC, 1 MG/DOSE,  4 MG/3ML SOPN [Pharmacy Med Name: OZEMPIC 4 MG/3 ML (1 MG/DOSE)]  1    Sig: INJECT 1 MG ONCE A WEEK AS DIRECTED     Endocrinology:  Diabetes - GLP-1 Receptor Agonists - semaglutide Failed - 07/02/2022  5:42 PM      Failed - HBA1C in normal range and within 180 days    Hemoglobin A1C   Date Value Ref Range Status  11/28/2018 7.7  Final   Hgb A1c MFr Bld  Date Value Ref Range Status  05/17/2022 6.7 (H) 4.8 - 5.6 % Final    Comment:             Prediabetes: 5.7 - 6.4          Diabetes: >6.4          Glycemic control for adults with diabetes: <7.0          Failed - Cr in normal range and within 360 days    Creatinine, Ser  Date Value Ref Range Status  05/17/2022 1.44 (H) 0.76 - 1.27 mg/dL Final         Passed - Valid encounter within last 6 months    Recent Outpatient Visits           1 month ago Encounter for annual physical exam   [place] [facility] [place], Marzella Schlein, MD   4 months ago Type 2 diabetes mellitus with other specified complication, without long-term current use of insulin [facility])   [place] [facility] [place], Marzella Schlein, MD   7 months ago Hypertension associated with diabetes [facility])   [place] [facility] [place], Marzella Schlein, MD   10 months ago Type 2 diabetes mellitus with other specified complication, without long-term current use of insulin [facility])   [place] [facility] Merita Norton T, FNP   1 year ago Osteoarthritis of first metatarsophalangeal (MTP) joint of left foot   [place] [facility], Ocie Bob, MD       Future Appointments             In 1 month Bacigalupo, Marzella Schlein, MD [facility]   In 4 months Bacigalupo, Marzella Schlein, [facility]

## 2022-07-13 DIAGNOSIS — H524 Presbyopia: Secondary | ICD-10-CM | POA: Diagnosis not present

## 2022-07-13 DIAGNOSIS — E119 Type 2 diabetes mellitus without complications: Secondary | ICD-10-CM | POA: Diagnosis not present

## 2022-07-13 DIAGNOSIS — H40013 Open angle with borderline findings, low risk, bilateral: Secondary | ICD-10-CM | POA: Diagnosis not present

## 2022-07-13 LAB — HM DIABETES EYE EXAM

## 2022-08-02 ENCOUNTER — Other Ambulatory Visit: Payer: Self-pay | Admitting: Family Medicine

## 2022-08-09 ENCOUNTER — Telehealth: Payer: Self-pay

## 2022-08-09 NOTE — Telephone Encounter (Signed)
Copied from CRM [id number]. 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 Fax  - [phone]   Please advise.

## 2022-08-09 NOTE — Telephone Encounter (Signed)
Faxed to number provided

## 2022-08-19 ENCOUNTER — Ambulatory Visit: Payer: No Typology Code available for payment source | Admitting: Family Medicine

## 2022-08-19 VITALS — BP 122/86 | HR 94 | Temp 98.4°F | Resp 14 | Ht 75.0 in | Wt 225.7 lb

## 2022-08-19 DIAGNOSIS — I152 Hypertension secondary to endocrine disorders: Secondary | ICD-10-CM | POA: Diagnosis not present

## 2022-08-19 DIAGNOSIS — I10 Essential (primary) hypertension: Secondary | ICD-10-CM | POA: Diagnosis not present

## 2022-08-19 DIAGNOSIS — E785 Hyperlipidemia, unspecified: Secondary | ICD-10-CM

## 2022-08-19 DIAGNOSIS — E1159 Type 2 diabetes mellitus with other circulatory complications: Secondary | ICD-10-CM

## 2022-08-19 DIAGNOSIS — N1831 Chronic kidney disease, stage 3a: Secondary | ICD-10-CM | POA: Diagnosis not present

## 2022-08-19 DIAGNOSIS — E1169 Type 2 diabetes mellitus with other specified complication: Secondary | ICD-10-CM | POA: Diagnosis not present

## 2022-08-19 LAB — POCT GLYCOSYLATED HEMOGLOBIN (HGB A1C): Hemoglobin A1C: 7.7 % — AB (ref 4.0–5.6)

## 2022-08-19 MED ORDER — METFORMIN HCL ER 750 MG PO TB24
750.0000 mg | ORAL_TABLET | Freq: Every day | ORAL | 1 refills | Status: DC
Start: 2022-08-19 — End: 2023-01-28

## 2022-08-19 MED ORDER — OZEMPIC (2 MG/DOSE) 8 MG/3ML ~~LOC~~ SOPN: 2.0000 mg | PEN_INJECTOR | SUBCUTANEOUS | 1 refills | Status: DC

## 2022-08-19 MED ORDER — HYDROCHLOROTHIAZIDE 25 MG PO TABS: 25.0000 mg | ORAL_TABLET | Freq: Every day | ORAL | 1 refills | Status: DC

## 2022-08-19 MED ORDER — AMLODIPINE BESYLATE 10 MG PO TABS: 10.0000 mg | ORAL_TABLET | Freq: Every day | ORAL | 1 refills | Status: DC

## 2022-08-19 MED ORDER — ATORVASTATIN CALCIUM 80 MG PO TABS: 80.0000 mg | ORAL_TABLET | Freq: Every day | ORAL | 1 refills | Status: DC

## 2022-08-19 MED ORDER — LISINOPRIL 40 MG PO TABS
40.0000 mg | ORAL_TABLET | Freq: Every day | ORAL | 1 refills | Status: DC
Start: 2022-08-19 — End: 2023-02-05

## 2022-08-19 NOTE — Assessment & Plan Note (Addendum)
Well controlled. Last HbgA1c in April was 6.7. Since last visit, stopped glipizide and increased dose of Ozempic to 2 mg. Patient is adherent to medications and is not having any side effects.  Continue current medications  Recheck POCT HbgA1c   Follow up in 3 mo

## 2022-08-19 NOTE — Assessment & Plan Note (Signed)
Well controlled. Last lipid panel was within normal limits. Will CTM. Continue statin

## 2022-08-19 NOTE — Assessment & Plan Note (Signed)
Well controlled. Blood pressure was at goal today in office w/ a BP of 122/86.  Continue current medications

## 2022-08-19 NOTE — Progress Notes (Signed)
Established Patient Office Visit  Subjective   Patient ID: Timothy Douglas, male    DOB: Jun 18, 1970  Age: 52 y.o. MRN: [id number]  Chief Complaint  Patient presents with   Medical Management of Chronic Issues   Merlyn Albert is here today to follow up on medical management of his chronic conditions.  Overall, he feels as though things are going well and he is not having any side effects to his knowledge.  Last visit, his dose of Ozempic was increased. He is tolerating that dose well and has noted no adverse effects such as nausea or reflux. He has just focused on eating smaller meals. He has noted a 15 - 20 pound weight loss since initiation of Ozempic based on the way his clothes are fitting. When taking at home blood sugar readings, they have been at goal. He feels as though his blood pressure is stable. He does not take at home blood pressure readings.     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 04/19/2021   Plantar fasciitis, left 04/19/2021   Achilles tendinosis 04/05/2021   Bursitis of left shoulder 11/18/2019   Special screening for malignant neoplasms, colon    Polyp of transverse colon    Family history of thyroid disease 02/05/2019   Family history of leukemia 02/05/2019   Decreased libido 02/05/2019   Erectile dysfunction 02/05/2019   T2DM (type 2 diabetes mellitus) (HCC)    Hypertension associated with diabetes (HCC)    Hyperlipidemia due to type 2 diabetes mellitus (HCC)    Spondylolisthesis of lumbar region 04/08/2014      Review of Systems  Constitutional:  Positive for weight loss.  Gastrointestinal:  Negative for heartburn, nausea  and vomiting.     Objective:     BP 122/86 (BP Location: Right Arm, Patient Position: Sitting, Cuff Size: Large)   Pulse 94   Temp 98.4 F (36.9 C) (Oral)   Resp 14   Ht 6\' 3"  (1.905 m)   Wt 225 lb 11.2 oz (102.4 kg)   SpO2 100%   BMI 28.21 kg/m  BP Readings from Last 3 Encounters:  08/19/22 122/86  05/17/22 115/78  03/22/22 113/86   Wt Readings from Last 3 Encounters:  08/19/22 225 lb 11.2 oz (102.4 kg)  05/17/22 226 lb 14.4 oz (102.9 kg)  03/22/22 226 lb 14.4 oz (102.9 kg)   Physical Exam Constitutional:      Appearance: Normal appearance.  Cardiovascular:     Rate and Rhythm: Normal rate and regular rhythm.     Heart sounds: Normal heart sounds.  Pulmonary:     Effort: Pulmonary effort is normal.     Breath sounds: Normal breath sounds.  Neurological:     General: No focal deficit present.     Mental Status: He is alert and oriented to person, place, and time.    No results found for any visits on 08/19/22.  Last metabolic panel Lab Results  Component Value Date   GLUCOSE 91 05/17/2022   NA 139 05/17/2022   K 5.2 05/17/2022   CL 101 05/17/2022   CO2 20 05/17/2022   BUN 23 05/17/2022   CREATININE 1.44 (H)  05/17/2022   EGFR 58 (L) 05/17/2022   CALCIUM 10.1 05/17/2022   PROT 7.4 05/17/2022   ALBUMIN 4.6 05/17/2022   LABGLOB 2.8 05/17/2022   AGRATIO 1.6 05/17/2022   BILITOT 0.6 05/17/2022   ALKPHOS 122 (H) 05/17/2022   AST 48 (H) 05/17/2022   ALT 33 05/17/2022   ANIONGAP 14 03/09/2020   Last lipids Lab Results  Component Value Date   CHOL 111 05/17/2022   HDL 43 05/17/2022   LDLCALC 54 05/17/2022   TRIG 67 05/17/2022   CHOLHDL 2.6 05/17/2022   Last hemoglobin A1c Lab Results  Component Value Date   HGBA1C 6.7 (H) 05/17/2022     The ASCVD Risk score (Arnett DK, et al., 2019) failed to calculate for the following reasons:   The valid total cholesterol range is 130 to 320 mg/dL    Assessment & Plan:   Problem List Items Addressed This  Visit       Cardiovascular and Mediastinum   Hypertension associated with diabetes (HCC)    Well controlled. Blood pressure was at goal today in office w/ a BP of 122/86.  Continue current medications         Endocrine   T2DM (type 2 diabetes mellitus) (HCC) - Primary    Well controlled. Last HbgA1c in April was 6.7. Since last visit, stopped glipizide and increased dose of Ozempic to 2 mg. Patient is adherent to medications and is not having any side effects.  Continue current medications  Recheck POCT HbgA1c   Follow up in 3 mo      Relevant Orders   POCT HgB A1C   Hyperlipidemia due to type 2 diabetes mellitus (HCC)    Well controlled. Last lipid panel was within normal limits. Will CTM. Continue statin        Return in about 3 months (around 11/19/2022) for chronic disease f/u.    Rometta Emery, Medical Student   Patient seen along with MS3 student Jodi Marble. I personally evaluated this patient along with the student, and verified all aspects of the history, physical exam, and medical decision making as documented by the student. I agree with the student's documentation and have made all necessary edits.  Amayia Ciano, Marzella Schlein, MD, MPH [facility]

## 2022-08-30 ENCOUNTER — Other Ambulatory Visit: Payer: Self-pay | Admitting: Family Medicine

## 2022-08-30 NOTE — Telephone Encounter (Signed)
Requested Prescriptions  Pending Prescriptions Disp Refills   indomethacin (INDOCIN) 25 MG capsule [Pharmacy Med Name: INDOMETHACIN 25 MG CAPSULE] 90 capsule 0    Sig: TAKE 1 CAPSULE BY MOUTH THREE TIMES A DAY AS NEEDED     Analgesics:  NSAIDS Failed - 08/30/2022  2:40 AM      Failed - Manual Review: Labs are only required if the patient has taken medication for more than 8 weeks.      Failed - Cr in normal range and within 360 days    Creatinine, Ser  Date Value Ref Range Status  05/17/2022 1.44 (H) 0.76 - 1.27 mg/dL Final         Passed - HGB in normal range and within 360 days    Hemoglobin  Date Value Ref Range Status  05/17/2022 15.8 13.0 - 17.7 g/dL Final         Passed - PLT in normal range and within 360 days    Platelets  Date Value Ref Range Status  05/17/2022 208 150 - 450 x10E3/uL Final         Passed - HCT in normal range and within 360 days    Hematocrit  Date Value Ref Range Status  05/17/2022 46.4 37.5 - 51.0 % Final         Passed - eGFR is 30 or above and within 360 days    GFR calc Af Amer  Date Value Ref Range Status  03/02/2020 38 (L) >59 mL/min/1.73 Final    Comment:    **In accordance with recommendations from the NKF-ASN Task force,**   [facility] is in the process of updating its eGFR calculation to the   2021 CKD-EPI creatinine equation that estimates kidney function   without a race variable.    GFR, Estimated  Date Value Ref Range Status  03/09/2020 42 (L) >60 mL/min Final    Comment:    (NOTE) Calculated using the CKD-EPI Creatinine Equation (2021)    eGFR  Date Value Ref Range Status  05/17/2022 58 (L) >59 mL/min/1.73 Final         Passed - Patient is not pregnant      Passed - Valid encounter within last 12 months    Recent Outpatient Visits           1 week ago Type 2 diabetes mellitus with other specified complication, without long-term current use of insulin (HCC)   [place] [facility] [place], Marzella Schlein, MD   3 months ago Encounter for annual physical exam   [place] [facility] [place], Marzella Schlein, MD   6 months ago Type 2 diabetes mellitus with other specified complication, without long-term current use of insulin [facility])   [place] [facility] [place], Marzella Schlein, MD   9 months ago Hypertension associated with diabetes [facility])   [place] [facility] [place], Marzella Schlein, MD   1 year ago Type 2 diabetes mellitus with other specified complication, without long-term current use of insulin [facility])   [place] Merita Norton T, FNP       Future Appointments             In 2 months Bacigalupo, Marzella Schlein, MD [facility]   In 2 months Bacigalupo, Marzella Schlein, MD [facility], [facility]

## 2022-09-28 ENCOUNTER — Other Ambulatory Visit: Payer: Self-pay | Admitting: Family Medicine

## 2022-10-18 ENCOUNTER — Ambulatory Visit: Payer: Self-pay

## 2022-10-18 MED ORDER — CYCLOBENZAPRINE HCL 10 MG PO TABS: 10.0000 mg | ORAL_TABLET | Freq: Three times a day (TID) | ORAL | 1 refills | Status: DC | PRN

## 2022-10-18 NOTE — Telephone Encounter (Signed)
     Chief Complaint: Mid-low back pain."I have disc issues.I would like to have the Flexeril dose increased if possible." No availability in office today. Symptoms: Pain, 10/10 Frequency: This week Pertinent Negatives: Patient denies  Disposition: [] ED /[] Urgent Care (no appt availability in office) / [] Appointment(In office/virtual)/ []  [facility]/ [] Home Care/ [] Refused Recommended Disposition /[] [facility]/ [x]  Follow-up with PCP Additional Notes: Please advise pt. Only has 3 Flexeril tablets left.  Reason for Disposition  [1] MODERATE back pain (e.g., interferes with normal activities) AND [2] present > 3 days  Answer Assessment - Initial Assessment Questions 1. ONSET: "When did the pain begin?"      This week 2. LOCATION: "Where does it hurt?" (upper, mid or lower back)     Mid-lower back 3. SEVERITY: "How bad is the pain?"  (e.g., Scale 1-10; mild, moderate, or severe)   - MILD (1-3): Doesn't interfere with normal activities.    - MODERATE (4-7): Interferes with normal activities or awakens from sleep.    - SEVERE (8-10): Excruciating pain, unable to do any normal activities.      10 4. PATTERN: "Is the pain constant?" (e.g., yes, no; constant, intermittent)      Comes and goes 5. RADIATION: "Does the pain shoot into your legs or somewhere else?"     Leg 6. CAUSE:  "What do you think is causing the back pain?"      Disc 7. BACK OVERUSE:  "Any recent lifting of heavy objects, strenuous work or exercise?"     No 8. MEDICINES: "What have you taken so far for the pain?" (e.g., nothing, acetaminophen, NSAIDS)     Flexeril 9. NEUROLOGIC SYMPTOMS: "Do you have any weakness, numbness, or problems with bowel/bladder control?"     With pain, legs feel weak 10. OTHER SYMPTOMS: "Do you have any other symptoms?" (e.g., fever, abdomen pain, burning with urination, blood in urine)       No 11. PREGNANCY: "Is there any chance you are pregnant?" "When was your last  menstrual period?"       N/a  Protocols used: Back Pain-A-AH

## 2022-10-30 ENCOUNTER — Other Ambulatory Visit: Payer: Self-pay | Admitting: Family Medicine

## 2022-10-30 NOTE — Telephone Encounter (Signed)
Requested medication (s) are due for refill today: yes  Requested medication (s) are on the active medication list: yes  Last refill:  10/01/22 #90   Future visit scheduled: yes  Notes to clinic:  Cr out of normal range- have seen this is a trend but was not addressed or noted in result notes   Requested Prescriptions  Pending Prescriptions Disp Refills   indomethacin (INDOCIN) 25 MG capsule [Pharmacy Med Name: INDOMETHACIN 25 MG CAPSULE] 90 capsule 0    Sig: TAKE 1 CAPSULE BY MOUTH THREE TIMES A DAY AS NEEDED     Analgesics:  NSAIDS Failed - 10/30/2022  1:28 AM      Failed - Manual Review: Labs are only required if the patient has taken medication for more than 8 weeks.      Failed - Cr in normal range and within 360 days    Creatinine, Ser  Date Value Ref Range Status  05/17/2022 1.44 (H) 0.76 - 1.27 mg/dL Final         Passed - HGB in normal range and within 360 days    Hemoglobin  Date Value Ref Range Status  05/17/2022 15.8 13.0 - 17.7 g/dL Final         Passed - PLT in normal range and within 360 days    Platelets  Date Value Ref Range Status  05/17/2022 208 150 - 450 x10E3/uL Final         Passed - HCT in normal range and within 360 days    Hematocrit  Date Value Ref Range Status  05/17/2022 46.4 37.5 - 51.0 % Final         Passed - eGFR is 30 or above and within 360 days    GFR calc Af Amer  Date Value Ref Range Status  03/02/2020 38 (L) >59 mL/min/1.73 Final    Comment:    **In accordance with recommendations from the NKF-ASN Task force,**   [facility] is in the process of updating its eGFR calculation to the   2021 CKD-EPI creatinine equation that estimates kidney function   without a race variable.    GFR, Estimated  Date Value Ref Range Status  03/09/2020 42 (L) >60 mL/min Final    Comment:    (NOTE) Calculated using the CKD-EPI Creatinine Equation (2021)    eGFR  Date Value Ref Range Status  05/17/2022 58 (L) >59 mL/min/1.73 Final          Passed - Patient is not pregnant      Passed - Valid encounter within last 12 months    Recent Outpatient Visits           2 months ago Type 2 diabetes mellitus with other specified complication, without long-term current use of insulin (HCC)   [place] [facility] [place], Marzella Schlein, MD   5 months ago Encounter for annual physical exam   [facility] [place], Marzella Schlein, MD   8 months ago Type 2 diabetes mellitus with other specified complication, without long-term current use of insulin [facility])   [place] [facility] [place], Marzella Schlein, MD   11 months ago Hypertension associated with diabetes [facility])   [place] [facility] [place], Marzella Schlein, MD   1 year ago Type 2 diabetes mellitus with other specified complication, without long-term current use of insulin [facility])    [facility] Jacky [place], [place]       Future Appointments  In 2 weeks Bacigalupo, Marzella Schlein, MD [facility], [facility]   In 2 weeks Bacigalupo, Marzella Schlein, MD [facility], [facility]

## 2022-11-14 ENCOUNTER — Other Ambulatory Visit: Payer: Self-pay | Admitting: Family Medicine

## 2022-11-14 DIAGNOSIS — N529 Male erectile dysfunction, unspecified: Secondary | ICD-10-CM

## 2022-11-14 MED ORDER — INDOMETHACIN 25 MG PO CAPS: ORAL_CAPSULE | ORAL | 0 refills | Status: DC

## 2022-11-14 MED ORDER — TADALAFIL 20 MG PO TABS
10.0000 mg | ORAL_TABLET | Freq: Every day | ORAL | 5 refills | Status: DC | PRN
Start: 2022-11-14 — End: 2022-12-03

## 2022-11-14 NOTE — Telephone Encounter (Signed)
Need medications refilled before 11/5 appt for his 6 mo f/u

## 2022-11-18 ENCOUNTER — Ambulatory Visit: Payer: No Typology Code available for payment source | Admitting: Family Medicine

## 2022-11-19 ENCOUNTER — Ambulatory Visit: Payer: No Typology Code available for payment source | Admitting: Family Medicine

## 2022-11-20 ENCOUNTER — Ambulatory Visit: Payer: No Typology Code available for payment source | Admitting: Family Medicine

## 2022-11-20 ENCOUNTER — Encounter: Payer: Self-pay | Admitting: Family Medicine

## 2022-11-20 ENCOUNTER — Ambulatory Visit: Payer: Self-pay | Admitting: *Deleted

## 2022-11-20 VITALS — BP 132/74 | HR 94 | Temp 97.9°F | Resp 16 | Ht 75.0 in | Wt 218.0 lb

## 2022-11-20 DIAGNOSIS — J329 Chronic sinusitis, unspecified: Secondary | ICD-10-CM

## 2022-11-20 DIAGNOSIS — R051 Acute cough: Secondary | ICD-10-CM

## 2022-11-20 MED ORDER — LORATADINE 10 MG PO TABS
10.0000 mg | ORAL_TABLET | Freq: Every day | ORAL | 11 refills | Status: DC
Start: 2022-11-20 — End: 2022-12-03

## 2022-11-20 MED ORDER — PROMETHAZINE-DM 6.25-15 MG/5ML PO SYRP: 2.5000 mL | ORAL_SOLUTION | Freq: Four times a day (QID) | ORAL | 0 refills | Status: DC | PRN

## 2022-11-20 MED ORDER — BENZONATATE 100 MG PO CAPS: 100.0000 mg | ORAL_CAPSULE | Freq: Three times a day (TID) | ORAL | 0 refills | Status: DC | PRN

## 2022-11-20 MED ORDER — DOXYCYCLINE HYCLATE 100 MG PO TABS: 100.0000 mg | ORAL_TABLET | Freq: Two times a day (BID) | ORAL | 0 refills | Status: AC

## 2022-11-20 NOTE — Progress Notes (Signed)
Patient ID: Timothy Douglas, male    DOB: 1970-05-13, 52 y.o.   MRN: [id number]  PCP: Erasmo Downer, MD  Chief Complaint  Patient presents with   Sinusitis   Cough    X11 days, productive    Subjective:   Timothy Douglas is a 52 y.o. male, presents to clinic with CC of the following:  HPI  Pt presents for 11 days of sinus congestion, cough and postnasal drip not improved with OTC medications He has tried benadryl, robitussin, dayquil, nyquil 3 days ago he had acute worsening of pain and pressure to his sinuses and face - severe pressure/pain/HA  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 04/19/2021   Plantar fasciitis, left 04/19/2021   Achilles tendinosis 04/05/2021   Bursitis of left shoulder 11/18/2019   Special screening for malignant neoplasms, colon    Polyp of transverse colon    Family history of thyroid disease 02/05/2019   Family history of leukemia 02/05/2019   Decreased libido 02/05/2019   Erectile dysfunction 02/05/2019   T2DM (type 2 diabetes mellitus) (HCC)    Hypertension associated with diabetes (HCC)    Hyperlipidemia due to type 2 diabetes mellitus (HCC)    Spondylolisthesis of lumbar region 04/08/2014      Current Outpatient Medications:    amLODipine (NORVASC) 10 MG tablet, Take 1 tablet (10 mg total) by mouth daily., Disp: 90 tablet, Rfl: 1   Ascorbic Acid (VITAMIN C PO), Take by mouth daily., Disp: , Rfl:    atorvastatin (LIPITOR) 80 MG tablet, Take 1 tablet (80 mg total) by mouth daily., Disp: 90 tablet, Rfl: 1   benzonatate (TESSALON) 100 MG capsule, Take 1-2 capsules (100-200 mg total) by mouth 3  (three) times daily as needed for cough., Disp: 30 capsule, Rfl: 0   cyclobenzaprine (FLEXERIL) 10 MG tablet, Take 1 tablet (10 mg total) by mouth 3 (three) times daily as needed for muscle spasms., Disp: 30 tablet, Rfl: 1   diclofenac (VOLTAREN) 75 MG EC tablet, Take 1 tablet (75 mg total) by mouth 2 (two) times daily as needed., Disp: 180 tablet, Rfl: 0   doxycycline (VIBRA-TABS) 100 MG tablet, Take 1 tablet (100 mg total) by mouth 2 (two) times daily for 7 days., Disp: 14 tablet, Rfl: 0   hydrochlorothiazide (HYDRODIURIL) 25 MG tablet, Take 1 tablet (25 mg total) by mouth daily., Disp: 90 tablet, Rfl: 1   indomethacin (INDOCIN) 25 MG capsule, TAKE 1 CAPSULE BY MOUTH THREE TIMES A DAY AS NEEDED, Disp: 90 capsule, Rfl: 0   lidocaine (XYLOCAINE) 2 % solution, Please specify directions, refills and quantity, Disp: 100 mL, Rfl: 0   lisinopril (ZESTRIL) 40 MG tablet, Take 1 tablet (40 mg total) by mouth daily., Disp: 90 tablet, Rfl: 1   loratadine (CLARITIN) 10 MG tablet, Take 1 tablet (10 mg total) by mouth daily., Disp: 30 tablet, Rfl: 11   meclizine (ANTIVERT) 25 MG tablet, TAKE 1 TABLET BY MOUTH EVERY 6 HOURS AS NEEDED., Disp: 90 tablet, Rfl: 0   metFORMIN (GLUCOPHAGE-XR) 750 MG 24 hr tablet, Take 1 tablet (750 mg total) by mouth daily with breakfast., Disp: 180 tablet, Rfl: 1  Multiple Vitamin (MULTIVITAMIN) tablet, Take 1 tablet by mouth daily., Disp: , Rfl:    promethazine-dextromethorphan (PROMETHAZINE-DM) 6.25-15 MG/5ML syrup, Take 2.5-5 mLs by mouth 4 (four) times daily as needed for cough., Disp: 118 mL, Rfl: 0   Semaglutide, 2 MG/DOSE, (OZEMPIC, 2 MG/DOSE,) 8 MG/3ML SOPN, Inject 2 mg into the skin once a week., Disp: 9 mL, Rfl: 1   tadalafil (CIALIS) 20 MG tablet, Take 0.5 tablets (10 mg total) by mouth daily as needed for erectile dysfunction., Disp: 30 tablet, Rfl: 5   Allergies  Allergen Reactions   Other Rash   Tape Rash     Social History   Tobacco Use   Smoking status:  Never   Smokeless tobacco: Never  Vaping Use   Vaping status: Never Used  Substance Use Topics   Alcohol use: Yes    Alcohol/week: 2.0 - 3.0 standard drinks of alcohol    Types: 1 - 2 Glasses of wine, 1 Cans of beer per week   Drug use: Never      Chart Review Today: I personally reviewed active problem list, medication list, allergies, family history, social history, health maintenance, notes from last encounter, lab results, imaging with the patient/caregiver today.   Review of Systems  Constitutional: Negative.   HENT: Negative.    Eyes: Negative.   Respiratory: Negative.    Cardiovascular: Negative.   Gastrointestinal: Negative.   Endocrine: Negative.   Genitourinary: Negative.   Musculoskeletal: Negative.   Skin: Negative.   Allergic/Immunologic: Negative.   Neurological: Negative.   Hematological: Negative.   Psychiatric/Behavioral: Negative.    All other systems reviewed and are negative.      Objective:   Vitals:   11/20/22 1504  BP: 132/74  Pulse: 94  Resp: 16  Temp: 97.9 F (36.6 C)  SpO2: 98%  Weight: 218 lb (98.9 kg)  Height: 6\' 3"  (1.905 m)    Body mass index is 27.25 kg/m.  Physical Exam Vitals and nursing note reviewed.  Constitutional:      General: He is not in acute distress.    Appearance: Normal appearance. He is well-developed. He is not toxic-appearing or diaphoretic.  HENT:     Head: Normocephalic and atraumatic.     Jaw: No trismus.     Right Ear: Tympanic membrane, ear canal and external ear normal. There is no impacted cerumen.     Left Ear: Tympanic membrane, ear canal and external ear normal. There is no impacted cerumen.     Nose: Mucosal edema, congestion and rhinorrhea present. Rhinorrhea is clear.     Right Sinus: Maxillary sinus tenderness and frontal sinus tenderness present.     Left Sinus: Maxillary sinus tenderness and frontal sinus tenderness present.     Mouth/Throat:     Mouth: Mucous membranes are normal. Mucous  membranes are not pale, not dry and not cyanotic.     Pharynx: Uvula midline. Posterior oropharyngeal erythema present. No oropharyngeal exudate, posterior oropharyngeal edema or uvula swelling.     Tonsils: No tonsillar exudate or tonsillar abscesses.  Eyes:     General: Lids are normal. No scleral icterus.       Right eye: No discharge.        Left eye: No discharge.     Extraocular Movements: EOM normal.     Conjunctiva/sclera: Conjunctivae normal.  Neck:     Trachea: Trachea and phonation normal. No tracheal deviation.  Cardiovascular:     Rate and Rhythm: Normal rate and regular rhythm.  Pulses: Normal pulses.          Radial pulses are 2+ on the right side and 2+ on the left side.     Heart sounds: Normal heart sounds. No murmur heard.    No friction rub. No gallop.  Pulmonary:     Effort: Pulmonary effort is normal. No tachypnea, accessory muscle usage or respiratory distress.     Breath sounds: Normal breath sounds. No stridor. No decreased breath sounds, wheezing, rhonchi or rales.  Abdominal:     General: Bowel sounds are normal. There is no distension.     Palpations: Abdomen is soft.     Tenderness: There is no abdominal tenderness.  Musculoskeletal:        General: No edema. Normal range of motion.  Skin:    General: Skin is warm, dry and intact.     Capillary Refill: Capillary refill takes less than 2 seconds.     Coloration: Skin is not pale.     Findings: No rash.     Nails: There is no clubbing.  Neurological:     Mental Status: He is alert and oriented to person, place, and time.     Motor: No abnormal muscle tone.     Coordination: Coordination normal.     Gait: Gait normal.  Psychiatric:        Mood and Affect: Mood and affect normal.        Speech: Speech normal.        Behavior: Behavior normal. Behavior is cooperative.      Results for orders placed or performed in visit on 08/19/22  POCT HgB A1C  Result Value Ref Range   Hemoglobin A1C 7.7 (A)  4.0 - 5.6 %   HbA1c POC (<> result, manual entry)     HbA1c, POC (prediabetic range)     HbA1c, POC (controlled diabetic range)         Assessment & Plan:   1. Rhinosinusitis Onset of URI/sinus sx 11 d ago, acute worsening 3 d ago with sinus ttp on exam - will cover with abx for ABS Still encouraged him to do saline sinus/nasal spray, start antihistamine and use tylenol/NSAID for discomfort  - loratadine (CLARITIN) 10 MG tablet; Take 1 tablet (10 mg total) by mouth daily.  Dispense: 30 tablet; Refill: 11  2. Acute cough A lot of post nasal drip and throat clearing He may have some improvement as illness resolves and with claritin (possibly some season allergies starting) and I expect congestion cough to gradually improve He currently has no SOB, CP tightness/wheeze SOB and lungs CTA on exam He can try these cough meds as well - benzonatate (TESSALON) 100 MG capsule; Take 1-2 capsules (100-200 mg total) by mouth 3 (three) times daily as needed for cough.  Dispense: 30 capsule; Refill: 0 - promethazine-dextromethorphan (PROMETHAZINE-DM) 6.25-15 MG/5ML syrup; Take 2.5-5 mLs by mouth 4 (four) times daily as needed for cough.  Dispense: 118 mL; Refill: 0  F/up as needed if not improving    Danelle Berry, PA-C 11/20/22 3:27 PM

## 2022-11-20 NOTE — Patient Instructions (Addendum)
Try to start a daily 2nd generation antihistamine like claritin, zyrtec, or allegra. Try saline nasal spray to help irrigate and moisturize your nasal mucosa and sinuses Start the antibiotic tonight You can try the cough medicines I've sent in. For pressure and discomfort you can try tylenol or ibuprofen.  Sinus Infection, Adult A sinus infection is soreness and swelling (inflammation) of your sinuses. Sinuses are hollow spaces in the bones around your face. They are located: Around your eyes. In the middle of your forehead. Behind your nose. In your cheekbones. Your sinuses and nasal passages are lined with a fluid called mucus. Mucus drains out of your sinuses. Swelling can trap mucus in your sinuses. This lets germs (bacteria, virus, or fungus) grow, which leads to infection. Most of the time, this condition is caused by a virus. What are the causes? Allergies. Asthma. Germs. Things that block your nose or sinuses. Growths in the nose (nasal polyps). Chemicals or irritants in the air. A fungus. This is rare. What increases the risk? Having a weak body defense system (immune system). Doing a lot of swimming or diving. Using nasal sprays too much. Smoking. What are the signs or symptoms? The main symptoms of this condition are pain and a feeling of pressure around the sinuses. Other symptoms include: Stuffy nose (congestion). This may make it hard to breathe through your nose. Runny nose (drainage). Soreness, swelling, and warmth in the sinuses. A cough that may get worse at night. Being unable to smell and taste. Mucus that collects in the throat or the back of the nose (postnasal drip). This may cause a sore throat or bad breath. Being very tired (fatigued). A fever. How is this diagnosed? Your symptoms. Your medical history. A physical exam. Tests to find out if your condition is short-term (acute) or long-term (chronic). Your doctor may: Check your nose for growths  (polyps). Check your sinuses using a tool that has a light on one end (endoscope). Check for allergies or germs. Do imaging tests, such as an MRI or CT scan. How is this treated? Treatment for this condition depends on the cause and whether it is short-term or long-term. If caused by a virus, your symptoms should go away on their own within 10 days. You may be given medicines to relieve symptoms. They include: Medicines that shrink swollen tissue in the nose. A spray that treats swelling of the nostrils. Rinses that help get rid of thick mucus in your nose (nasal saline washes). Medicines that treat allergies (antihistamines). Over-the-counter pain relievers. If caused by bacteria, your doctor may wait to see if you will get better without treatment. You may be given antibiotic medicine if you have: A very bad infection. A weak body defense system. If caused by growths in the nose, surgery may be needed. Follow these instructions at home: Medicines Take, use, or apply over-the-counter and prescription medicines only as told by your doctor. These may include nasal sprays. If you were prescribed an antibiotic medicine, take it as told by your doctor. Do not stop taking it even if you start to feel better. Hydrate and humidify  Drink enough water to keep your pee (urine) pale yellow. Use a cool mist humidifier to keep the humidity level in your home above 50%. Breathe in steam for 10-15 minutes, 3-4 times a day, or as told by your doctor. You can do this in the bathroom while a hot shower is running. Try not to spend time in cool or dry air.  Rest Rest as much as you can. Sleep with your head raised (elevated). Make sure you get enough sleep each night. General instructions  Put a warm, moist washcloth on your face 3-4 times a day, or as often as told by your doctor. Use nasal saline washes as often as told by your doctor. Wash your hands often with soap and water. If you cannot use  soap and water, use hand sanitizer. Do not smoke. Avoid being around people who are smoking (secondhand smoke). Keep all follow-up visits. Contact a doctor if: You have a fever. Your symptoms get worse. Your symptoms do not get better within 10 days. Get help right away if: You have a very bad headache. You cannot stop vomiting. You have very bad pain or swelling around your face or eyes. You have trouble seeing. You feel confused. Your neck is stiff. You have trouble breathing. These symptoms may be an emergency. Get help right away. Call 911. Do not wait to see if the symptoms will go away. Do not drive yourself to the hospital. Summary A sinus infection is swelling of your sinuses. Sinuses are hollow spaces in the bones around your face. This condition is caused by tissues in your nose that become inflamed or swollen. This traps germs. These can lead to infection. If you were prescribed an antibiotic medicine, take it as told by your doctor. Do not stop taking it even if you start to feel better. Keep all follow-up visits. This information is not intended to replace advice given to you by your health care provider. Make sure you discuss any questions you have with your health care provider. Document Revised: 12/19/2020 Document Reviewed: 12/19/2020 Elsevier Patient Education  2024 [employer].

## 2022-11-20 NOTE — Telephone Encounter (Signed)
  Chief Complaint: Sinus congestion and cough for 11 days Symptoms: above.   OTC medications not helping.   Also having post nasal drainage Frequency: For the last 11 days Pertinent Negatives: Patient denies OTC medications helping Disposition: [] ED /[] Urgent Care (no appt availability in office) / [x] Appointment(In office/virtual)/ []  [place] [facility]/ [] Home Care/ [] Refused Recommended Disposition /[] [place] [facility]/ []  Follow-up with PCP Additional Notes: No appts available with [facility] so appt made with Danelle Berry, PA-C at [facility] for today at 3:20.    Pt made aware to go to [facility] here in same building.   He verbalized understanding.

## 2022-11-20 NOTE — Telephone Encounter (Signed)
Message from [place] C sent at 11/20/2022  9:56 AM EDT  Summary: sinus discomfort / rx req   The patient shares that they are mainly experiencing a cough, sinus pressure and runny nose The patient has experienced sinus discomfort for roughly 11 days The patient would like to be prescribed medication for their sinus discomfort The patient has taken otc medication with little relief  Please contact further when possible          Call History  Contact Date/Time Type Contact Phone/Fax User  11/20/2022 09:54 AM EDT Phone (Incoming) Roma Kayser "Merlyn Albert" (Self) [phone] Judie Petit) Coley, Everette A   Reason for Disposition  [1] Sinus congestion (pressure, fullness) AND [2] present > 10 days  Answer Assessment - Initial Assessment Questions 1. LOCATION: "Where does it hurt?"      I'm having sinus congestion for 11 days now.   OTC medications not helping.   I have a cough too.   Runny nose eyes watery.   Over last 3 days my nasal passages are aching.  2. ONSET: "When did the sinus pain start?"  (e.g., hours, days)      11 days ago 3. SEVERITY: "How bad is the pain?"   (Scale 1-10; mild, moderate or severe)   - MILD (1-3): doesn't interfere with normal activities    - MODERATE (4-7): interferes with normal activities (e.g., work or school) or awakens from sleep   - SEVERE (8-10): excruciating pain and patient unable to do any normal activities        Moderate I feel drainage down the back of my throat.   4. RECURRENT SYMPTOM: "Have you ever had sinus problems before?" If Yes, ask: "When was the last time?" and "What happened that time?"      Not usually    It's been 2 yrs ago 5. NASAL CONGESTION: "Is the nose blocked?" If Yes, ask: "Can you open it or must you breathe through your mouth?"     Yes runny nose 6. NASAL DISCHARGE: "Do you have discharge from your nose?" If so ask, "What color?"     Yes 7. FEVER: "Do you have a fever?" If Yes, ask: "What is it, how was it measured, and when  did it start?"      No 8. OTHER SYMPTOMS: "Do you have any other symptoms?" (e.g., sore throat, cough, earache, difficulty breathing)     Coughing, drainage down back of my back    9. PREGNANCY: "Is there any chance you are pregnant?" "When was your last menstrual period?"     N/A  Protocols used: Sinus Pain or Congestion-A-AH

## 2022-12-03 ENCOUNTER — Encounter: Payer: Self-pay | Admitting: Family Medicine

## 2022-12-03 ENCOUNTER — Ambulatory Visit: Payer: No Typology Code available for payment source | Admitting: Family Medicine

## 2022-12-03 VITALS — BP 111/83 | HR 99 | Ht 75.0 in | Wt 220.3 lb

## 2022-12-03 DIAGNOSIS — E1169 Type 2 diabetes mellitus with other specified complication: Secondary | ICD-10-CM | POA: Diagnosis not present

## 2022-12-03 DIAGNOSIS — N1831 Chronic kidney disease, stage 3a: Secondary | ICD-10-CM | POA: Diagnosis not present

## 2022-12-03 DIAGNOSIS — I152 Hypertension secondary to endocrine disorders: Secondary | ICD-10-CM | POA: Diagnosis not present

## 2022-12-03 DIAGNOSIS — Z7984 Long term (current) use of oral hypoglycemic drugs: Secondary | ICD-10-CM

## 2022-12-03 DIAGNOSIS — N529 Male erectile dysfunction, unspecified: Secondary | ICD-10-CM

## 2022-12-03 DIAGNOSIS — E785 Hyperlipidemia, unspecified: Secondary | ICD-10-CM | POA: Diagnosis not present

## 2022-12-03 DIAGNOSIS — Z23 Encounter for immunization: Secondary | ICD-10-CM | POA: Diagnosis not present

## 2022-12-03 DIAGNOSIS — E1159 Type 2 diabetes mellitus with other circulatory complications: Secondary | ICD-10-CM

## 2022-12-03 LAB — POCT GLYCOSYLATED HEMOGLOBIN (HGB A1C): Hemoglobin A1C: 7 % — AB (ref 4.0–5.6)

## 2022-12-03 MED ORDER — TADALAFIL 20 MG PO TABS: 10.0000 mg | ORAL_TABLET | Freq: Every day | ORAL | 5 refills | Status: DC | PRN

## 2022-12-03 NOTE — Assessment & Plan Note (Signed)
Blood pressure is well-controlled on current medications. - Continue hydrochlorothiazide 25 mg daily - Continue amlodipine 10 mg daily - Continue lisinopril 40 mg daily

## 2022-12-03 NOTE — Progress Notes (Signed)
Established Patient Office Visit  Subjective   Patient ID: Timothy Douglas, male    DOB: 1970-04-20  Age: 52 y.o. MRN: [id number]  Chief Complaint  Patient presents with   Medical Management of Chronic Issues    4 month follow-up    HPI  Discussed the use of AI scribe software for clinical note transcription with the patient, who gave verbal consent to proceed.  History of Present Illness   The patient, with a history of hypertension, hyperlipidemia, and diabetes, presents for a routine follow-up. He reports a recent sinus infection, treated approximately two weeks ago, with residual symptoms of postnasal drainage and a slight cough. The severe pain previously experienced has resolved, but he still feels the sensation of mucus draining in the back of his throat. He denies any other new symptoms or concerns.  The patient's hypertension is managed with hydrochlorothiazide 25mg  daily, amlodipine 10mg  daily, and lisinopril 40mg  daily. His hyperlipidemia is controlled with atorvastatin 80mg  daily. His diabetes is managed with metformin XR 750mg  daily and Ozempic 2mg  weekly, with no reported side effects.         ROS    Objective:     BP 111/83 (BP Location: Left Arm, Patient Position: Sitting, Cuff Size: Normal)   Pulse 99   Ht 6\' 3"  (1.905 m)   Wt 220 lb 4.8 oz (99.9 kg)   SpO2 100%   BMI 27.54 kg/m    Physical Exam Vitals reviewed.  Constitutional:      General: He is not in acute distress.    Appearance: Normal appearance. He is not diaphoretic.  HENT:     Head: Normocephalic and atraumatic.  Eyes:     General: No scleral icterus.    Conjunctiva/sclera: Conjunctivae normal.  Cardiovascular:     Rate and Rhythm: Normal rate and regular rhythm.     Heart sounds: Normal heart sounds. No murmur heard. Pulmonary:     Effort: Pulmonary effort is normal. No respiratory distress.     Breath sounds: Normal breath sounds. No wheezing or rhonchi.  Musculoskeletal:      Cervical back: Neck supple.     Right lower leg: No edema.     Left lower leg: No edema.  Lymphadenopathy:     Cervical: No cervical adenopathy.  Skin:    General: Skin is warm and dry.     Findings: No rash.  Neurological:     Mental Status: He is alert and oriented to person, place, and time. Mental status is at baseline.  Psychiatric:        Mood and Affect: Mood normal.        Behavior: Behavior normal.      Results for orders placed or performed in visit on 12/03/22  POCT glycosylated hemoglobin (Hb A1C)  Result Value Ref Range   Hemoglobin A1C 7.0 (A) 4.0 - 5.6 %   HbA1c POC (<> result, manual entry)     HbA1c, POC (prediabetic range)     HbA1c, POC (controlled diabetic range)        The ASCVD Risk score (Arnett DK, et al., 2019) failed to calculate for the following reasons:   The valid total cholesterol range is 130 to 320 mg/dL    Assessment & Plan:   Problem List Items Addressed This Visit       Cardiovascular and Mediastinum   Hypertension associated with diabetes (HCC)    Blood pressure is well-controlled on current medications. - Continue hydrochlorothiazide 25 mg daily -  Continue amlodipine 10 mg daily - Continue lisinopril 40 mg daily      Relevant Medications   tadalafil (CIALIS) 20 MG tablet   Other Relevant Orders   Comprehensive metabolic panel     Endocrine   T2DM (type 2 diabetes mellitus) (HCC) - Primary    Diabetes management is well-controlled with current medications. Recent A1c improved from 7.7% to 7.0%. Discussed rotating injection sites for Ozempic to avoid irritation. - Continue metformin XR 750 mg daily - Continue Ozempic 2 mg weekly - Perform foot exam - Review recent eye exam records - ROI sent - Order kidney and liver function tests - Order A1c test      Relevant Orders   POCT glycosylated hemoglobin (Hb A1C) (Completed)   Hyperlipidemia due to type 2 diabetes mellitus (HCC)    Cholesterol management ongoing with  atorvastatin. Recheck of cholesterol levels due today. - Continue atorvastatin 80 mg daily - Order cholesterol panel      Relevant Medications   tadalafil (CIALIS) 20 MG tablet   Other Relevant Orders   Comprehensive metabolic panel   Lipid panel     Genitourinary   Stage 3a chronic kidney disease (HCC)    Chronic and stable Recheck metabolic panel Avoid nephrotoxic meds       Relevant Orders   Comprehensive metabolic panel     Other   Erectile dysfunction   Relevant Medications   tadalafil (CIALIS) 20 MG tablet   Other Visit Diagnoses     Immunization due       Relevant Orders   Flu vaccine trivalent PF, 6mos and older(Flulaval,Afluria,Fluarix,Fluzone) (Completed)   Influenza vaccine needed       Relevant Orders   Flu vaccine trivalent PF, 6mos and older(Flulaval,Afluria,Fluarix,Fluzone) (Completed)          Sinus Infection (Post-Infectious Drainage) Residual drainage and slight cough following sinus infection treated two weeks ago. Symptoms include post-nasal drip and tickling cough, indicating recovery phase. Reassured that these symptoms are normal during recovery and suggested symptomatic treatment with Flonase nasal spray. - Recommend Flonase nasal spray - Advise symptoms should resolve with time  General Health Maintenance Up to date on flu vaccination. Advised to obtain COVID-19 vaccination from [facility]. - Advise to obtain [facility]-19 vaccination from [facility]  Follow-up - Schedule six-month follow-up visit for physical exam - Send for lab work today - Send Cialis prescription to [facility].        Return in about 6 months (around 06/02/2023) for CPE.    Shirlee Latch, MD

## 2022-12-03 NOTE — Assessment & Plan Note (Signed)
Cholesterol management ongoing with atorvastatin. Recheck of cholesterol levels due today. - Continue atorvastatin 80 mg daily - Order cholesterol panel

## 2022-12-03 NOTE — Assessment & Plan Note (Signed)
Diabetes management is well-controlled with current medications. Recent A1c improved from 7.7% to 7.0%. Discussed rotating injection sites for Ozempic to avoid irritation. - Continue metformin XR 750 mg daily - Continue Ozempic 2 mg weekly - Perform foot exam - Review recent eye exam records - ROI sent - Order kidney and liver function tests - Order A1c test

## 2022-12-03 NOTE — Assessment & Plan Note (Signed)
Chronic and stable Recheck metabolic panel Avoid nephrotoxic meds  

## 2022-12-04 LAB — COMPREHENSIVE METABOLIC PANEL
ALT: 148 [IU]/L — ABNORMAL HIGH (ref 0–44)
AST: 108 [IU]/L — ABNORMAL HIGH (ref 0–40)
Albumin: 4.7 g/dL (ref 3.8–4.9)
Alkaline Phosphatase: 184 [IU]/L — ABNORMAL HIGH (ref 44–121)
BUN/Creatinine Ratio: 24 — ABNORMAL HIGH (ref 9–20)
BUN: 37 mg/dL — ABNORMAL HIGH (ref 6–24)
Bilirubin Total: 0.8 mg/dL (ref 0.0–1.2)
CO2: 19 mmol/L — ABNORMAL LOW (ref 20–29)
Calcium: 9.8 mg/dL (ref 8.7–10.2)
Chloride: 101 mmol/L (ref 96–106)
Creatinine, Ser: 1.52 mg/dL — ABNORMAL HIGH (ref 0.76–1.27)
Globulin, Total: 3 g/dL (ref 1.5–4.5)
Glucose: 116 mg/dL — ABNORMAL HIGH (ref 70–99)
Potassium: 5.1 mmol/L (ref 3.5–5.2)
Sodium: 137 mmol/L (ref 134–144)
Total Protein: 7.7 g/dL (ref 6.0–8.5)
eGFR: 54 mL/min/{1.73_m2} — ABNORMAL LOW (ref >59)

## 2022-12-04 LAB — LIPID PANEL
Chol/HDL Ratio: 2.7 ratio (ref 0.0–5.0)
Cholesterol, Total: 113 mg/dL (ref 100–199)
HDL: 42 mg/dL (ref >39)
LDL Chol Calc (NIH): 59 mg/dL (ref 0–99)
Triglycerides: 48 mg/dL (ref 0–149)
VLDL Cholesterol Cal: 12 mg/dL (ref 5–40)

## 2022-12-17 ENCOUNTER — Other Ambulatory Visit: Payer: Self-pay | Admitting: Family Medicine

## 2022-12-18 NOTE — Telephone Encounter (Signed)
Requested medication (s) are due for refill today: Yes  Requested medication (s) are on the active medication list: Yes  Last refill:  11/14/22 #90, 0RF  Future visit scheduled: Yes  Notes to clinic:  Manual review required     Requested Prescriptions  Pending Prescriptions Disp Refills   indomethacin (INDOCIN) 25 MG capsule [Pharmacy Med Name: INDOMETHACIN 25 MG CAPSULE] 90 capsule 0    Sig: TAKE 1 CAPSULE BY MOUTH THREE TIMES A DAY AS NEEDED     Analgesics:  NSAIDS Failed - 12/17/2022  1:34 AM      Failed - Manual Review: Labs are only required if the patient has taken medication for more than 8 weeks.      Failed - Cr in normal range and within 360 days    Creatinine, Ser  Date Value Ref Range Status  12/03/2022 1.52 (H) 0.76 - 1.27 mg/dL Final         Passed - HGB in normal range and within 360 days    Hemoglobin  Date Value Ref Range Status  05/17/2022 15.8 13.0 - 17.7 g/dL Final         Passed - PLT in normal range and within 360 days    Platelets  Date Value Ref Range Status  05/17/2022 208 150 - 450 x10E3/uL Final         Passed - HCT in normal range and within 360 days    Hematocrit  Date Value Ref Range Status  05/17/2022 46.4 37.5 - 51.0 % Final         Passed - eGFR is 30 or above and within 360 days    GFR calc Af Amer  Date Value Ref Range Status  03/02/2020 38 (L) >59 mL/min/1.73 Final    Comment:    **In accordance with recommendations from the NKF-ASN Task force,**   [facility] is in the process of updating its eGFR calculation to the   2021 CKD-EPI creatinine equation that estimates kidney function   without a race variable.    GFR, Estimated  Date Value Ref Range Status  03/09/2020 42 (L) >60 mL/min Final    Comment:    (NOTE) Calculated using the CKD-EPI Creatinine Equation (2021)    eGFR  Date Value Ref Range Status  12/03/2022 54 (L) >59 mL/min/1.73 Final         Passed - Patient is not pregnant      Passed - Valid encounter  within last 12 months    Recent Outpatient Visits           2 weeks ago Type 2 diabetes mellitus with other specified complication, without long-term current use of insulin (HCC)   [place] [facility], Marzella Schlein, MD   4 weeks ago Rhinosinusitis   [facility] Danelle Berry, PA-C   4 months ago Type 2 diabetes mellitus with other specified complication, without long-term current use of insulin [facility])   [facility] [place], Marzella Schlein, MD   7 months ago Encounter for annual physical exam   [place] [facility] [place], Marzella Schlein, MD   10 months ago Type 2 diabetes mellitus with other specified complication, without long-term current use of insulin [facility])   [place] [facility] Bacigalupo, Marzella Schlein, MD       Future Appointments             In 5 months Bacigalupo, Marzella Schlein, MD [facility]  Family Practice, [facility]

## 2023-01-26 ENCOUNTER — Other Ambulatory Visit: Payer: Self-pay | Admitting: Family Medicine

## 2023-01-26 DIAGNOSIS — E1169 Type 2 diabetes mellitus with other specified complication: Secondary | ICD-10-CM

## 2023-01-27 ENCOUNTER — Other Ambulatory Visit: Payer: Self-pay | Admitting: Family Medicine

## 2023-01-27 DIAGNOSIS — I152 Hypertension secondary to endocrine disorders: Secondary | ICD-10-CM

## 2023-02-03 ENCOUNTER — Other Ambulatory Visit: Payer: Self-pay | Admitting: Family Medicine

## 2023-02-03 DIAGNOSIS — N1831 Chronic kidney disease, stage 3a: Secondary | ICD-10-CM

## 2023-02-03 DIAGNOSIS — I152 Hypertension secondary to endocrine disorders: Secondary | ICD-10-CM

## 2023-02-05 ENCOUNTER — Telehealth: Payer: Self-pay

## 2023-02-05 NOTE — Telephone Encounter (Signed)
 Copied from CRM [id number]. Topic: General - Other >> Feb 05, 2023  3:56 PM [place] B wrote: Reason for CRM: pt called in states in the future,  all of his refill of meds, need to be 90 day supply or insurance won't cover them

## 2023-02-05 NOTE — Telephone Encounter (Signed)
 Requested by interface surescripts. Future visit in 3 months.  Requested Prescriptions  Pending Prescriptions Disp Refills   lisinopril  (ZESTRIL ) 40 MG tablet [Pharmacy Med Name: LISINOPRIL  40 MG TABLET] 90 tablet 0    Sig: TAKE 1 TABLET BY MOUTH EVERY DAY     Cardiovascular:  ACE Inhibitors Failed - 02/05/2023  4:03 PM      Failed - Cr in normal range and within 180 days    Creatinine, Ser  Date Value Ref Range Status  12/03/2022 1.52 (H) 0.76 - 1.27 mg/dL Final         Passed - K in normal range and within 180 days    Potassium  Date Value Ref Range Status  12/03/2022 5.1 3.5 - 5.2 mmol/L Final         Passed - Patient is not pregnant      Passed - Last BP in normal range    BP Readings from Last 1 Encounters:  12/03/22 111/83         Passed - Valid encounter within last 6 months    Recent Outpatient Visits           2 months ago Type 2 diabetes mellitus with other specified complication, without long-term current use of insulin (HCC)   [place] [facility] [place], Jon HERO, MD   2 months ago Rhinosinusitis   [facility] Leavy Mole, PA-C   5 months ago Type 2 diabetes mellitus with other specified complication, without long-term current use of insulin (HCC)   [place] [facility] [place], Jon HERO, MD   8 months ago Encounter for annual physical exam   [place] [facility] [place], Jon HERO, MD   11 months ago Type 2 diabetes mellitus with other specified complication, without long-term current use of insulin (HCC)   [place] [facility] [place], Jon HERO, MD       Future Appointments             In 3 months Bacigalupo, Jon HERO, MD [facility], [facility]            Refused Prescriptions Disp Refills   OZEMPIC , 1 MG/DOSE, 4 MG/3ML SOPN [Pharmacy Med Name: OZEMPIC  4 MG/3 ML (1 MG/DOSE)]  1    Sig: INJECT 1 MG ONCE A WEEK AS  DIRECTED     Endocrinology:  Diabetes - GLP-1 Receptor Agonists - semaglutide  Failed - 02/05/2023  4:03 PM      Failed - HBA1C in normal range and within 180 days    Hemoglobin A1C  Date Value Ref Range Status  12/03/2022 7.0 (A) 4.0 - 5.6 % Final  11/28/2018 7.7  Final   Hgb A1c MFr Bld  Date Value Ref Range Status  05/17/2022 6.7 (H) 4.8 - 5.6 % Final    Comment:             Prediabetes: 5.7 - 6.4          Diabetes: >6.4          Glycemic control for adults with diabetes: <7.0          Failed - Cr in normal range and within 360 days    Creatinine, Ser  Date Value Ref Range Status  12/03/2022 1.52 (H) 0.76 - 1.27 mg/dL Final         Passed - Valid encounter within last 6 months    Recent Outpatient Visits  2 months ago Type 2 diabetes mellitus with other specified complication, without long-term current use of insulin [facility])   [place] [facility] [place], Jon HERO, MD   2 months ago Rhinosinusitis   [facility] Leavy Mole, PA-C   5 months ago Type 2 diabetes mellitus with other specified complication, without long-term current use of insulin [place] [facility])   [place] [facility] [place], Jon HERO, MD   8 months ago Encounter for annual physical exam   [place] [facility] [place], Jon HERO, MD   11 months ago Type 2 diabetes mellitus with other specified complication, without long-term current use of insulin [facility])   [place] [facility] Bacigal[place], Jon HERO, MD       Future Appointments             In 3 months Bacigalupo, Jon HERO, MD [facility], [facility]

## 2023-02-05 NOTE — Telephone Encounter (Signed)
 Requested by interface surescripts. Medication dose discontinued 05/23/22.  Requested Prescriptions  Pending Prescriptions Disp Refills   lisinopril  (ZESTRIL ) 40 MG tablet [Pharmacy Med Name: LISINOPRIL  40 MG TABLET] 90 tablet 1    Sig: TAKE 1 TABLET BY MOUTH EVERY DAY     Cardiovascular:  ACE Inhibitors Failed - 02/05/2023  4:01 PM      Failed - Cr in normal range and within 180 days    Creatinine, Ser  Date Value Ref Range Status  12/03/2022 1.52 (H) 0.76 - 1.27 mg/dL Final         Passed - K in normal range and within 180 days    Potassium  Date Value Ref Range Status  12/03/2022 5.1 3.5 - 5.2 mmol/L Final         Passed - Patient is not pregnant      Passed - Last BP in normal range    BP Readings from Last 1 Encounters:  12/03/22 111/83         Passed - Valid encounter within last 6 months    Recent Outpatient Visits           2 months ago Type 2 diabetes mellitus with other specified complication, without long-term current use of insulin (HCC)   [facility] [place], Jon HERO, MD   2 months ago Rhinosinusitis   [facility] Leavy Mole, PA-C   5 months ago Type 2 diabetes mellitus with other specified complication, without long-term current use of insulin (HCC)   [place] [facility] [place], Jon HERO, MD   8 months ago Encounter for annual physical exam   [place] [facility] [place], Jon HERO, MD   11 months ago Type 2 diabetes mellitus with other specified complication, without long-term current use of insulin (HCC)   [place] [facility] [place], Jon HERO, MD       Future Appointments             In 3 months Bacigalupo, Jon HERO, MD [facility], [facility]            Refused Prescriptions Disp Refills   OZEMPIC , 1 MG/DOSE, 4 MG/3ML SOPN [Pharmacy Med Name: OZEMPIC  4 MG/3 ML (1 MG/DOSE)]  1    Sig: INJECT 1 MG ONCE A  WEEK AS DIRECTED     Endocrinology:  Diabetes - GLP-1 Receptor Agonists - semaglutide  Failed - 02/05/2023  4:01 PM      Failed - HBA1C in normal range and within 180 days    Hemoglobin A1C  Date Value Ref Range Status  12/03/2022 7.0 (A) 4.0 - 5.6 % Final  11/28/2018 7.7  Final   Hgb A1c MFr Bld  Date Value Ref Range Status  05/17/2022 6.7 (H) 4.8 - 5.6 % Final    Comment:             Prediabetes: 5.7 - 6.4          Diabetes: >6.4          Glycemic control for adults with diabetes: <7.0          Failed - Cr in normal range and within 360 days    Creatinine, Ser  Date Value Ref Range Status  12/03/2022 1.52 (H) 0.76 - 1.27 mg/dL Final         Passed - Valid encounter within last 6 months    Recent Outpatient Visits  2 months ago Type 2 diabetes mellitus with other specified complication, without long-term current use of insulin [facility])   [facility] [place], Jon HERO, MD   2 months ago Rhinosinusitis   [facility] Leavy Mole, PA-C   5 months ago Type 2 diabetes mellitus with other specified complication, without long-term current use of insulin [facility])   [place] [facility] [place], Jon HERO, MD   8 months ago Encounter for annual physical exam   [place] [place], Jon HERO, MD   11 months ago Type 2 diabetes mellitus with other specified complication, without long-term current use of insulin [facility])    [facility] Bacigalupo, Jon HERO, MD       Future Appointments             In 3 months Bacigalupo, Jon HERO, MD [facility], [facility]

## 2023-03-03 ENCOUNTER — Other Ambulatory Visit: Payer: Self-pay | Admitting: Family Medicine

## 2023-03-04 NOTE — Telephone Encounter (Signed)
 Requested medication (s) are due for refill today - yes  Requested medication (s) are on the active medication list -yes  Future visit scheduled -yes  Last refill: 12/20/22 #90  Notes to clinic: patient may be overdue lab repeat- sent for review   Requested Prescriptions  Pending Prescriptions Disp Refills   indomethacin  (INDOCIN ) 25 MG capsule [Pharmacy Med Name: INDOMETHACIN  25 MG CAPSULE] 90 capsule 0    Sig: TAKE 1 CAPSULE BY MOUTH THREE TIMES A DAY AS NEEDED     Analgesics:  NSAIDS Failed - 03/04/2023  9:31 AM      Failed - Manual Review: Labs are only required if the patient has taken medication for more than 8 weeks.      Failed - Cr in normal range and within 360 days    Creatinine, Ser  Date Value Ref Range Status  12/03/2022 1.52 (H) 0.76 - 1.27 mg/dL Final         Passed - HGB in normal range and within 360 days    Hemoglobin  Date Value Ref Range Status  05/17/2022 15.8 13.0 - 17.7 g/dL Final         Passed - PLT in normal range and within 360 days    Platelets  Date Value Ref Range Status  05/17/2022 208 150 - 450 x10E3/uL Final         Passed - HCT in normal range and within 360 days    Hematocrit  Date Value Ref Range Status  05/17/2022 46.4 37.5 - 51.0 % Final         Passed - eGFR is 30 or above and within 360 days    GFR calc Af Amer  Date Value Ref Range Status  03/02/2020 38 (L) >59 mL/min/1.73 Final    Comment:    **In accordance with recommendations from the NKF-ASN Task force,**   [facility] is in the process of updating its eGFR calculation to the   2021 CKD-EPI creatinine equation that estimates kidney function   without a race variable.    GFR, Estimated  Date Value Ref Range Status  03/09/2020 42 (L) >60 mL/min Final    Comment:    (NOTE) Calculated using the CKD-EPI Creatinine Equation (2021)    eGFR  Date Value Ref Range Status  12/03/2022 54 (L) >59 mL/min/1.73 Final         Passed - Patient is not pregnant      Passed -  Valid encounter within last 12 months    Recent Outpatient Visits           3 months ago Type 2 diabetes mellitus with other specified complication, without long-term current use of insulin (HCC)   [place] [place], Jon HERO, MD   3 months ago Rhinosinusitis   [facility] Leavy Mole, PA-C   6 months ago Type 2 diabetes mellitus with other specified complication, without long-term current use of insulin [facility])   [place] [facility] [place], Jon HERO, MD   9 months ago Encounter for annual physical exam   [place] [place], Jon HERO, MD   1 year ago Type 2 diabetes mellitus with other specified complication, without long-term current use of insulin [facility])   [place] [facility] Bacigalupo, Jon HERO, MD       Future Appointments             In 3 months Bacigalupo, Jon HERO, MD [facility]               Requested Prescriptions  Pending Prescriptions Disp Refills   indomethacin  (INDOCIN ) 25 MG capsule [Pharmacy Med Name: INDOMETHACIN  25 MG CAPSULE] 90 capsule 0    Sig: TAKE 1 CAPSULE BY MOUTH THREE TIMES A DAY AS NEEDED     Analgesics:  NSAIDS Failed - 03/04/2023  9:31 AM      Failed - Manual Review: Labs are only required if the patient has taken medication for more than 8 weeks.      Failed - Cr in normal range and within 360 days    Creatinine, Ser  Date Value Ref Range Status  12/03/2022 1.52 (H) 0.76 - 1.27 mg/dL Final         Passed - HGB in normal range and within 360 days    Hemoglobin  Date Value Ref Range Status  05/17/2022 15.8 13.0 - 17.7 g/dL Final         Passed - PLT in normal range and within 360 days    Platelets  Date Value Ref Range Status  05/17/2022 208 150 - 450 x10E3/uL Final         Passed - HCT in normal range and within 360 days    Hematocrit  Date Value Ref Range Status   05/17/2022 46.4 37.5 - 51.0 % Final         Passed - eGFR is 30 or above and within 360 days    GFR calc Af Amer  Date Value Ref Range Status  03/02/2020 38 (L) >59 mL/min/1.73 Final    Comment:    **In accordance with recommendations from the NKF-ASN Task force,**   [facility] is in the process of updating its eGFR calculation to the   2021 CKD-EPI creatinine equation that estimates kidney function   without a race variable.    GFR, Estimated  Date Value Ref Range Status  03/09/2020 42 (L) >60 mL/min Final    Comment:    (NOTE) Calculated using the CKD-EPI Creatinine Equation (2021)    eGFR  Date Value Ref Range Status  12/03/2022 54 (L) >59 mL/min/1.73 Final         Passed - Patient is not pregnant      Passed - Valid encounter within last 12 months    Recent Outpatient Visits           3 months ago Type 2 diabetes mellitus with other specified complication, without long-term current use of insulin (HCC)   [place] [facility] [place], Jon HERO, MD   3 months ago Rhinosinusitis   [facility] Leavy Mole, PA-C   6 months ago Type 2 diabetes mellitus with other specified complication, without long-term current use of insulin [facility])   [place] [facility] [place], Jon HERO, MD   9 months ago Encounter for annual physical exam   [place] [facility] [place], Jon HERO, MD   1 year ago Type 2 diabetes mellitus with other specified complication, without long-term current use of insulin [facility])   [place] [facility] [place], Jon HERO, MD       Future Appointments             In 3 months Bacigalupo, Jon HERO, MD [facility], [facility]

## 2023-03-25 ENCOUNTER — Other Ambulatory Visit: Payer: Self-pay | Admitting: Family Medicine

## 2023-03-25 DIAGNOSIS — I10 Essential (primary) hypertension: Secondary | ICD-10-CM

## 2023-03-26 NOTE — Telephone Encounter (Signed)
 Requested Prescriptions  Pending Prescriptions Disp Refills   OZEMPIC, 1 MG/DOSE, 4 MG/3ML SOPN [Pharmacy Med Name: OZEMPIC 4 MG/3 ML (1 MG/DOSE)]  1    Sig: INJECT 1 MG ONCE A WEEK AS DIRECTED     Endocrinology:  Diabetes - GLP-1 Receptor Agonists - semaglutide Failed - 03/26/2023  1:51 PM      Failed - HBA1C in normal range and within 180 days    Hemoglobin A1C  Date Value Ref Range Status  12/03/2022 7.0 (A) 4.0 - 5.6 % Final  11/28/2018 7.7  Final   Hgb A1c MFr Bld  Date Value Ref Range Status  05/17/2022 6.7 (H) 4.8 - 5.6 % Final    Comment:             Prediabetes: 5.7 - 6.4          Diabetes: >6.4          Glycemic control for adults with diabetes: <7.0          Failed - Cr in normal range and within 360 days    Creatinine, Ser  Date Value Ref Range Status  12/03/2022 1.52 (H) 0.76 - 1.27 mg/dL Final         Passed - Valid encounter within last 6 months    Recent Outpatient Visits           3 months ago Type 2 diabetes mellitus with other specified complication, without long-term current use of insulin (HCC)   [facility] [place], Marzella Schlein, MD   4 months ago Rhinosinusitis   [facility] Danelle Berry, PA-C   7 months ago Type 2 diabetes mellitus with other specified complication, without long-term current use of insulin [facility])   [place] [facility] [place], Marzella Schlein, MD   10 months ago Encounter for annual physical exam   [place] [facility] [place], Marzella Schlein, MD   1 year ago Type 2 diabetes mellitus with other specified complication, without long-term current use of insulin [facility])   [facility] [place], Marzella Schlein, MD       Future Appointments             In 2 months Bacigalupo, Marzella Schlein, MD [facility]             amLODipine (NORVASC) 10 MG tablet [Pharmacy Med Name: AMLODIPINE BESYLATE 10 MG TAB]  90 tablet 1    Sig: TAKE 1 TABLET BY MOUTH EVERY DAY     Cardiovascular: Calcium Channel Blockers 2 Passed - 03/26/2023  1:51 PM      Passed - Last BP in normal range    BP Readings from Last 1 Encounters:  12/03/22 111/83         Passed - Last Heart Rate in normal range    Pulse Readings from Last 1 Encounters:  12/03/22 99         Passed - Valid encounter within last 6 months    Recent Outpatient Visits           3 months ago Type 2 diabetes mellitus with other specified complication, without long-term current use of insulin [facility])   [place] [facility] [place], Marzella Schlein, MD   4 months ago Rhinosinusitis   [facility] Danelle Berry, PA-C   7 months ago Type 2 diabetes mellitus with other specified complication, without long-term current use of insulin (  [facility])   [place] [facility] [place], Marzella Schlein, MD   10 months ago Encounter for annual physical exam   [place] [place], Marzella Schlein, MD   1 year ago Type 2 diabetes mellitus with other specified complication, without long-term current use of insulin [facility])   [place] [facility] [place], Marzella Schlein, MD       Future Appointments             In 2 months Bacigalupo, Marzella Schlein, MD [facility], [facility]             Semaglutide, 2 MG/DOSE, (OZEMPIC, 2 MG/DOSE,) 8 MG/3ML SOPN [Pharmacy Med Name: OZEMPIC 8 MG/3 ML (2 MG/DOSE)] 9 mL 1    Sig: INJECT 2 MG INTO THE SKIN ONCE A WEEK.     Endocrinology:  Diabetes - GLP-1 Receptor Agonists - semaglutide Failed - 03/26/2023  1:51 PM      Failed - HBA1C in normal range and within 180 days    Hemoglobin A1C  Date Value Ref Range Status  12/03/2022 7.0 (A) 4.0 - 5.6 % Final  11/28/2018 7.7  Final   Hgb A1c MFr Bld  Date Value Ref Range Status  05/17/2022 6.7 (H) 4.8 - 5.6 % Final    Comment:             Prediabetes: 5.7 - 6.4           Diabetes: >6.4          Glycemic control for adults with diabetes: <7.0          Failed - Cr in normal range and within 360 days    Creatinine, Ser  Date Value Ref Range Status  12/03/2022 1.52 (H) 0.76 - 1.27 mg/dL Final         Passed - Valid encounter within last 6 months    Recent Outpatient Visits           3 months ago Type 2 diabetes mellitus with other specified complication, without long-term current use of insulin (HCC)   [place] [facility] [place], Marzella Schlein, MD   4 months ago Rhinosinusitis   [facility] Danelle Berry, PA-C   7 months ago Type 2 diabetes mellitus with other specified complication, without long-term current use of insulin [facility])   [place] [facility] [place], Marzella Schlein, MD   10 months ago Encounter for annual physical exam   [place] [facility] [place] [place], Marzella Schlein, MD   1 year ago Type 2 diabetes mellitus with other specified complication, without long-term current use of insulin [facility])   [place] [facility] [place], Marzella Schlein, MD       Future Appointments             In 2 months Bacigalupo, Marzella Schlein, MD [facility], [facility]

## 2023-03-26 NOTE — Telephone Encounter (Signed)
 D/C/ 05/23/22. Requested Prescriptions  Signed Prescriptions Disp Refills   amLODipine (NORVASC) 10 MG tablet 90 tablet 1    Sig: TAKE 1 TABLET BY MOUTH EVERY DAY     Cardiovascular: Calcium Channel Blockers 2 Passed - 03/26/2023  1:52 PM      Passed - Last BP in normal range    BP Readings from Last 1 Encounters:  12/03/22 111/83         Passed - Last Heart Rate in normal range    Pulse Readings from Last 1 Encounters:  12/03/22 99         Passed - Valid encounter within last 6 months    Recent Outpatient Visits           3 months ago Type 2 diabetes mellitus with other specified complication, without long-term current use of insulin (HCC)   [place] [facility] [place], Marzella Schlein, MD   4 months ago Rhinosinusitis   [facility] Danelle Berry, PA-C   7 months ago Type 2 diabetes mellitus with other specified complication, without long-term current use of insulin [facility])   [place] [facility] [place], Marzella Schlein, MD   10 months ago Encounter for annual physical exam   [place] [facility] [place], Marzella Schlein, MD   1 year ago Type 2 diabetes mellitus with other specified complication, without long-term current use of insulin [facility])   [place] [facility] [place], Marzella Schlein, MD       Future Appointments             In 2 months Bacigalupo, Marzella Schlein, MD [facility], [facility]             Semaglutide, 2 MG/DOSE, (OZEMPIC, 2 MG/DOSE,) 8 MG/3ML SOPN 9 mL 1    Sig: INJECT 2 MG INTO THE SKIN ONCE A WEEK.     Endocrinology:  Diabetes - GLP-1 Receptor Agonists - semaglutide Failed - 03/26/2023  1:52 PM      Failed - HBA1C in normal range and within 180 days    Hemoglobin A1C  Date Value Ref Range Status  12/03/2022 7.0 (A) 4.0 - 5.6 % Final  11/28/2018 7.7  Final   Hgb A1c MFr Bld  Date Value Ref Range Status  05/17/2022 6.7 (H) 4.8 - 5.6 % Final     Comment:             Prediabetes: 5.7 - 6.4          Diabetes: >6.4          Glycemic control for adults with diabetes: <7.0          Failed - Cr in normal range and within 360 days    Creatinine, Ser  Date Value Ref Range Status  12/03/2022 1.52 (H) 0.76 - 1.27 mg/dL Final         Passed - Valid encounter within last 6 months    Recent Outpatient Visits           3 months ago Type 2 diabetes mellitus with other specified complication, without long-term current use of insulin [facility])   [place] [facility] [place], Marzella Schlein, MD   4 months ago Rhinosinusitis   [facility] Danelle Berry, PA-C   7 months ago Type 2 diabetes mellitus with other specified complication, without long-term current use of insulin [facility])    [facility] [place], [place]  M, MD   10 months ago Encounter for annual physical exam   [place] [facility] [place], Marzella Schlein, MD   1 year ago Type 2 diabetes mellitus with other specified complication, without long-term current use of insulin [facility])   [place] [facility] [place], Marzella Schlein, MD       Future Appointments             In 2 months Bacigalupo, Marzella Schlein, MD [facility], [facility]            Refused Prescriptions Disp Refills   OZEMPIC, 1 MG/DOSE, 4 MG/3ML SOPN [Pharmacy Med Name: OZEMPIC 4 MG/3 ML (1 MG/DOSE)]  1    Sig: INJECT 1 MG ONCE A WEEK AS DIRECTED     Endocrinology:  Diabetes - GLP-1 Receptor Agonists - semaglutide Failed - 03/26/2023  1:52 PM      Failed - HBA1C in normal range and within 180 days    Hemoglobin A1C  Date Value Ref Range Status  12/03/2022 7.0 (A) 4.0 - 5.6 % Final  11/28/2018 7.7  Final   Hgb A1c MFr Bld  Date Value Ref Range Status  05/17/2022 6.7 (H) 4.8 - 5.6 % Final    Comment:             Prediabetes: 5.7 - 6.4          Diabetes: >6.4          Glycemic control for adults with  diabetes: <7.0          Failed - Cr in normal range and within 360 days    Creatinine, Ser  Date Value Ref Range Status  12/03/2022 1.52 (H) 0.76 - 1.27 mg/dL Final         Passed - Valid encounter within last 6 months    Recent Outpatient Visits           3 months ago Type 2 diabetes mellitus with other specified complication, without long-term current use of insulin (HCC)   [place] [facility] [place], Marzella Schlein, MD   4 months ago Rhinosinusitis   [facility] Danelle Berry, PA-C   7 months ago Type 2 diabetes mellitus with other specified complication, without long-term current use of insulin [facility])   [place] [facility] [place], Marzella Schlein, MD   10 months ago Encounter for annual physical exam   [place] [facility] [place], Marzella Schlein, MD   1 year ago Type 2 diabetes mellitus with other specified complication, without long-term current use of insulin [facility])   [place] [facility] [place], Marzella Schlein, MD       Future Appointments             In 2 months Bacigalupo, Marzella Schlein, MD [facility], [facility]

## 2023-04-23 ENCOUNTER — Other Ambulatory Visit: Payer: Self-pay | Admitting: Family Medicine

## 2023-04-23 DIAGNOSIS — E1169 Type 2 diabetes mellitus with other specified complication: Secondary | ICD-10-CM

## 2023-04-24 NOTE — Telephone Encounter (Signed)
 Requested Prescriptions  Pending Prescriptions Disp Refills   atorvastatin (LIPITOR) 80 MG tablet [Pharmacy Med Name: ATORVASTATIN 80 MG TABLET] 90 tablet 0    Sig: TAKE 1 TABLET BY MOUTH EVERY DAY     Cardiovascular:  Antilipid - Statins Failed - 04/24/2023 12:28 PM      Failed - Valid encounter within last 12 months    Recent Outpatient Visits   None     Future Appointments             In 1 month Bacigalupo, Marzella Schlein, MD [facility], [facility]            Failed - Lipid Panel in normal range within the last 12 months    Cholesterol, Total  Date Value Ref Range Status  12/03/2022 113 100 - 199 mg/dL Final   LDL Chol Calc (NIH)  Date Value Ref Range Status  12/03/2022 59 0 - 99 mg/dL Final   HDL  Date Value Ref Range Status  12/03/2022 42 >39 mg/dL Final   Triglycerides  Date Value Ref Range Status  12/03/2022 48 0 - 149 mg/dL Final         Passed - Patient is not pregnant

## 2023-04-25 ENCOUNTER — Other Ambulatory Visit: Payer: Self-pay | Admitting: Family Medicine

## 2023-04-25 DIAGNOSIS — M109 Gout, unspecified: Secondary | ICD-10-CM

## 2023-04-25 NOTE — Telephone Encounter (Signed)
 Copied from CRM [id number]. Topic: Clinical - Medication Refill >> Apr 25, 2023 10:17 AM Patsy Lager T wrote: Most Recent Primary Care Visit:  Provider: Erasmo Downer  Department[id number]  Visit Type: OFFICE VISIT  Date: 12/03/2022  Medication: indomethacin (INDOCIN) 25 MG capsule  Has the patient contacted their pharmacy? No  Is this the correct pharmacy for this prescription? Yes If no, delete pharmacy and type the correct one.  This is the patient's preferred pharmacy:  [id number] [place], [facility] - [address] [place] [address] Phone: [phone] Fax: [phone]  [id number] [place], [place] - [address] [place] [address] Phone: [phone] Fax: [phone]  [facility] [address] [facility] - [place], [place] - [address] [facility] [address] [facility] [address] [place] [address] Phone: [phone] Fax: [phone]  [id number] [place], [place] - [address] [address] [place] [address] Phone: [phone] Fax: [phone]   Has the prescription been filled recently? Yes  Is the patient out of the medication? Yes  Has the patient been seen for an appointment in the last year OR does the patient have an upcoming appointment? Yes  Can we respond through MyChart? No  Agent: Please be advised that Rx refills may take up to 3 business days. We ask that you follow-up with your pharmacy.

## 2023-05-25 ENCOUNTER — Other Ambulatory Visit: Payer: Self-pay | Admitting: Physician Assistant

## 2023-05-25 DIAGNOSIS — M109 Gout, unspecified: Secondary | ICD-10-CM

## 2023-05-27 NOTE — Telephone Encounter (Signed)
 Requested medication (s) are due for refill today: Yes  Requested medication (s) are on the active medication list: Yes  Last refill:  04/28/23  Future visit scheduled: Yes  Notes to clinic:  Manual review.    Requested Prescriptions  Pending Prescriptions Disp Refills   indomethacin  (INDOCIN ) 25 MG capsule [Pharmacy Med Name: INDOMETHACIN  25 MG CAPSULE] 90 capsule 0    Sig: TAKE 1 CAPSULE BY MOUTH THREE TIMES A DAY AS NEEDED     Analgesics:  NSAIDS Failed - 05/27/2023 10:13 AM      Failed - Manual Review: Labs are only required if the patient has taken medication for more than 8 weeks.      Failed - Cr in normal range and within 360 days    Creatinine, Ser  Date Value Ref Range Status  12/03/2022 1.52 (H) 0.76 - 1.27 mg/dL Final         Failed - HGB in normal range and within 360 days    Hemoglobin  Date Value Ref Range Status  05/17/2022 15.8 13.0 - 17.7 g/dL Final         Failed - PLT in normal range and within 360 days    Platelets  Date Value Ref Range Status  05/17/2022 208 150 - 450 x10E3/uL Final         Failed - HCT in normal range and within 360 days    Hematocrit  Date Value Ref Range Status  05/17/2022 46.4 37.5 - 51.0 % Final         Failed - Valid encounter within last 12 months    Recent Outpatient Visits   None     Future Appointments             In 1 week Bacigalupo, Stan Eans, MD [facility]            Passed - eGFR is 30 or above and within 360 days    GFR calc Af Zara Heymann  Date Value Ref Range Status  03/02/2020 38 (L) >59 mL/min/1.73 Final    Comment:    **In accordance with recommendations from the NKF-ASN Task force,**   [facility] is in the process of updating its eGFR calculation to the   2021 CKD-EPI creatinine equation that estimates kidney function   without a race variable.    GFR, Estimated  Date Value Ref Range Status  03/09/2020 42 (L) >60 mL/min Final    Comment:    (NOTE) Calculated  using the CKD-EPI Creatinine Equation (2021)    eGFR  Date Value Ref Range Status  12/03/2022 54 (L) >59 mL/min/1.73 Final         Passed - Patient is not pregnant

## 2023-06-03 ENCOUNTER — Ambulatory Visit (INDEPENDENT_AMBULATORY_CARE_PROVIDER_SITE_OTHER): Payer: Self-pay | Admitting: Family Medicine

## 2023-06-03 VITALS — BP 130/80 | HR 78 | Ht 75.0 in | Wt 211.4 lb

## 2023-06-03 DIAGNOSIS — R6882 Decreased libido: Secondary | ICD-10-CM

## 2023-06-03 DIAGNOSIS — Z125 Encounter for screening for malignant neoplasm of prostate: Secondary | ICD-10-CM

## 2023-06-03 DIAGNOSIS — E1169 Type 2 diabetes mellitus with other specified complication: Secondary | ICD-10-CM | POA: Diagnosis not present

## 2023-06-03 DIAGNOSIS — N1831 Chronic kidney disease, stage 3a: Secondary | ICD-10-CM | POA: Diagnosis not present

## 2023-06-03 DIAGNOSIS — I152 Hypertension secondary to endocrine disorders: Secondary | ICD-10-CM | POA: Diagnosis not present

## 2023-06-03 DIAGNOSIS — Z0001 Encounter for general adult medical examination with abnormal findings: Secondary | ICD-10-CM | POA: Diagnosis not present

## 2023-06-03 DIAGNOSIS — Z23 Encounter for immunization: Secondary | ICD-10-CM

## 2023-06-03 DIAGNOSIS — E785 Hyperlipidemia, unspecified: Secondary | ICD-10-CM

## 2023-06-03 DIAGNOSIS — N5201 Erectile dysfunction due to arterial insufficiency: Secondary | ICD-10-CM | POA: Diagnosis not present

## 2023-06-03 DIAGNOSIS — Z0000 Encounter for general adult medical examination without abnormal findings: Secondary | ICD-10-CM

## 2023-06-03 DIAGNOSIS — E1159 Type 2 diabetes mellitus with other circulatory complications: Secondary | ICD-10-CM

## 2023-06-03 NOTE — Assessment & Plan Note (Signed)
 He reports decreased libido and erectile changes, possibly related to Metformin  or Ozempic . Discussion on potential medication adjustments to address these issues. Options include switching from Ozempic  to Mounjaro or adjusting Cialis  usage. - Continue Cialis  as needed - consider daily dosing if persists - will d/c metformin  pending A1c - Consider switching from Ozempic  to Mounjaro if issues persist - Reassess erectile function in 3 months

## 2023-06-03 NOTE — Assessment & Plan Note (Signed)
 Hyperlipidemia is managed with atorvastatin . recheck Lipids today

## 2023-06-03 NOTE — Progress Notes (Signed)
 Complete physical exam   Patient: Timothy Douglas   DOB: [birth year]   53 y.o. Male  MRN: [id number] Visit Date: 06/03/2023  Today's healthcare provider: Aden Agreste, MD   Chief Complaint  Patient presents with   Annual Exam    Last completed 05/17/22 Diet -  General, well balanced Exercise - strenuous job Feeling - well Sleeping - well Concerns -  wanting to know if he can reduce or come off of his medications   Care Management    Pneumococcal Vaccine - yes    Subjective    Timothy Douglas is a 53 y.o. male who presents today for a complete physical exam.    Discussed the use of AI scribe software for clinical note transcription with the patient, who gave verbal consent to proceed.  History of Present Illness   Timothy Oathout "Aron Lard" is a 53 year old male who presents for an annual physical exam.  He has no current complaints and is due for a pneumonia shot. He inquires about the need for a COVID booster, with his last COVID vaccine administered approximately two years ago. His vaccination history includes completed shingles vaccines and an up-to-date tetanus shot valid until 2032. He is current with colonoscopy screenings, with the next one due in 2027.  Type 2 diabetes is managed with metformin  XR 1500 mg daily and Ozempic  2 mg weekly. Ozempic  has been effective for weight loss and improving sleep and mobility. However, there is a decrease in libido and changes in erectile function since starting Ozempic , described as an acute change.  Hypertension is managed with amlodipine  10 mg daily, hydrochlorothiazide  25 mg daily, and lisinopril  40 mg daily. He experiences elevated blood pressure readings in the office, attributed to anxiety, with normal readings at home.  Hyperlipidemia is managed with atorvastatin  80 mg daily, and he is compliant with his medication regimen.  Chronic kidney disease stage 3A is present, and routine blood work will include kidney function  tests.        Last depression screening scores    06/03/2023    9:52 AM 11/20/2022    3:04 PM 05/17/2022    8:22 AM  PHQ 2/9 Scores  PHQ - 2 Score 0 0 0   Last fall risk screening    06/03/2023    9:52 AM  Fall Risk   Falls in the past year? 0  Number falls in past yr: 0  Injury with Fall? 0  Risk for fall due to : No Fall Risks  Follow up Falls evaluation completed        Medications: Outpatient Medications Prior to Visit  Medication Sig   amLODipine  (NORVASC ) 10 MG tablet TAKE 1 TABLET BY MOUTH EVERY DAY   Ascorbic Acid (VITAMIN C PO) Take by mouth daily.   atorvastatin  (LIPITOR) 80 MG tablet TAKE 1 TABLET BY MOUTH EVERY DAY   cyclobenzaprine  (FLEXERIL ) 10 MG tablet Take 1 tablet (10 mg total) by mouth 3 (three) times daily as needed for muscle spasms.   diclofenac  (VOLTAREN ) 75 MG EC tablet Take 1 tablet (75 mg total) by mouth 2 (two) times daily as needed.   hydrochlorothiazide  (HYDRODIURIL ) 25 MG tablet TAKE 1 TABLET (25 MG TOTAL) BY MOUTH DAILY.   indomethacin  (INDOCIN ) 25 MG capsule TAKE 1 CAPSULE BY MOUTH THREE TIMES A DAY AS NEEDED   lidocaine  (XYLOCAINE ) 2 % solution Please specify directions, refills and quantity   lisinopril  (ZESTRIL ) 40 MG tablet TAKE 1 TABLET BY MOUTH EVERY  DAY   meclizine  (ANTIVERT ) 25 MG tablet TAKE 1 TABLET BY MOUTH EVERY 6 HOURS AS NEEDED.   metFORMIN  (GLUCOPHAGE -XR) 750 MG 24 hr tablet TAKE 2 TABLETS (1,500 MG TOTAL) BY MOUTH EVERY DAY WITH BREAKFAST   Multiple Vitamin (MULTIVITAMIN) tablet Take 1 tablet by mouth daily.   Semaglutide , 2 MG/DOSE, (OZEMPIC , 2 MG/DOSE,) 8 MG/3ML SOPN INJECT 2 MG INTO THE SKIN ONCE A WEEK.   tadalafil  (CIALIS ) 20 MG tablet Take 0.5 tablets (10 mg total) by mouth daily as needed for erectile dysfunction.   No facility-administered medications prior to visit.    Review of Systems    Objective    BP 130/80 (BP Location: Left Arm, Patient Position: Sitting, Cuff Size: Large)   Pulse 78   Ht 6\' 3"  (1.905  m)   Wt 211 lb 6.4 oz (95.9 kg)   SpO2 100%   BMI 26.42 kg/m    Physical Exam Vitals reviewed.  Constitutional:      General: He is not in acute distress.    Appearance: Normal appearance. He is well-developed. He is not diaphoretic.  HENT:     Head: Normocephalic and atraumatic.     Right Ear: Tympanic membrane, ear canal and external ear normal.     Left Ear: Tympanic membrane, ear canal and external ear normal.     Nose: Nose normal.     Mouth/Throat:     Mouth: Mucous membranes are moist.     Pharynx: Oropharynx is clear. No oropharyngeal exudate.  Eyes:     General: No scleral icterus.    Conjunctiva/sclera: Conjunctivae normal.     Pupils: Pupils are equal, round, and reactive to light.  Neck:     Thyroid: No thyromegaly.  Cardiovascular:     Rate and Rhythm: Normal rate and regular rhythm.     Heart sounds: Normal heart sounds. No murmur heard. Pulmonary:     Effort: Pulmonary effort is normal. No respiratory distress.     Breath sounds: Normal breath sounds. No wheezing or rales.  Abdominal:     General: There is no distension.     Palpations: Abdomen is soft.     Tenderness: There is no abdominal tenderness.  Musculoskeletal:        General: No deformity.     Cervical back: Neck supple.     Right lower leg: No edema.     Left lower leg: No edema.  Lymphadenopathy:     Cervical: No cervical adenopathy.  Skin:    General: Skin is warm and dry.     Findings: No rash.  Neurological:     Mental Status: He is alert and oriented to person, place, and time. Mental status is at baseline.     Gait: Gait normal.  Psychiatric:        Mood and Affect: Mood normal.        Behavior: Behavior normal.        Thought Content: Thought content normal.      No results found for any visits on 06/03/23.  Assessment & Plan    Routine Health Maintenance and Physical Exam  Exercise Activities and Dietary recommendations  Goals   None     Immunization History   Administered Date(s) Administered   Influenza, Seasonal, Injecte, Preservative Fre 12/03/2022   Influenza,inj,Quad PF,6+ Mos 11/02/2018, 11/18/2019, 11/13/2021   PFIZER(Purple Top)SARS-COV-2 Vaccination 04/30/2019, 09/12/2019, 02/12/2020   Pfizer Covid-19 Vaccine Bivalent Booster 63yrs & up 02/12/2021   Pfizer(Comirnaty)Fall Seasonal Vaccine 12 years  and older 12/21/2021   Pneumococcal Polysaccharide-23 04/13/2008   Tdap 01/01/2010, 03/02/2020   Zoster Recombinant(Shingrix ) 05/11/2021, 08/10/2021    Health Maintenance  Topic Date Due   Pneumococcal Vaccine 47-49 Years old (2 of 2 - PCV) 04/13/2009   COVID-19 Vaccine (6 - 2024-25 season) 09/29/2022   Diabetic kidney evaluation - Urine ACR  05/17/2023   HEMOGLOBIN A1C  06/02/2023   OPHTHALMOLOGY EXAM  07/13/2023   INFLUENZA VACCINE  08/29/2023   Diabetic kidney evaluation - eGFR measurement  12/03/2023   FOOT EXAM  12/03/2023   Colonoscopy  03/22/2025   DTaP/Tdap/Td (3 - Td or Tdap) 03/02/2030   Hepatitis C Screening  Completed   HIV Screening  Completed   Zoster Vaccines- Shingrix   Completed   HPV VACCINES  Aged Out   Meningococcal B Vaccine  Aged Out    Discussed health benefits of physical activity, and encouraged him to engage in regular exercise appropriate for his age and condition.  Problem List Items Addressed This Visit       Cardiovascular and Mediastinum   Hypertension associated with diabetes (HCC)   Relevant Orders   Comprehensive metabolic panel with GFR     Endocrine   T2DM (type 2 diabetes mellitus) (HCC)   Relevant Orders   Urine microalbumin-creatinine with uACR   Hemoglobin A1c   Hyperlipidemia due to type 2 diabetes mellitus (HCC)   Relevant Orders   Comprehensive metabolic panel with GFR   Lipid Panel With LDL/HDL Ratio     Genitourinary   Stage 3a chronic kidney disease (HCC)   Relevant Orders   Urine microalbumin-creatinine with uACR   Comprehensive metabolic panel with GFR   Other Visit  Diagnoses       Encounter for annual physical exam    -  Primary   Relevant Orders   Urine microalbumin-creatinine with uACR   Comprehensive metabolic panel with GFR   Lipid Panel With LDL/HDL Ratio   Hemoglobin A1c     Immunization due       Relevant Orders   Pneumococcal conjugate vaccine 20-valent     Screening for prostate cancer       Relevant Orders   PSA Total (Reflex To Free)           Wellness Visit Annual physical examination conducted. No acute complaints reported. Discussion on vaccinations and screenings conducted. He is due for a pneumonia vaccine and is considering a COVID-19 booster. Other vaccinations and screenings are up to date. - Administer pneumonia vaccine - Discuss COVID-19 booster options with him - Perform urine test for diabetes - Order blood work including cholesterol, kidney and liver function, and A1c - Perform PSA test for prostate cancer screening       Return in about 3 months (around 09/03/2023) for chronic disease f/u.     Aden Agreste, MD  [facility] [phone] (phone) [phone] (fax)  [facility]

## 2023-06-03 NOTE — Assessment & Plan Note (Signed)
 Diabetes management includes metformin  and Ozempic . He reports improved weight and sleep with Ozempic  but experiences decreased libido and erectile changes, possibly related to medication. Discussion on potential medication adjustments to address these issues. - Order A1c test - Consider discontinuing metformin  if A1c is controlled - Reassess diabetes management in 3 months

## 2023-06-03 NOTE — Assessment & Plan Note (Signed)
 Blood pressure readings are elevated in the office setting, likely due to white coat syndrome. Home blood pressure readings are normal. He is encouraged to monitor blood pressure at home and bring readings to future visits. - Check blood pressure manually at the end of the visit - normalized - continue current medications - Encourage him to bring home blood pressure readings to future visits

## 2023-06-03 NOTE — Assessment & Plan Note (Signed)
 Chronic and stable Recheck metabolic panel Avoid nephrotoxic meds

## 2023-06-04 ENCOUNTER — Encounter: Payer: Self-pay | Admitting: Family Medicine

## 2023-06-04 ENCOUNTER — Other Ambulatory Visit: Payer: Self-pay

## 2023-06-04 DIAGNOSIS — R7989 Other specified abnormal findings of blood chemistry: Secondary | ICD-10-CM

## 2023-06-04 LAB — MICROALBUMIN / CREATININE URINE RATIO
Creatinine, Urine: 435.5 mg/dL
Microalb/Creat Ratio: 3 mg/g{creat} (ref 0–29)
Microalbumin, Urine: 14.8 ug/mL

## 2023-06-04 LAB — COMPREHENSIVE METABOLIC PANEL WITH GFR
ALT: 249 IU/L — ABNORMAL HIGH (ref 0–44)
AST: 200 IU/L — ABNORMAL HIGH (ref 0–40)
Albumin: 4.6 g/dL (ref 3.8–4.9)
Alkaline Phosphatase: 213 IU/L — ABNORMAL HIGH (ref 44–121)
BUN/Creatinine Ratio: 15 (ref 9–20)
BUN: 21 mg/dL (ref 6–24)
Bilirubin Total: 0.7 mg/dL (ref 0.0–1.2)
CO2: 20 mmol/L (ref 20–29)
Calcium: 9.9 mg/dL (ref 8.7–10.2)
Chloride: 102 mmol/L (ref 96–106)
Creatinine, Ser: 1.38 mg/dL — ABNORMAL HIGH (ref 0.76–1.27)
Globulin, Total: 3.3 g/dL (ref 1.5–4.5)
Glucose: 140 mg/dL — ABNORMAL HIGH (ref 70–99)
Potassium: 5.3 mmol/L — ABNORMAL HIGH (ref 3.5–5.2)
Sodium: 138 mmol/L (ref 134–144)
Total Protein: 7.9 g/dL (ref 6.0–8.5)
eGFR: 60 mL/min/{1.73_m2} (ref >59)

## 2023-06-04 LAB — LIPID PANEL WITH LDL/HDL RATIO
Cholesterol, Total: 136 mg/dL (ref 100–199)
HDL: 41 mg/dL (ref >39)
LDL Chol Calc (NIH): 75 mg/dL (ref 0–99)
LDL/HDL Ratio: 1.8 ratio (ref 0.0–3.6)
Triglycerides: 110 mg/dL (ref 0–149)
VLDL Cholesterol Cal: 20 mg/dL (ref 5–40)

## 2023-06-04 LAB — HEMOGLOBIN A1C
Est. average glucose Bld gHb Est-mCnc: 143 mg/dL
Hgb A1c MFr Bld: 6.6 % — ABNORMAL HIGH (ref 4.8–5.6)

## 2023-06-06 ENCOUNTER — Other Ambulatory Visit: Payer: Self-pay | Admitting: Family Medicine

## 2023-06-06 ENCOUNTER — Telehealth: Payer: Self-pay | Admitting: Family Medicine

## 2023-06-06 DIAGNOSIS — N1831 Chronic kidney disease, stage 3a: Secondary | ICD-10-CM

## 2023-06-06 DIAGNOSIS — I152 Hypertension secondary to endocrine disorders: Secondary | ICD-10-CM

## 2023-06-06 DIAGNOSIS — M109 Gout, unspecified: Secondary | ICD-10-CM

## 2023-06-06 NOTE — Telephone Encounter (Signed)
 Copied from CRM [id number]. Topic: Clinical - Medication Refill >> Jun 06, 2023  5:01 PM Carla L wrote: Medication: amLODipine  (NORVASC ) 10 MG tablet  Has the patient contacted their pharmacy? No  This is the patient's preferred pharmacy:  [id number] [place] [facility] - [address] [place] [address] Phone: [phone] Fax: [phone]   Is this the correct pharmacy for this prescription? Yes  Has the prescription been filled recently? No  Is the patient out of the medication? Yes  Has the patient been seen for an appointment in the last year OR does the patient have an upcoming appointment? Yes  Can we respond through MyChart? Yes  Agent: Please be advised that Rx refills may take up to 3 business days. We ask that you follow-up with your pharmacy.

## 2023-06-09 NOTE — Telephone Encounter (Signed)
 Request forwarded to the wrong office.

## 2023-06-09 NOTE — Telephone Encounter (Signed)
 Ok to send refill

## 2023-06-09 NOTE — Telephone Encounter (Signed)
 Rx not sent. Rx sent 03/26/23 [facility] reports they will prepare rx for pickup Pt advised. Verbalized understanding

## 2023-06-23 ENCOUNTER — Other Ambulatory Visit: Payer: Self-pay | Admitting: Family Medicine

## 2023-06-23 DIAGNOSIS — M109 Gout, unspecified: Secondary | ICD-10-CM

## 2023-06-23 DIAGNOSIS — E1159 Type 2 diabetes mellitus with other circulatory complications: Secondary | ICD-10-CM

## 2023-06-23 DIAGNOSIS — N1831 Chronic kidney disease, stage 3a: Secondary | ICD-10-CM

## 2023-06-24 ENCOUNTER — Ambulatory Visit: Payer: Self-pay | Admitting: Family Medicine

## 2023-06-24 ENCOUNTER — Telehealth: Payer: Self-pay | Admitting: Family Medicine

## 2023-06-24 ENCOUNTER — Ambulatory Visit
Admission: RE | Admit: 2023-06-24 | Discharge: 2023-06-24 | Disposition: A | Discharge status: Home / Self Care | Source: Ambulatory Visit | Attending: Family Medicine | Admitting: Family Medicine

## 2023-06-24 DIAGNOSIS — R7989 Other specified abnormal findings of blood chemistry: Secondary | ICD-10-CM | POA: Diagnosis not present

## 2023-06-24 NOTE — Telephone Encounter (Signed)
 Copied from CRM [id number]. Topic: Clinical - Medication Refill >> Jun 24, 2023  9:00 AM Zipporah Him wrote: Medication: indomethacin  (INDOCIN ) 25 MG capsule  Has the patient contacted their pharmacy? Yes   This is the patient's preferred pharmacy:  [id number] [place] [facility] - [address] [place] [address] Phone: [phone] Fax: [phone]   Is this the correct pharmacy for this prescription? Yes If no, delete pharmacy and type the correct one.   Has the prescription been filled recently? No  Is the patient out of the medication? Yes, all out side   Has the patient been seen for an appointment in the last year OR does the patient have an upcoming appointment? Yes  Can we respond through MyChart? Yes  Agent: Please be advised that Rx refills may take up to 3 business days. We ask that you follow-up with your pharmacy.

## 2023-07-20 ENCOUNTER — Other Ambulatory Visit: Payer: Self-pay | Admitting: Family Medicine

## 2023-07-20 DIAGNOSIS — E1169 Type 2 diabetes mellitus with other specified complication: Secondary | ICD-10-CM

## 2023-08-15 ENCOUNTER — Other Ambulatory Visit: Payer: Self-pay | Admitting: Family Medicine

## 2023-08-15 DIAGNOSIS — M109 Gout, unspecified: Secondary | ICD-10-CM

## 2023-08-29 ENCOUNTER — Other Ambulatory Visit: Payer: Self-pay | Admitting: Family Medicine

## 2023-08-29 DIAGNOSIS — N529 Male erectile dysfunction, unspecified: Secondary | ICD-10-CM

## 2023-08-29 MED ORDER — TADALAFIL 20 MG PO TABS: 10.0000 mg | ORAL_TABLET | Freq: Every day | ORAL | 0 refills | Status: DC | PRN

## 2023-08-29 NOTE — Telephone Encounter (Signed)
 Requested Prescriptions  Pending Prescriptions Disp Refills   tadalafil  (CIALIS ) 20 MG tablet 90 tablet 0    Sig: Take 0.5 tablets (10 mg total) by mouth daily as needed for erectile dysfunction.     Urology: Erectile Dysfunction Agents Failed - 08/29/2023  3:45 PM      Failed - AST in normal range and within 360 days    AST  Date Value Ref Range Status  06/03/2023 200 (H) 0 - 40 IU/L Final         Failed - ALT in normal range and within 360 days    ALT  Date Value Ref Range Status  06/03/2023 249 (H) 0 - 44 IU/L Final         Passed - Last BP in normal range    BP Readings from Last 1 Encounters:  06/03/23 130/80         Passed - Valid encounter within last 12 months    Recent Outpatient Visits           2 months ago Encounter for annual physical exam   [facility] [place], Jon HERO, MD

## 2023-08-29 NOTE — Telephone Encounter (Signed)
 Copied from CRM [id number]. Topic: Clinical - Medication Refill >> Aug 29, 2023 11:20 AM DeAngela L wrote: Medication: tadalafil  (CIALIS ) 20 MG tablet  Has the patient contacted their pharmacy? Yes  (Agent: If no, request that the patient contact the pharmacy for the refill. If patient does not wish to contact the pharmacy document the reason why and proceed with request.) (Agent: If yes, when and what did the pharmacy advise?)  This is the patient's preferred pharmacy:   [facility] [address] [facility] - [place], [place] - [address] [facility] [address] [facility] [address] [place] [address] Phone: [phone] Fax: [phone]  Is this the correct pharmacy for this prescription? Yes  If no, delete pharmacy and type the correct one.   Has the prescription been filled recently? No  Is the patient out of the medication? Yes   Has the patient been seen for an appointment in the last year OR does the patient have an upcoming appointment? Yes   Can we respond through MyChart? No   Agent: Please be advised that Rx refills may take up to 3 business days. We ask that you follow-up with your pharmacy.

## 2023-09-09 ENCOUNTER — Ambulatory Visit: Admitting: Family Medicine

## 2023-09-09 ENCOUNTER — Other Ambulatory Visit: Payer: Self-pay

## 2023-09-09 VITALS — BP 128/84 | HR 87 | Ht 75.0 in | Wt 212.0 lb

## 2023-09-09 DIAGNOSIS — M7741 Metatarsalgia, right foot: Secondary | ICD-10-CM

## 2023-09-09 DIAGNOSIS — E785 Hyperlipidemia, unspecified: Secondary | ICD-10-CM | POA: Diagnosis not present

## 2023-09-09 DIAGNOSIS — Z7984 Long term (current) use of oral hypoglycemic drugs: Secondary | ICD-10-CM

## 2023-09-09 DIAGNOSIS — E1159 Type 2 diabetes mellitus with other circulatory complications: Secondary | ICD-10-CM | POA: Diagnosis not present

## 2023-09-09 DIAGNOSIS — R7989 Other specified abnormal findings of blood chemistry: Secondary | ICD-10-CM | POA: Diagnosis not present

## 2023-09-09 DIAGNOSIS — N5201 Erectile dysfunction due to arterial insufficiency: Secondary | ICD-10-CM | POA: Diagnosis not present

## 2023-09-09 DIAGNOSIS — M21611 Bunion of right foot: Secondary | ICD-10-CM | POA: Diagnosis not present

## 2023-09-09 DIAGNOSIS — E1169 Type 2 diabetes mellitus with other specified complication: Secondary | ICD-10-CM

## 2023-09-09 DIAGNOSIS — Z789 Other specified health status: Secondary | ICD-10-CM

## 2023-09-09 DIAGNOSIS — I152 Hypertension secondary to endocrine disorders: Secondary | ICD-10-CM | POA: Diagnosis not present

## 2023-09-09 DIAGNOSIS — N1831 Chronic kidney disease, stage 3a: Secondary | ICD-10-CM

## 2023-09-09 MED ORDER — MELOXICAM 15 MG PO TABS: 15.0000 mg | ORAL_TABLET | Freq: Every day | ORAL | 1 refills | Status: DC

## 2023-09-09 MED ORDER — SILDENAFIL CITRATE 100 MG PO TABS
100.0000 mg | ORAL_TABLET | Freq: Every day | ORAL | 11 refills | Status: AC | PRN
  Filled 2023-09-09: qty 15, 15d supply, fill #0
  Filled 2023-10-08: qty 15, 15d supply, fill #1
  Filled 2023-11-06: qty 15, 15d supply, fill #2
  Filled 2023-11-14: qty 6, 25d supply, fill #3
  Filled 2024-01-01: qty 10, 30d supply, fill #3
  Filled 2024-01-01: qty 15, 15d supply, fill #3

## 2023-09-09 NOTE — Assessment & Plan Note (Signed)
 Well controlled Continue current medications Recheck metabolic panel

## 2023-09-09 NOTE — Progress Notes (Signed)
 Established patient visit   Patient: Timothy Douglas   DOB: 1970/11/01   53 y.o. Male  MRN: [id number] Visit Date: 09/09/2023  Today's healthcare provider: Jon Eva, MD   Chief Complaint  Patient presents with   Diabetes    No concerns    Subjective    Diabetes   HPI     Diabetes    Additional comments: No concerns       Last edited by Thelbert Eulalio HERO, CMA on 09/09/2023  8:12 AM.       Discussed the use of AI scribe software for clinical note transcription with the patient, who gave verbal consent to proceed.  History of Present Illness   Timothy Douglas is a 53 year old male who presents for further evaluation of elevated liver function tests.  Liver function tests have shown significant increases over a six-month period, although a liver ultrasound appeared normal.  He experiences foot pain with significant swelling and occasional dark discoloration, initially thought to be gout but now suspected to be due to a bunion. He uses indomethacin , lidocaine  spray, and rubbing alcohol for relief. His work involves standing and walking on concrete, which may contribute to the pain. There is a family history of bunions.  He is currently taking Ozempic  for diabetes management, having been taken off metformin  and glipizide . Ozempic  is effective, and he is not on other diabetes medications.  He uses Cialis  for erectile dysfunction, which is no longer effective. He has previously used Viagra  with initial success and is considering its use again. His testosterone  levels have not been checked recently.         Medications: Outpatient Medications Prior to Visit  Medication Sig   amLODipine  (NORVASC ) 10 MG tablet TAKE 1 TABLET BY MOUTH EVERY DAY   Ascorbic Acid (VITAMIN C PO) Take by mouth daily.   atorvastatin  (LIPITOR) 80 MG tablet TAKE 1 TABLET BY MOUTH EVERY DAY   hydrochlorothiazide  (HYDRODIURIL ) 25 MG tablet TAKE 1 TABLET (25 MG TOTAL) BY MOUTH  DAILY.   lidocaine  (XYLOCAINE ) 2 % solution Please specify directions, refills and quantity   lisinopril  (ZESTRIL ) 40 MG tablet TAKE 1 TABLET BY MOUTH EVERY DAY   meclizine  (ANTIVERT ) 25 MG tablet TAKE 1 TABLET BY MOUTH EVERY 6 HOURS AS NEEDED.   Multiple Vitamin (MULTIVITAMIN) tablet Take 1 tablet by mouth daily.   Semaglutide , 2 MG/DOSE, (OZEMPIC , 2 MG/DOSE,) 8 MG/3ML SOPN INJECT 2 MG INTO THE SKIN ONCE A WEEK.   [DISCONTINUED] cyclobenzaprine  (FLEXERIL ) 10 MG tablet Take 1 tablet (10 mg total) by mouth 3 (three) times daily as needed for muscle spasms.   [DISCONTINUED] diclofenac  (VOLTAREN ) 75 MG EC tablet Take 1 tablet (75 mg total) by mouth 2 (two) times daily as needed.   [DISCONTINUED] indomethacin  (INDOCIN ) 25 MG capsule TAKE 1 CAPSULE BY MOUTH THREE TIMES A DAY AS NEEDED   [DISCONTINUED] metFORMIN  (GLUCOPHAGE -XR) 750 MG 24 hr tablet TAKE 2 TABLETS (1,500 MG TOTAL) BY MOUTH EVERY DAY WITH BREAKFAST (Patient not taking: Reported on 09/09/2023)   [DISCONTINUED] tadalafil  (CIALIS ) 20 MG tablet Take 0.5 tablets (10 mg total) by mouth daily as needed for erectile dysfunction.   No facility-administered medications prior to visit.    Review of Systems     Objective    BP 128/84   Pulse 87   Ht 6' 3 (1.905 m)   Wt 212 lb (96.2 kg)   SpO2 100%   BMI 26.50 kg/m    Physical  Exam Vitals reviewed.  Constitutional:      General: He is not in acute distress.    Appearance: Normal appearance. He is not diaphoretic.  HENT:     Head: Normocephalic and atraumatic.  Eyes:     General: No scleral icterus.    Conjunctiva/sclera: Conjunctivae normal.  Cardiovascular:     Rate and Rhythm: Normal rate and regular rhythm.     Heart sounds: Normal heart sounds. No murmur heard. Pulmonary:     Effort: Pulmonary effort is normal. No respiratory distress.     Breath sounds: Normal breath sounds. No wheezing or rhonchi.  Musculoskeletal:     Cervical back: Neck supple.     Right lower leg:  No edema.     Left lower leg: No edema.     Comments: Bunion on R great MTP  Lymphadenopathy:     Cervical: No cervical adenopathy.  Skin:    General: Skin is warm and dry.     Findings: No rash.  Neurological:     Mental Status: He is alert and oriented to person, place, and time. Mental status is at baseline.  Psychiatric:        Mood and Affect: Mood normal.        Behavior: Behavior normal.      No results found for any visits on 09/09/23.  Assessment & Plan     Problem List Items Addressed This Visit       Cardiovascular and Mediastinum   Hypertension associated with diabetes (HCC) - Primary   Well controlled Continue current medications Recheck metabolic panel      Relevant Medications   sildenafil  (VIAGRA ) 100 MG tablet     Endocrine   T2DM (type 2 diabetes mellitus) (HCC)   Currently managed with Ozempic , with previous medications metformin  and glipizide  discontinued. Reports good control with Ozempic . - Order hemoglobin A1c test to assess diabetes control      Relevant Orders   Hemoglobin A1c   Hyperlipidemia due to type 2 diabetes mellitus (HCC)   Relevant Medications   sildenafil  (VIAGRA ) 100 MG tablet     Genitourinary   Stage 3a chronic kidney disease (HCC)   Chronic and stable Recheck metabolic panel Avoid nephrotoxic meds         Other   Erectile dysfunction   Current treatment with Cialis  (tadalafil ) is not effective. He has previously tried Viagra  (sildenafil ) with some success. Considering trying Viagra  again or exploring other medications in the same family. Testosterone  levels have not been checked recently and will be assessed. - Prescribe Viagra  (sildenafil ) at maximum dose - Order testosterone  level test - Consider referral to urology if erectile dysfunction persists       Relevant Orders   Testosterone ,Free and Total   Other Visit Diagnoses       Elevated LFTs       Relevant Orders   Hepatitis B surface antigen   Hep B Core  Ab W/Reflex   Hepatitis A antibody, total   Hepatitis C antibody     Metatarsalgia of right foot       Relevant Orders   Ambulatory referral to Podiatry     Bunion, right foot       Relevant Orders   Ambulatory referral to Podiatry     Hepatitis B vaccination status unknown       Relevant Orders   Hepatitis B Surface AntiBODY           Elevated liver function tests Liver  function tests have shown significant elevation over a six-month period, with some values nearly doubling. The ultrasound of the liver appeared normal. Indomethacin , which has been used regularly, is suspected to be contributing to the liver enzyme elevation, although it primarily affects the kidneys. Other potential causes such as autoimmune hepatitis, viral hepatitis, and muscle enzyme effects are being considered. - Discontinue indomethacin  - Order liver function tests - Order additional labs to investigate potential causes of liver enzyme elevation - Consider referral to gastroenterology if liver function tests worsen  Right hallux valgus (bunion) with chronic right foot pain Chronic right foot pain attributed to a bunion, with swelling and discomfort exacerbated by prolonged standing and walking. Indomethacin  has been used for pain management but is being discontinued due to potential liver effects. Alternative pain management options are being considered, and he is open to seeing a podiatrist for further evaluation and treatment. - Prescribe meloxicam  once daily for pain management - Refer to podiatry for further evaluation and potential interventions such as cortisone injections - Advise on proper footwear with good support and a wide toe box - Recommend icing the foot at the end of the day to reduce inflammation       Return in about 3 months (around 12/10/2023) for chronic disease f/u.       Jon Eva, MD  [facility] [phone] (phone) [phone] (fax)  [facility]

## 2023-09-09 NOTE — Assessment & Plan Note (Signed)
 Chronic and stable Recheck metabolic panel Avoid nephrotoxic meds

## 2023-09-09 NOTE — Assessment & Plan Note (Signed)
 Currently managed with Ozempic , with previous medications metformin  and glipizide  discontinued. Reports good control with Ozempic . - Order hemoglobin A1c test to assess diabetes control

## 2023-09-09 NOTE — Assessment & Plan Note (Signed)
 Current treatment with Cialis  (tadalafil ) is not effective. He has previously tried Viagra  (sildenafil ) with some success. Considering trying Viagra  again or exploring other medications in the same family. Testosterone  levels have not been checked recently and will be assessed. - Prescribe Viagra  (sildenafil ) at maximum dose - Order testosterone  level test - Consider referral to urology if erectile dysfunction persists

## 2023-09-10 ENCOUNTER — Ambulatory Visit: Payer: Self-pay | Admitting: Family Medicine

## 2023-09-10 DIAGNOSIS — R7989 Other specified abnormal findings of blood chemistry: Secondary | ICD-10-CM

## 2023-09-13 LAB — COMPREHENSIVE METABOLIC PANEL WITH GFR
ALT: 348 IU/L — ABNORMAL HIGH (ref 0–44)
AST: 303 IU/L — ABNORMAL HIGH (ref 0–40)
Albumin: 4.4 g/dL (ref 3.8–4.9)
Alkaline Phosphatase: 362 IU/L — ABNORMAL HIGH (ref 44–121)
BUN/Creatinine Ratio: 16 (ref 9–20)
BUN: 25 mg/dL — ABNORMAL HIGH (ref 6–24)
Bilirubin Total: 1.3 mg/dL — ABNORMAL HIGH (ref 0.0–1.2)
CO2: 20 mmol/L (ref 20–29)
Calcium: 9.9 mg/dL (ref 8.7–10.2)
Chloride: 100 mmol/L (ref 96–106)
Creatinine, Ser: 1.54 mg/dL — ABNORMAL HIGH (ref 0.76–1.27)
Globulin, Total: 4.2 g/dL (ref 1.5–4.5)
Glucose: 126 mg/dL — ABNORMAL HIGH (ref 70–99)
Potassium: 4.6 mmol/L (ref 3.5–5.2)
Sodium: 136 mmol/L (ref 134–144)
Total Protein: 8.6 g/dL — ABNORMAL HIGH (ref 6.0–8.5)
eGFR: 53 mL/min/1.73 — ABNORMAL LOW (ref >59)

## 2023-09-13 LAB — HEPATITIS B SURFACE ANTIBODY,QUALITATIVE

## 2023-09-13 LAB — TESTOSTERONE,FREE AND TOTAL
Testosterone, Free: 12.3 pg/mL (ref 7.2–24.0)
Testosterone: 1313 ng/dL — ABNORMAL HIGH (ref 264–916)

## 2023-09-13 LAB — CK: Total CK: 330 U/L (ref 41–331)

## 2023-09-13 LAB — HEMOGLOBIN A1C
Est. average glucose Bld gHb Est-mCnc: 171 mg/dL
Hgb A1c MFr Bld: 7.6 % — ABNORMAL HIGH (ref 4.8–5.6)

## 2023-09-13 LAB — HEPATITIS B CORE AB W/REFLEX: Hep B Core Total Ab: NEGATIVE

## 2023-09-13 LAB — HIV ANTIBODY (ROUTINE TESTING W REFLEX): HIV Screen 4th Generation wRfx: NONREACTIVE

## 2023-09-13 LAB — HEPATITIS C ANTIBODY: Hep C Virus Ab: NONREACTIVE

## 2023-09-13 LAB — ANA W/REFLEX IF POSITIVE: Anti Nuclear Antibody (ANA): NEGATIVE

## 2023-09-13 LAB — HEPATITIS B SURFACE ANTIGEN: Hepatitis B Surface Ag: NEGATIVE

## 2023-09-13 LAB — HEPATITIS A ANTIBODY, TOTAL: hep A Total Ab: NEGATIVE

## 2023-09-24 ENCOUNTER — Ambulatory Visit (INDEPENDENT_AMBULATORY_CARE_PROVIDER_SITE_OTHER): Admitting: Podiatry

## 2023-09-24 ENCOUNTER — Ambulatory Visit (INDEPENDENT_AMBULATORY_CARE_PROVIDER_SITE_OTHER)

## 2023-09-24 ENCOUNTER — Other Ambulatory Visit: Payer: Self-pay

## 2023-09-24 DIAGNOSIS — R42 Dizziness and giddiness: Secondary | ICD-10-CM

## 2023-09-24 DIAGNOSIS — I10 Essential (primary) hypertension: Secondary | ICD-10-CM

## 2023-09-24 DIAGNOSIS — M2011 Hallux valgus (acquired), right foot: Secondary | ICD-10-CM

## 2023-09-24 DIAGNOSIS — M7662 Achilles tendinitis, left leg: Secondary | ICD-10-CM | POA: Diagnosis not present

## 2023-09-24 DIAGNOSIS — N1831 Chronic kidney disease, stage 3a: Secondary | ICD-10-CM

## 2023-09-24 DIAGNOSIS — E1169 Type 2 diabetes mellitus with other specified complication: Secondary | ICD-10-CM

## 2023-09-24 DIAGNOSIS — M1A2710 Drug-induced chronic gout, right ankle and foot, without tophus (tophi): Secondary | ICD-10-CM | POA: Diagnosis not present

## 2023-09-24 DIAGNOSIS — E1159 Type 2 diabetes mellitus with other circulatory complications: Secondary | ICD-10-CM

## 2023-09-24 MED ORDER — MELOXICAM 15 MG PO TABS
15.0000 mg | ORAL_TABLET | Freq: Every day | ORAL | 1 refills | Status: DC
  Filled 2023-09-24 – 2023-10-08 (×2): qty 30, 30d supply, fill #0
  Filled 2023-11-06 – 2023-12-27 (×4): qty 30, 30d supply, fill #1

## 2023-09-24 MED ORDER — METHYLPREDNISOLONE 4 MG PO TBPK
ORAL_TABLET | ORAL | 0 refills | Status: AC
  Filled 2023-09-24: qty 21, 6d supply, fill #0

## 2023-09-24 NOTE — Telephone Encounter (Signed)
 Copied from CRM #[id number]. Topic: Clinical - Medication Question >> Sep 24, 2023 10:52 AM Myrick T wrote: Reason for CRM: patient called stated he would like to have all his medication transferred [facility] to [facility] [address] [place] [address] Phone: [phone]  Fax: [phone] DEA #: [id number]

## 2023-09-24 NOTE — Progress Notes (Signed)
 Subjective:  Patient ID: Timothy Douglas, male    DOB: November 12, 1970,  MRN: [id number] HPI Chief Complaint  Patient presents with   Bunions    NP -right foot pain -bought a brace from [employer] that helped some. Swelling and redness in the first met right. Thought it might be gout. PCP gave him Meloxicam , which helped more than anything    53 y.o. male presents with the above complaint.   ROS: Denies fever chills nausea mobic  muscle aches pains calf pain back pain chest pain shortness of breath.  Relates having history of gout to the right foot.  Does not relate family history.  Recently been taking meloxicam  which has improved his first metatarsophalangeal joint pain and the Achilles tendinitis pain in the left foot.  He works for [employer].  Past Medical History:  Diagnosis Date   DJD (degenerative joint disease)    Essential hypertension    Hyperlipidemia due to type 2 diabetes mellitus (HCC)    Shoulder pain    T2DM (type 2 diabetes mellitus) (HCC)    Vertigo    1 episode over 10 yrs ago   Past Surgical History:  Procedure Laterality Date   APPENDECTOMY     COLONOSCOPY WITH PROPOFOL  N/A 03/19/2019   Procedure: COLONOSCOPY WITH BIOPSY;  Surgeon: Jinny Carmine, MD;  Location: [facility];  Service: Endoscopy;  Laterality: N/A;  Diabetic - oral meds Priority 4   COLONOSCOPY WITH PROPOFOL  N/A 03/22/2022   Procedure: COLONOSCOPY WITH PROPOFOL ;  Surgeon: Jinny Carmine, MD;  Location: [facility];  Service: Endoscopy;  Laterality: N/A;  Diabetic   EYE SURGERY     KELOID EXCISION  1990s   KNEE ARTHROSCOPY Left    LUMBAR DISC SURGERY     LUMBAR FUSION     L5-S1   POLYPECTOMY N/A 03/19/2019   Procedure: POLYPECTOMY;  Surgeon: Jinny Carmine, MD;  Location: [facility];  Service: Endoscopy;  Laterality: N/A;   POLYPECTOMY  03/22/2022   Procedure: POLYPECTOMY;  Surgeon: Jinny Carmine, MD;  Location: [facility];  Service:  Endoscopy;;   SPINAL CORD STIMULATOR IMPLANT     SPINE SURGERY  06/07    Current Outpatient Medications:    meloxicam  (MOBIC ) 15 MG tablet, Take 1 tablet (15 mg total) by mouth daily., Disp: 30 tablet, Rfl: 1   methylPREDNISolone  (MEDROL  DOSEPAK) 4 MG TBPK tablet, Take 6 tablets (24 mg total) by mouth daily for 1 day, THEN 5 tablets (20 mg total) daily for 1 day, THEN 4 tablets (16 mg total) daily for 1 day, THEN 3 tablets (12 mg total) daily for 1 day, THEN 2 tablets (8 mg total) daily for 1 day, THEN 1 tablet (4 mg total) daily for 1 day., Disp: 21 tablet, Rfl: 0   tadalafil  (CIALIS ) 20 MG tablet, Take 10 mg by mouth daily as needed., Disp: , Rfl:    amLODipine  (NORVASC ) 10 MG tablet, TAKE 1 TABLET BY MOUTH EVERY DAY, Disp: 90 tablet, Rfl: 1   Ascorbic Acid (VITAMIN C PO), Take by mouth daily., Disp: , Rfl:    atorvastatin  (LIPITOR) 80 MG tablet, TAKE 1 TABLET BY MOUTH EVERY DAY, Disp: 90 tablet, Rfl: 0   hydrochlorothiazide  (HYDRODIURIL ) 25 MG tablet, TAKE 1 TABLET (25 MG TOTAL) BY MOUTH DAILY., Disp: 90 tablet, Rfl: 1   lidocaine  (XYLOCAINE ) 2 % solution, Please specify directions, refills and quantity, Disp: 100 mL, Rfl: 0   lisinopril  (ZESTRIL ) 40 MG tablet, TAKE 1 TABLET BY MOUTH  EVERY DAY, Disp: 90 tablet, Rfl: 1   meclizine  (ANTIVERT ) 25 MG tablet, TAKE 1 TABLET BY MOUTH EVERY 6 HOURS AS NEEDED., Disp: 90 tablet, Rfl: 0   meloxicam  (MOBIC ) 15 MG tablet, Take 1 tablet (15 mg total) by mouth daily., Disp: 30 tablet, Rfl: 1   Multiple Vitamin (MULTIVITAMIN) tablet, Take 1 tablet by mouth daily., Disp: , Rfl:    Semaglutide , 2 MG/DOSE, (OZEMPIC , 2 MG/DOSE,) 8 MG/3ML SOPN, INJECT 2 MG INTO THE SKIN ONCE A WEEK., Disp: 9 mL, Rfl: 1   sildenafil  (VIAGRA ) 100 MG tablet, Take 1 tablet (100 mg total) by mouth daily as needed for erectile dysfunction., Disp: 15 tablet, Rfl: 11  Allergies  Allergen Reactions   Other Rash   Tape Rash   Review of Systems Objective:  There were no vitals filed  for this visit.  General: Well developed, nourished, in no acute distress, alert and oriented x3   Dermatological: Skin is warm, dry and supple bilateral. Nails x 10 are well maintained; remaining integument appears unremarkable at this time. There are no open sores, no preulcerative lesions, no rash or signs of infection present.  Vascular: Dorsalis Pedis artery and Posterior Tibial artery pedal pulses are 2/4 bilateral with immedate capillary fill time. Pedal hair growth present. No varicosities and no lower extremity edema present bilateral.   Neruologic: Grossly intact via light touch bilateral. Vibratory intact via tuning fork bilateral. Protective threshold with Semmes Wienstein monofilament intact to all pedal sites bilateral. Patellar and Achilles deep tendon reflexes 2+ bilateral. No Babinski or clonus noted bilateral.   Musculoskeletal: No gross boney pedal deformities bilateral. No pain, crepitus, or limitation noted with foot and ankle range of motion bilateral. Muscular strength 5/5 in all groups tested bilateral.  Mild tenderness on range of motion of the first metatarsophalangeal joint right foot with mild hallux abductovalgus deformity.  No palpable osteophytes at this time.  We do notice limitation on plantarflexion when compared to the contralateral foot.  It is mildly warm to the touch but does not demonstrate acute gout issues.  Contralateral foot does demonstrate some tenderness on palpation of the watershed area of the Achilles but has good full range of motion with plantarflexion against resistance.  5 out of 5 muscle strength.  Also does demonstrate some fluctuance on the posterior superior aspect of the calcaneus superficial to the Achilles.  Gait: Unassisted, Nonantalgic.    Radiographs:  Radiographs taken today of the right foot do demonstrate an osseously mature individual there appears to be a Martell sign developing in the first metatarsal phalangeal joint also a small  fragment of the medial proximal condyle of the proximal phalanx was avulsed at some point.  Otherwise the joint appears to be relatively normal some early joint space narrowing is visible.  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  Left foot superficial Achilles bursitis and then Achilles tendinitis.  Plan: Discussed etiology pathology conservative versus surgical therapies I recommended that he continue the meloxicam  after starting a Medrol  Dosepak and completing that.  I also discussed utilizing topical anti-inflammatory such as Voltaren .  He will not use his meloxicam  during these times.  Should his gout recur he is to notify us  immediately.  Will follow-up with him.  More than likely without a family history a very well could be associated with his hydrochlorothiazide .     Morse Brueggemann T. Gordon, [place]

## 2023-09-25 MED ORDER — HYDROCHLOROTHIAZIDE 25 MG PO TABS: 25.0000 mg | ORAL_TABLET | Freq: Every day | ORAL | 1 refills | Status: DC

## 2023-09-25 MED ORDER — AMLODIPINE BESYLATE 10 MG PO TABS: 10.0000 mg | ORAL_TABLET | Freq: Every day | ORAL | 1 refills | Status: DC

## 2023-09-25 MED ORDER — MELOXICAM 15 MG PO TABS: 15.0000 mg | ORAL_TABLET | Freq: Every day | ORAL | 1 refills | Status: DC

## 2023-09-25 MED ORDER — OZEMPIC (2 MG/DOSE) 8 MG/3ML ~~LOC~~ SOPN: 2.0000 mg | PEN_INJECTOR | SUBCUTANEOUS | 1 refills | Status: DC

## 2023-09-25 MED ORDER — MECLIZINE HCL 25 MG PO TABS: 25.0000 mg | ORAL_TABLET | Freq: Four times a day (QID) | ORAL | 1 refills | Status: AC | PRN

## 2023-09-25 MED ORDER — LISINOPRIL 40 MG PO TABS: 40.0000 mg | ORAL_TABLET | Freq: Every day | ORAL | 1 refills | Status: DC

## 2023-09-25 MED ORDER — ATORVASTATIN CALCIUM 80 MG PO TABS
80.0000 mg | ORAL_TABLET | Freq: Every day | ORAL | 1 refills | Status: DC
Start: 2023-09-25 — End: 2023-12-11

## 2023-09-26 NOTE — Telephone Encounter (Signed)
 Patient calling in and states his prescriptions were sent to the wrong pharmacy. Patient requested for his prescriptions to be sent to [facility]. Pharmacy list has been updated to reflect [facility] as the main pharmacy to use.

## 2023-10-08 ENCOUNTER — Other Ambulatory Visit: Payer: Self-pay

## 2023-10-08 ENCOUNTER — Other Ambulatory Visit: Payer: Self-pay | Admitting: Family Medicine

## 2023-11-06 ENCOUNTER — Ambulatory Visit (INDEPENDENT_AMBULATORY_CARE_PROVIDER_SITE_OTHER): Admitting: Family Medicine

## 2023-11-06 ENCOUNTER — Other Ambulatory Visit: Payer: Self-pay

## 2023-11-06 ENCOUNTER — Ambulatory Visit: Payer: Self-pay | Admitting: Family Medicine

## 2023-11-06 ENCOUNTER — Encounter: Payer: Self-pay | Admitting: Family Medicine

## 2023-11-06 VITALS — BP 114/70 | HR 88 | Temp 98.4°F | Ht 75.0 in | Wt 214.4 lb

## 2023-11-06 DIAGNOSIS — Z23 Encounter for immunization: Secondary | ICD-10-CM | POA: Diagnosis not present

## 2023-11-06 DIAGNOSIS — H6992 Unspecified Eustachian tube disorder, left ear: Secondary | ICD-10-CM

## 2023-11-06 DIAGNOSIS — R0981 Nasal congestion: Secondary | ICD-10-CM | POA: Diagnosis not present

## 2023-11-06 LAB — POC COVID19 BINAXNOW: SARS Coronavirus 2 Ag: NEGATIVE

## 2023-11-06 MED ORDER — FLUTICASONE PROPIONATE 50 MCG/ACT NA SUSP
2.0000 | Freq: Every day | NASAL | 6 refills | Status: AC
  Filled 2023-11-06: qty 16, 30d supply, fill #0
  Filled 2024-01-01 (×2): qty 16, 30d supply, fill #1

## 2023-11-06 NOTE — Progress Notes (Signed)
 Acute visit   Patient: Timothy Douglas   DOB: 1970/12/20   53 y.o. Male  MRN: [id number] PCP: Myrla Jon HERO, MD   Chief Complaint  Patient presents with   Acute Visit   Tinnitus    Ringing in left ear X Tuesday night. Hx of sensation about 5.5 yrs ago when he had covid.  Reports it feels clogged and sounds muffled. He reports taking a home covid test but it was negative. No other symptoms to report. He has not checked temp. Nasal passage feels dry. No sick contacts he is aware of.    Subjective    Discussed the use of AI scribe software for clinical note transcription with the patient, who gave verbal consent to proceed.  History of Present Illness   Timothy Douglas is a 53 year old male who presents with left ear ringing and muffled hearing.  He experiences sudden onset of ringing and muffled hearing in his left ear since late Tuesday night or early Wednesday morning. The symptoms began with a clogged sensation, followed by a high-pitched ringing that disrupts his sleep. He recalls a similar experience on a previous night.  A negative at-home COVID test was taken, and he has no fever, cough, or other respiratory symptoms. Nasal passages are dry with occasional itchiness in the ear. He senses a potential cold developing in the left nasal passage without pain, and his sinuses feel like they want to drain but are not.  He is not taking specific medication for this issue but has Afrin nasal spray at home and inquires about its use with other treatments.        Review of Systems  Objective    BP 114/70 (BP Location: Left Arm, Patient Position: Sitting, Cuff Size: Normal)   Pulse 88   Temp 98.4 F (36.9 C) (Oral)   Ht 6' 3 (1.905 m)   Wt 214 lb 6.4 oz (97.3 kg)   SpO2 99%   BMI 26.80 kg/m  Physical Exam Constitutional:      General: He is not in acute distress.    Appearance: Normal appearance. He is not diaphoretic.  HENT:     Head: Normocephalic.      Right Ear: Tympanic membrane, ear canal and external ear normal.     Left Ear: Tympanic membrane, ear canal and external ear normal.     Nose: Congestion present.     Mouth/Throat:     Mouth: Mucous membranes are moist.     Pharynx: Oropharynx is clear. No oropharyngeal exudate.  Eyes:     Conjunctiva/sclera: Conjunctivae normal.  Cardiovascular:     Rate and Rhythm: Normal rate and regular rhythm.     Heart sounds: Normal heart sounds. No murmur heard. Pulmonary:     Effort: Pulmonary effort is normal. No respiratory distress.     Breath sounds: Normal breath sounds. No wheezing.  Neurological:     Mental Status: He is alert. Mental status is at baseline.       Results for orders placed or performed in visit on 11/06/23  POC COVID-19 BinaxNow  Result Value Ref Range   SARS Coronavirus 2 Ag Negative Negative    Assessment & Plan     Problem List Items Addressed This Visit   None Visit Diagnoses       Eustachian tube disorder, left    -  Primary   Relevant Orders   POC COVID-19 BinaxNow (Completed)     Nasal  congestion       Relevant Orders   POC COVID-19 BinaxNow (Completed)     Immunization due       Relevant Orders   Flu vaccine trivalent PF, 6mos and older(Flulaval,Afluria,Fluarix,Fluzone) (Completed)           Eustachian tube dysfunction with nasal inflammation Acute onset of left ear tinnitus, muffled hearing, and sensation of fullness, likely due to eustachian tube dysfunction secondary to nasal inflammation. Examination reveals no cerumen impaction and a normal tympanic membrane. Nasal examination shows erythema and inflammation, suggesting nasal congestion is causing eustachian tube dysfunction. Differential diagnosis includes COVID-19, but a negative home test and lack of other symptoms make this less likely. - Perform COVID-19 swab test for confirmation. - Prescribe Flonase nasal spray, two sprays once daily in each nostril, to reduce nasal inflammation and  relieve eustachian tube dysfunction. - Advise on proper technique for using Flonase: angle spray towards the eye, avoid snorting back, and use a gentle sniff. - Recommend Afrin nasal spray for up to two days if quicker relief is needed, cautioning against rebound congestion. - Discuss potential for slow resolution over a couple of weeks with Flonase treatment.  General Health Maintenance Discussion on COVID-19 and influenza vaccinations. COVID-19 vaccination available without prescription in [place] . He is eligible for vaccination due to high-risk condition. - Advise to obtain COVID-19 vaccination at pharmacy without prescription, confirming eligibility by stating high-risk condition.       Meds ordered this encounter  Medications   fluticasone (FLONASE) 50 MCG/ACT nasal spray    Sig: Place 2 sprays into both nostrils daily.    Dispense:  16 g    Refill:  6     Return if symptoms worsen or fail to improve.      Jon Eva, MD  [facility] [phone] (phone) [phone] (fax)  [facility]

## 2023-11-11 ENCOUNTER — Other Ambulatory Visit: Payer: Self-pay

## 2023-11-11 MED ORDER — MECLIZINE HCL 25 MG PO TABS
25.0000 mg | ORAL_TABLET | Freq: Four times a day (QID) | ORAL | 1 refills | Status: DC | PRN
  Filled 2023-11-11 – 2024-01-23 (×4): qty 90, 23d supply, fill #0

## 2023-11-12 ENCOUNTER — Other Ambulatory Visit: Payer: Self-pay | Admitting: Family Medicine

## 2023-11-12 DIAGNOSIS — I152 Hypertension secondary to endocrine disorders: Secondary | ICD-10-CM

## 2023-11-14 ENCOUNTER — Other Ambulatory Visit: Payer: Self-pay

## 2023-11-21 ENCOUNTER — Other Ambulatory Visit: Payer: Self-pay

## 2023-11-24 ENCOUNTER — Other Ambulatory Visit: Payer: Self-pay

## 2023-12-07 ENCOUNTER — Other Ambulatory Visit: Payer: Self-pay | Admitting: Family Medicine

## 2023-12-07 DIAGNOSIS — I10 Essential (primary) hypertension: Secondary | ICD-10-CM

## 2023-12-11 ENCOUNTER — Other Ambulatory Visit: Payer: Self-pay

## 2023-12-11 ENCOUNTER — Ambulatory Visit (INDEPENDENT_AMBULATORY_CARE_PROVIDER_SITE_OTHER): Admitting: Family Medicine

## 2023-12-11 VITALS — BP 131/88 | HR 84 | Ht 75.0 in

## 2023-12-11 DIAGNOSIS — E1159 Type 2 diabetes mellitus with other circulatory complications: Secondary | ICD-10-CM

## 2023-12-11 DIAGNOSIS — E785 Hyperlipidemia, unspecified: Secondary | ICD-10-CM | POA: Diagnosis not present

## 2023-12-11 DIAGNOSIS — I152 Hypertension secondary to endocrine disorders: Secondary | ICD-10-CM

## 2023-12-11 DIAGNOSIS — E1169 Type 2 diabetes mellitus with other specified complication: Secondary | ICD-10-CM | POA: Diagnosis not present

## 2023-12-11 DIAGNOSIS — R7989 Other specified abnormal findings of blood chemistry: Secondary | ICD-10-CM | POA: Diagnosis not present

## 2023-12-11 DIAGNOSIS — I10 Essential (primary) hypertension: Secondary | ICD-10-CM | POA: Diagnosis not present

## 2023-12-11 DIAGNOSIS — Z7985 Long-term (current) use of injectable non-insulin antidiabetic drugs: Secondary | ICD-10-CM | POA: Diagnosis not present

## 2023-12-11 DIAGNOSIS — N1831 Chronic kidney disease, stage 3a: Secondary | ICD-10-CM | POA: Diagnosis not present

## 2023-12-11 LAB — POCT GLYCOSYLATED HEMOGLOBIN (HGB A1C): Hemoglobin A1C: 7.2 % — AB (ref 4.0–5.6)

## 2023-12-11 MED ORDER — HYDROCHLOROTHIAZIDE 25 MG PO TABS
25.0000 mg | ORAL_TABLET | Freq: Every day | ORAL | 1 refills | Status: DC
  Filled 2023-12-11 – 2023-12-27 (×2): qty 30, 30d supply, fill #0
  Filled 2024-01-23: qty 30, 30d supply, fill #1

## 2023-12-11 MED ORDER — AMLODIPINE BESYLATE 10 MG PO TABS
10.0000 mg | ORAL_TABLET | Freq: Every day | ORAL | 1 refills | Status: AC
  Filled 2023-12-11: qty 30, 30d supply, fill #0
  Filled 2024-01-01 (×2): qty 90, 90d supply, fill #0
  Filled 2024-01-23: qty 30, 30d supply, fill #1

## 2023-12-11 MED ORDER — OZEMPIC (2 MG/DOSE) 8 MG/3ML ~~LOC~~ SOPN
2.0000 mg | PEN_INJECTOR | SUBCUTANEOUS | 1 refills | Status: AC
  Filled 2023-12-11 – 2024-01-01 (×3): qty 3, 28d supply, fill #0
  Filled 2024-01-23: qty 3, 28d supply, fill #1
  Filled 2024-02-25 – 2024-02-26 (×2): qty 3, 28d supply, fill #2

## 2023-12-11 MED ORDER — ATORVASTATIN CALCIUM 80 MG PO TABS
80.0000 mg | ORAL_TABLET | Freq: Every day | ORAL | 1 refills | Status: AC
  Filled 2023-12-11 – 2024-01-23 (×3): qty 30, 30d supply, fill #0
  Filled 2024-02-25: qty 30, 30d supply, fill #1

## 2023-12-11 MED ORDER — LISINOPRIL 40 MG PO TABS
40.0000 mg | ORAL_TABLET | Freq: Every day | ORAL | 1 refills | Status: AC
Start: 2023-12-11 — End: ?
  Filled 2023-12-11 – 2023-12-27 (×2): qty 30, 30d supply, fill #0
  Filled 2024-01-23: qty 30, 30d supply, fill #1
  Filled 2024-02-25: qty 30, 30d supply, fill #2

## 2023-12-11 NOTE — Assessment & Plan Note (Signed)
 Blood pressure is well controlled with current medication regimen. - Continue current antihypertensive medications: amlodipine , hydrochlorothiazide , lisinopril 

## 2023-12-11 NOTE — Assessment & Plan Note (Signed)
 A1c decreased from 7.6 to 7.2, indicating improvement. Weight is stable with minor fluctuations. Current treatment with Ozempic  is effective, but weight loss is slower than desired. Discussed potential switch to Mounjaro if goals are not met, but current plan is to continue Ozempic  as progress is noted. - Continue Ozempic  2 mg weekly - Monitor A1c and weight - Will consider Mounjaro if A1c and weight plateau

## 2023-12-11 NOTE — Progress Notes (Signed)
 Established patient visit   Patient: Timothy Douglas   DOB: August 11, 1970   53 y.o. Male  MRN: [id number] Visit Date: 12/11/2023  Today's healthcare provider: Jon Eva, MD   Chief Complaint  Patient presents with   Medical Management of Chronic Issues    Pt reports taking medications as prescribed with no symptoms or side effects to report. He does monitor his blood sugar at home, he does not monitor his blood pressure.    Hypertension   Hyperlipidemia   Diabetes   Subjective     HPI     Medical Management of Chronic Issues    Additional comments: Pt reports taking medications as prescribed with no symptoms or side effects to report. He does monitor his blood sugar at home, he does not monitor his blood pressure.       Last edited by Lilian Fitzpatrick, CMA on 12/11/2023  8:14 AM.       Discussed the use of AI scribe software for clinical note transcription with the patient, who gave verbal consent to proceed.  History of Present Illness   Timothy Douglas is a 53 year old male with glucose tolerance disorder, hypertension, hyperlipidemia, and chronic kidney disease stage 3A who presents for chronic follow-up.  His hemoglobin A1c has improved from 7.6% to 7.2%. He finds Ozempic  effective, with some weight loss, and aims to maintain a weight of 200 pounds. He requires medication refills, including atorvastatin , and has changed pharmacies due to issues with reminders.  There is a history of elevated liver function tests. Previous evaluations, including hepatitis and autoimmune panels, were negative, and an ultrasound was normal.         Medications: Outpatient Medications Prior to Visit  Medication Sig   Ascorbic Acid (VITAMIN C PO) Take by mouth daily.   fluticasone (FLONASE) 50 MCG/ACT nasal spray Place 2 sprays into both nostrils daily.   lidocaine  (XYLOCAINE ) 2 % solution Please specify directions, refills and quantity   meclizine  (ANTIVERT ) 25 MG  tablet Take 1 tablet (25 mg total) by mouth every 6 (six) hours as needed.   meclizine  (ANTIVERT ) 25 MG tablet Take 1 tablet (25 mg total) by mouth every 6 (six) hours as needed.   meloxicam  (MOBIC ) 15 MG tablet Take 1 tablet (15 mg total) by mouth daily.   Multiple Vitamin (MULTIVITAMIN) tablet Take 1 tablet by mouth daily.   sildenafil  (VIAGRA ) 100 MG tablet Take 1 tablet (100 mg total) by mouth daily as needed for erectile dysfunction.   tadalafil  (CIALIS ) 20 MG tablet Take 10 mg by mouth daily as needed.   [DISCONTINUED] amLODipine  (NORVASC ) 10 MG tablet TAKE 1 TABLET BY MOUTH EVERY DAY   [DISCONTINUED] atorvastatin  (LIPITOR) 80 MG tablet Take 1 tablet (80 mg total) by mouth daily.   [DISCONTINUED] hydrochlorothiazide  (HYDRODIURIL ) 25 MG tablet Take 1 tablet (25 mg total) by mouth daily.   [DISCONTINUED] lisinopril  (ZESTRIL ) 40 MG tablet Take 1 tablet (40 mg total) by mouth daily.   [DISCONTINUED] Semaglutide , 2 MG/DOSE, (OZEMPIC , 2 MG/DOSE,) 8 MG/3ML SOPN Inject 2 mg into the skin once a week.   [DISCONTINUED] meloxicam  (MOBIC ) 15 MG tablet Take 1 tablet (15 mg total) by mouth daily.   No facility-administered medications prior to visit.    Review of Systems     Objective    BP 131/88   Pulse 84   Ht 6' 3 (1.905 m)   SpO2 100%   BMI 26.80 kg/m    Physical Exam  Vitals reviewed.  Constitutional:      General: He is not in acute distress.    Appearance: Normal appearance. He is not diaphoretic.  HENT:     Head: Normocephalic and atraumatic.  Eyes:     General: No scleral icterus.    Conjunctiva/sclera: Conjunctivae normal.  Cardiovascular:     Rate and Rhythm: Normal rate and regular rhythm.     Heart sounds: Normal heart sounds. No murmur heard. Pulmonary:     Effort: Pulmonary effort is normal. No respiratory distress.     Breath sounds: Normal breath sounds. No wheezing or rhonchi.  Musculoskeletal:     Cervical back: Neck supple.     Right lower leg: No edema.      Left lower leg: No edema.  Lymphadenopathy:     Cervical: No cervical adenopathy.  Skin:    General: Skin is warm and dry.     Findings: No rash.  Neurological:     Mental Status: He is alert and oriented to person, place, and time. Mental status is at baseline.  Psychiatric:        Mood and Affect: Mood normal.        Behavior: Behavior normal.      Results for orders placed or performed in visit on 12/11/23  POCT HgB A1C  Result Value Ref Range   Hemoglobin A1C 7.2 (A) 4.0 - 5.6 %   HbA1c POC (<> result, manual entry)     HbA1c, POC (prediabetic range)     HbA1c, POC (controlled diabetic range)      Assessment & Plan     Problem List Items Addressed This Visit       Cardiovascular and Mediastinum   Hypertension associated with diabetes (HCC)   Blood pressure is well controlled with current medication regimen. - Continue current antihypertensive medications: amlodipine , hydrochlorothiazide , lisinopril       Relevant Medications   amLODipine  (NORVASC ) 10 MG tablet   atorvastatin  (LIPITOR) 80 MG tablet   hydrochlorothiazide  (HYDRODIURIL ) 25 MG tablet   lisinopril  (ZESTRIL ) 40 MG tablet   Semaglutide , 2 MG/DOSE, (OZEMPIC , 2 MG/DOSE,) 8 MG/3ML SOPN   Other Relevant Orders   Comprehensive metabolic panel with GFR     Endocrine   T2DM (type 2 diabetes mellitus) (HCC) - Primary   A1c decreased from 7.6 to 7.2, indicating improvement. Weight is stable with minor fluctuations. Current treatment with Ozempic  is effective, but weight loss is slower than desired. Discussed potential switch to Mounjaro if goals are not met, but current plan is to continue Ozempic  as progress is noted. - Continue Ozempic  2 mg weekly - Monitor A1c and weight - Will consider Mounjaro if A1c and weight plateau      Relevant Medications   atorvastatin  (LIPITOR) 80 MG tablet   lisinopril  (ZESTRIL ) 40 MG tablet   Semaglutide , 2 MG/DOSE, (OZEMPIC , 2 MG/DOSE,) 8 MG/3ML SOPN   Other Relevant Orders    POCT HgB A1C (Completed)   Hyperlipidemia due to type 2 diabetes mellitus (HCC)   Cholesterol levels need re-evaluation. Atorvastatin  is currently being taken. Discussed potential impact of atorvastatin  on liver function and possibility of holding it if liver function tests remain elevated. - Ordered cholesterol panel - Will consider holding atorvastatin  if liver function tests remain elevated      Relevant Medications   amLODipine  (NORVASC ) 10 MG tablet   atorvastatin  (LIPITOR) 80 MG tablet   hydrochlorothiazide  (HYDRODIURIL ) 25 MG tablet   lisinopril  (ZESTRIL ) 40 MG tablet   Semaglutide ,  2 MG/DOSE, (OZEMPIC , 2 MG/DOSE,) 8 MG/3ML SOPN   Other Relevant Orders   Comprehensive metabolic panel with GFR   Lipid panel     Genitourinary   Stage 3a chronic kidney disease (HCC)   Chronic and stable Recheck metabolic panel Avoid nephrotoxic meds       Relevant Medications   lisinopril  (ZESTRIL ) 40 MG tablet   Other Relevant Orders   Comprehensive metabolic panel with GFR   Other Visit Diagnoses       Essential hypertension       Relevant Medications   amLODipine  (NORVASC ) 10 MG tablet   atorvastatin  (LIPITOR) 80 MG tablet   hydrochlorothiazide  (HYDRODIURIL ) 25 MG tablet   lisinopril  (ZESTRIL ) 40 MG tablet     Elevated LFTs       Relevant Orders   NASH FibroSure(R) Plus   Comprehensive metabolic panel with GFR   Ambulatory referral to Gastroenterology         Abnormal liver function tests Liver function tests have been elevated. Previous evaluations including hepatitis and autoimmune tests were negative. Ultrasound was normal. FibroSure test planned to assess for fatty liver disease and fibrosis. Referral to GI considered if liver function remains elevated. - Ordered FibroSure test - Referred to GI if liver function tests remain elevated - Will consider holding atorvastatin  if liver function tests remain elevated       Return in about 3 months (around 03/12/2024) for  chronic disease f/u.       Jon Eva, MD  [facility] [phone] (phone) [phone] (fax)  [facility]

## 2023-12-11 NOTE — Assessment & Plan Note (Signed)
 Chronic and stable Recheck metabolic panel Avoid nephrotoxic meds

## 2023-12-11 NOTE — Assessment & Plan Note (Signed)
 Cholesterol levels need re-evaluation. Atorvastatin  is currently being taken. Discussed potential impact of atorvastatin  on liver function and possibility of holding it if liver function tests remain elevated. - Ordered cholesterol panel - Will consider holding atorvastatin  if liver function tests remain elevated

## 2023-12-12 LAB — NASH FIBROSURE(R) PLUS

## 2023-12-13 LAB — NASH FIBROSURE(R) PLUS
ALPHA 2-MACROGLOBULINS, QN: 433 mg/dL — ABNORMAL HIGH (ref 110–276)
ALT (SGPT) P5P: 256 IU/L — ABNORMAL HIGH (ref 0–55)
AST (SGOT) P5P: 199 IU/L — ABNORMAL HIGH (ref 0–40)
Apolipoprotein A-1: 157 mg/dL (ref 101–178)
Bilirubin, Total: 0.7 mg/dL (ref 0.0–1.2)
Cholesterol, Total: 202 mg/dL — ABNORMAL HIGH (ref 100–199)
Fibrosis Score: 0.93 — ABNORMAL HIGH (ref 0.00–0.21)
GGT: 1547 IU/L (ref 0–65)
Glucose: 115 mg/dL — ABNORMAL HIGH (ref 70–99)
Haptoglobin: 81 mg/dL (ref 29–370)
NASH Score: 0.98 — ABNORMAL HIGH (ref 0.00–0.25)
Steatosis Score: 0.82 — ABNORMAL HIGH (ref 0.00–0.40)
Triglycerides: 90 mg/dL (ref 0–149)

## 2023-12-13 LAB — COMPREHENSIVE METABOLIC PANEL WITH GFR
ALT: 195 IU/L — ABNORMAL HIGH (ref 0–44)
AST: 174 IU/L — ABNORMAL HIGH (ref 0–40)
Albumin: 4.5 g/dL (ref 3.8–4.9)
Alkaline Phosphatase: 348 IU/L — ABNORMAL HIGH (ref 47–123)
BUN/Creatinine Ratio: 14 (ref 9–20)
BUN: 19 mg/dL (ref 6–24)
Bilirubin Total: 1.2 mg/dL (ref 0.0–1.2)
CO2: 22 mmol/L (ref 20–29)
Calcium: 9.9 mg/dL (ref 8.7–10.2)
Chloride: 101 mmol/L (ref 96–106)
Creatinine, Ser: 1.39 mg/dL — ABNORMAL HIGH (ref 0.76–1.27)
Globulin, Total: 3.8 g/dL (ref 1.5–4.5)
Glucose: 113 mg/dL — ABNORMAL HIGH (ref 70–99)
Potassium: 4.9 mmol/L (ref 3.5–5.2)
Sodium: 137 mmol/L (ref 134–144)
Total Protein: 8.3 g/dL (ref 6.0–8.5)
eGFR: 60 mL/min/1.73 (ref >59)

## 2023-12-13 LAB — LIPID PANEL
Chol/HDL Ratio: 2.9 ratio (ref 0.0–5.0)
Cholesterol, Total: 191 mg/dL (ref 100–199)
HDL: 67 mg/dL (ref >39)
LDL Chol Calc (NIH): 109 mg/dL — ABNORMAL HIGH (ref 0–99)
Triglycerides: 81 mg/dL (ref 0–149)
VLDL Cholesterol Cal: 15 mg/dL (ref 5–40)

## 2023-12-15 ENCOUNTER — Ambulatory Visit: Payer: Self-pay | Admitting: Family Medicine

## 2023-12-18 DIAGNOSIS — R7989 Other specified abnormal findings of blood chemistry: Secondary | ICD-10-CM | POA: Diagnosis not present

## 2023-12-19 DIAGNOSIS — R7989 Other specified abnormal findings of blood chemistry: Secondary | ICD-10-CM | POA: Diagnosis not present

## 2023-12-22 ENCOUNTER — Other Ambulatory Visit: Payer: Self-pay | Admitting: Gastroenterology

## 2023-12-22 ENCOUNTER — Other Ambulatory Visit: Payer: Self-pay

## 2023-12-22 DIAGNOSIS — R7989 Other specified abnormal findings of blood chemistry: Secondary | ICD-10-CM

## 2023-12-27 ENCOUNTER — Other Ambulatory Visit: Payer: Self-pay

## 2024-01-01 ENCOUNTER — Other Ambulatory Visit: Payer: Self-pay

## 2024-01-11 ENCOUNTER — Ambulatory Visit

## 2024-01-16 DIAGNOSIS — R7989 Other specified abnormal findings of blood chemistry: Secondary | ICD-10-CM | POA: Diagnosis not present

## 2024-01-23 ENCOUNTER — Other Ambulatory Visit: Payer: Self-pay

## 2024-01-26 ENCOUNTER — Other Ambulatory Visit: Payer: Self-pay

## 2024-01-28 ENCOUNTER — Other Ambulatory Visit: Payer: Self-pay

## 2024-01-28 MED ORDER — PREDNISONE 10 MG PO TABS
40.0000 mg | ORAL_TABLET | Freq: Every day | ORAL | 0 refills | Status: DC
  Filled 2024-01-28: qty 112, 28d supply, fill #0

## 2024-01-28 NOTE — Progress Notes (Signed)
 "    Timothy Kung MD, MRCP(U.K) [address]  [place], [place] [address]  Main: [phone] Fax: [phone]  Primary Care Physician: Practice, [facility]  Virtual Visit via Video Note    I discussed the limitations, risks, security and privacy concerns of performing an evaluation and management service by video  and the availability of in person appointments. I also discussed with the patient that there may be a patient responsible charge related to this service. The patient expressed understanding and agreed to proceed.  Location of Patient: Home Location of Provider: Home Persons involved: Patient and provider only   History of Present Illness: Discuss Results   HPI: Timothy Douglas is a 53 y.o. male   Summary of history :   Initially referred and seen on 12/18/2023 for abnormal liver function test.  No family history of liver abnormalities does not drink any excess alcohol, no illegal drug use no tattoos no military service no blood transfusion.  No over-the-counter herbal supplements or medications on the Internet.  Only new medication that he has started recently is Ozempic  and he has lost 40 to 50 pounds from that.  He does take meloxicam  1 tablet every single day does not take Tylenol  on a regular basis.  No other complaints otherwise presently.   06/24/2023: RUQ USG: normal  09/09/2023: Hep C ab , HIV, CK, ANA, Hep Bc ab - negative. Hep B s ab - reactive 12/11/2023: Alk phos 348 , ALT 195 , AST 174, Tbil 1.2 . Hba1c- 7. 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  Liver biopsy was performed to further evaluate abnormal liver function tests.  The patient was informed of the results and the diagnosis provided by the clinician.  Health maintenance topics were reviewed, including pneumococcal vaccination, shingles vaccination status, hepatitis B immunity, and plans to discuss hepatitis A vaccination at the next visit.    12/18/2023 : ANA+ve , 1: 160 ,inr 1.1, AMA negative, IGG 2215 , GGT 1375 , smooth muscle ab 73 , Hbsag-ve   01/16/2024: Liver bx :  The most striking finding in this biopsy is the presence of dense plasma cell-rich inflammatory infiltrates involving every portal tract with associated interface activity in the periportal lobular parenchyma, but without overt necrosis. The native bile ducts are intact. Beyond the periportal lobular interface activity, the parenchyma is unremarkable, of note with no steatosis and no hepatocyte ballooning. The trichrome and reticulin stains highlight the presence of patchy portal and periportal fibrosis with patchy bridging fibrosis (fibrosis stage 3). The iron stain is negative. The PAS/D stain is negative for alpha-1-antitrypsin globules.   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 There are no features of steatotic liver disease.   Current Outpatient Medications  Medication Sig Dispense Refill   amitriptyline (ELAVIL) 25 MG tablet Take 25 mg by mouth nightly. (Patient not taking: Reported on 12/18/2023)     amLODIPine  (NORVASC ) 10 MG tablet Take 10 mg by mouth once daily.     cyclobenzaprine  (FLEXERIL ) 10 MG tablet Take 10 mg by mouth 3 (three) times daily as needed for Muscle spasms. (Patient not taking:  Reported on 12/18/2023)     dapagliflozin (FARXIGA) 10 mg tablet Take 10 mg by mouth every morning. (Patient not taking: Reported on 12/18/2023)     DULoxetine (CYMBALTA) 60 MG DR capsule Take 60 mg by mouth once daily. (Patient not taking: Reported on 12/18/2023)     glimepiride (AMARYL) 4 MG tablet Take 4 mg by  mouth 2 (two) times daily. (Patient not taking: Reported on 12/18/2023)     indomethacin  (INDOCIN ) 25 MG capsule Take 25 mg by mouth 2 (two) times daily with meals.     lisinopril  (PRINIVIL ,ZESTRIL ) 40 MG tablet Take 40 mg by mouth once daily.     meloxicam  (MOBIC ) 15 MG tablet Take 15 mg by mouth once daily     methocarbamol (ROBAXIN) 500 MG tablet Take 500 mg by mouth 3 (three) times daily. (Patient not taking: Reported on 12/18/2023)     multivitamin tablet Take 1 tablet by mouth once daily     naproxen (EC NAPROSYN) 500 MG EC tablet Take 500 mg by mouth 2 (two) times daily with meals. (Patient not taking: Reported on 12/18/2023)     OZEMPIC  2 mg/dose (8 mg/3 mL) pen injector Inject 2 mg subcutaneously every 7 (seven) days     pregabalin (LYRICA) 150 MG capsule Take 150 mg by mouth 2 (two) times daily. (Patient not taking: Reported on 12/18/2023)     saxagliptin-metformin  2.5-1,000 mg Take by mouth once daily. (Patient not taking: Reported on 12/18/2023)     tiZANidine (ZANAFLEX) 4 MG capsule Take 4 mg by mouth 3 (three) times daily. (Patient not taking: Reported on 12/18/2023)     No current facility-administered medications for this visit.    Allergies as of 01/28/2024 - Reviewed 01/16/2024  Allergen Reaction Noted   Other Rash 02/05/2019   Adhesive Other (See Comments) 02/05/2019   Adhesive tape-silicones Rash 02/05/2019    Review of Systems:    All systems reviewed and negative except where noted in HPI.  General Appearance:    Alert, cooperative, no distress, appears stated age Head:    Normocephalic, without obvious abnormality, atraumatic Eyes:    PERRL, conjunctiva/corneas clear, Ears:    Grossly normal hearing   Neurologic:  Grossly normal   Observations/Objective:  Labs: CMP     Component Value Date/Time   NA 135 12/15/2013 1409   K 5.1 (H) 12/15/2013 1409   CL 101 12/15/2013 1409   CL 102 12/23/2007 1209   CO2 24 12/15/2013 1409   GLUCOSE 197 (H)  12/15/2013 1409   BUN 25 (H) 12/15/2013 1409   BUN 19 (H) 12/23/2007 1209   CREATININE 1.9 (H) 12/15/2013 1409   CALCIUM  10.8 (H) 12/15/2013 1409   AST 48 (H) 12/15/2013 1409   ALT 38 12/15/2013 1409   ALKPHOS 106 12/15/2013 1409   Lab Results  Component Value Date   WBC 8.8 12/18/2023   HGB 14.8 12/18/2023   HCT 39.9 (L) 12/18/2023   MCV 80.8 12/18/2023   PLT 213 12/18/2023    Imaging Studies: US  liver biopsy Result Date: 01/20/2024 Procedure: Ultrasound guided random liver core biopsy Pre-procedure diagnosis: Evaluation for autoimmune hepatitis Post-procedure diagnosis: Same. Comparison(s): None available Attending physician: Dr.  Toya Irving Resident or Fellow Physician: Dr. Nicholas Iarrobino Local anesthesia: Lidocaine  2% subdermal Moderate sedation: The physician who performed the diagnostic/therapeutic procedure provided 6 minutes of face to face moderate sedation services for this procedure with the assistance of an independent trained observer who had no other duties during the procedures.  Agents:  50 mcg Fentanyl IV and 1 mg Versed IV Complications: None. EBL:   Minimal.    Consent: The risks, benefits, and alternatives to the procedure were explained to the patient including, but not limited to, bleeding, infection, injury to adjacent organs and structures, and pain, and the patient gives informed written consent for the procedure. Time-out: Prior to the start of the procedure, a timeout was performed in which the patient's identity, site of procedure, and type of procedure was verified. Technique and Findings: The patient was placed in a supine position and a limited ultrasound of the liver was performed. An approach was chosen for biopsy of the left hepatic lobe. The overlying skin was prepped and draped in sterile fashion.  Local anesthesia was obtained with 2% lidocaine . An 18 gauge core needle biopsy system was used.  A total of 2 passes were made into the liver. The  samples were sent to pathology for analysis. Impression: Ultrasound guided random core biopsy of the liver. No immediate complications. Dr. Toya Irving was present for the entire procedure. Electronically Reviewed by:  Mabel Satterfield, MD, [facility] Electronically Reviewed on:  01/16/2024 1:10 PM I have reviewed the images and concur with the above findings. Electronically Signed by:  Toya Irving, MD, [facility] Electronically Signed on:  01/20/2024 6:58 AM   Assessment and Plan:  Ousman Dise is a 53 y.o. y/o male here to follow up for abnormal LFT's .   Assessment & Plan Autoimmune hepatitis with F3 fibrosis Biopsy-confirmed advanced liver disease with F3 fibrosis, not yet cirrhotic, requiring immunosuppression to prevent progression.  Emphasized importance of health maintenance interventions.  - Commenced prednisone  40 mg orally once daily for induction of remission. - Ordered laboratory testing for thiopurine methyltransferase (TPMT) activity to assess azathioprine metabolism. - Ordered TB quantiferon testing prior to immunosuppression. - Planned to assess response to prednisone  after two weeks and discuss initiation of azathioprine at next visit. - Planned to repeat blood work (aminotransferases, IgG, GGT, AST, ALT) every two weeks to monitor biochemical response to steroids. - Planned to taper prednisone  to 20 mg daily, then by 2.5-5 mg every 2-4 weeks to a maintenance dose of 5-10 mg daily once normalization of aminotransferases and IgG is achieved. - Planned to escalate azathioprine after stable remission and consider steroid withdrawal if possible. - Ordered DEXA bone scan for baseline bone health assessment. - Planned annual dermatologic exams due to increased risk of skin cancer with azathioprine. - Planned close monitoring of blood work for development of cirrhosis and early referral to transplant center if indicated. - Recommended pneumococcal vaccination. -  Planned to discuss hepatitis A vaccination at next visit. - Planned to obtain testing for celiac disease and thyroid disease at next visit. - Provided counseling regarding sun protection, weight-bearing exercise, and smoking cessation. - Planned to discuss weight management at next in-person visit.    Video visit in 2 weeks after lab tests   I discussed the assessment and treatment plan with the patient. The patient was provided an opportunity to ask questions and all were answered. The patient agreed with the plan and demonstrated an understanding of the instructions.   The patient was advised to call back or seek an in-person evaluation if the symptoms worsen or if the condition fails to improve as anticipated.  I provided 20 minutes of face-to-face time during this encounter.  Dr Timothy Kung MD,MRCP [facility]) Gastroenterology/Hepatology   This video encounter was conducted with the patient's (or proxy's) verbal consent  via secure, interactive audio and video telecommunications while in clinic/office/hospital.  The patient (or proxy) was instructed to have this encounter in a suitably private space and to only have persons present to whom they give permission to participate. In addition, patient identity was confirmed by use of name plus an additional identifier.  This visit was coded based on medical decision making (MDM).   "

## 2024-01-30 ENCOUNTER — Other Ambulatory Visit: Payer: Self-pay

## 2024-02-24 ENCOUNTER — Observation Stay
Admission: EM | Admit: 2024-02-24 | Discharge: 2024-02-25 | Disposition: A | Attending: Family Medicine | Admitting: Family Medicine

## 2024-02-24 ENCOUNTER — Other Ambulatory Visit: Payer: Self-pay

## 2024-02-24 ENCOUNTER — Encounter: Payer: Self-pay | Admitting: Family Medicine

## 2024-02-24 DIAGNOSIS — I10 Essential (primary) hypertension: Secondary | ICD-10-CM | POA: Diagnosis not present

## 2024-02-24 DIAGNOSIS — R7989 Other specified abnormal findings of blood chemistry: Secondary | ICD-10-CM

## 2024-02-24 DIAGNOSIS — E1169 Type 2 diabetes mellitus with other specified complication: Secondary | ICD-10-CM | POA: Insufficient documentation

## 2024-02-24 DIAGNOSIS — E875 Hyperkalemia: Secondary | ICD-10-CM | POA: Diagnosis not present

## 2024-02-24 DIAGNOSIS — E785 Hyperlipidemia, unspecified: Secondary | ICD-10-CM | POA: Diagnosis not present

## 2024-02-24 DIAGNOSIS — R739 Hyperglycemia, unspecified: Secondary | ICD-10-CM | POA: Diagnosis present

## 2024-02-24 DIAGNOSIS — Z79899 Other long term (current) drug therapy: Secondary | ICD-10-CM | POA: Diagnosis not present

## 2024-02-24 DIAGNOSIS — E1100 Type 2 diabetes mellitus with hyperosmolarity without nonketotic hyperglycemic-hyperosmolar coma (NKHHC): Secondary | ICD-10-CM | POA: Diagnosis not present

## 2024-02-24 DIAGNOSIS — E1165 Type 2 diabetes mellitus with hyperglycemia: Secondary | ICD-10-CM | POA: Diagnosis present

## 2024-02-24 DIAGNOSIS — E860 Dehydration: Secondary | ICD-10-CM | POA: Diagnosis not present

## 2024-02-24 DIAGNOSIS — R945 Abnormal results of liver function studies: Secondary | ICD-10-CM | POA: Insufficient documentation

## 2024-02-24 DIAGNOSIS — N179 Acute kidney failure, unspecified: Secondary | ICD-10-CM | POA: Diagnosis not present

## 2024-02-24 DIAGNOSIS — E871 Hypo-osmolality and hyponatremia: Secondary | ICD-10-CM

## 2024-02-24 LAB — CBC WITH DIFFERENTIAL/PLATELET
Abs Immature Granulocytes: 0.06 10*3/uL (ref 0.00–0.07)
Basophils Absolute: 0 10*3/uL (ref 0.0–0.1)
Basophils Relative: 0 %
Eosinophils Absolute: 0 10*3/uL (ref 0.0–0.5)
Eosinophils Relative: 0 %
HCT: 43.3 % (ref 39.0–52.0)
Hemoglobin: 15.8 g/dL (ref 13.0–17.0)
Immature Granulocytes: 0 %
Lymphocytes Relative: 13 %
Lymphs Abs: 1.9 10*3/uL (ref 0.7–4.0)
MCH: 28.9 pg (ref 26.0–34.0)
MCHC: 36.5 g/dL — ABNORMAL HIGH (ref 30.0–36.0)
MCV: 79.2 fL — ABNORMAL LOW (ref 80.0–100.0)
Monocytes Absolute: 0.5 10*3/uL (ref 0.1–1.0)
Monocytes Relative: 4 %
Neutro Abs: 11.6 10*3/uL — ABNORMAL HIGH (ref 1.7–7.7)
Neutrophils Relative %: 83 %
Platelets: 207 10*3/uL (ref 150–400)
RBC: 5.47 MIL/uL (ref 4.22–5.81)
RDW: 12.6 % (ref 11.5–15.5)
WBC: 14 10*3/uL — ABNORMAL HIGH (ref 4.0–10.5)
nRBC: 0 % (ref 0.0–0.2)

## 2024-02-24 LAB — CBG MONITORING, ED
Glucose-Capillary: >600 mg/dL (ref 70–99)
Glucose-Capillary: 475 mg/dL — ABNORMAL HIGH (ref 70–99)
Glucose-Capillary: 373 mg/dL — ABNORMAL HIGH (ref 70–99)
Glucose-Capillary: 172 mg/dL — ABNORMAL HIGH (ref 70–99)
Glucose-Capillary: 184 mg/dL — ABNORMAL HIGH (ref 70–99)

## 2024-02-24 LAB — CBC
HCT: 42.9 % (ref 39.0–52.0)
Hemoglobin: 16.1 g/dL (ref 13.0–17.0)
MCH: 29.4 pg (ref 26.0–34.0)
MCHC: 37.5 g/dL — ABNORMAL HIGH (ref 30.0–36.0)
MCV: 78.3 fL — ABNORMAL LOW (ref 80.0–100.0)
Platelets: 204 10*3/uL (ref 150–400)
RBC: 5.48 MIL/uL (ref 4.22–5.81)
RDW: 12.7 % (ref 11.5–15.5)
WBC: 16.2 10*3/uL — ABNORMAL HIGH (ref 4.0–10.5)
nRBC: 0 % (ref 0.0–0.2)

## 2024-02-24 LAB — BASIC METABOLIC PANEL WITH GFR
Anion gap: 15 (ref 5–15)
BUN: 46 mg/dL — ABNORMAL HIGH (ref 6–20)
CO2: 19 mmol/L — ABNORMAL LOW (ref 22–32)
Calcium: 9.3 mg/dL (ref 8.9–10.3)
Chloride: 91 mmol/L — ABNORMAL LOW (ref 98–111)
Creatinine, Ser: 1.93 mg/dL — ABNORMAL HIGH (ref 0.61–1.24)
GFR, Estimated: 40 mL/min — ABNORMAL LOW
Glucose, Bld: 458 mg/dL — ABNORMAL HIGH (ref 70–99)
Potassium: 5 mmol/L (ref 3.5–5.1)
Sodium: 125 mmol/L — ABNORMAL LOW (ref 135–145)

## 2024-02-24 LAB — COMPREHENSIVE METABOLIC PANEL WITH GFR
ALT: 76 U/L — ABNORMAL HIGH (ref 0–44)
AST: 44 U/L — ABNORMAL HIGH (ref 15–41)
Albumin: 4.3 g/dL (ref 3.5–5.0)
Alkaline Phosphatase: 217 U/L — ABNORMAL HIGH (ref 38–126)
Anion gap: 15 (ref 5–15)
BUN: 49 mg/dL — ABNORMAL HIGH (ref 6–20)
CO2: 18 mmol/L — ABNORMAL LOW (ref 22–32)
Calcium: 9.5 mg/dL (ref 8.9–10.3)
Chloride: 84 mmol/L — ABNORMAL LOW (ref 98–111)
Creatinine, Ser: 1.94 mg/dL — ABNORMAL HIGH (ref 0.61–1.24)
GFR, Estimated: 40 mL/min — ABNORMAL LOW
Glucose, Bld: 842 mg/dL (ref 70–99)
Potassium: 6 mmol/L — ABNORMAL HIGH (ref 3.5–5.1)
Sodium: 118 mmol/L — CL (ref 135–145)
Total Bilirubin: 0.9 mg/dL (ref 0.0–1.2)
Total Protein: 7.7 g/dL (ref 6.5–8.1)

## 2024-02-24 LAB — URINALYSIS, W/ REFLEX TO CULTURE (INFECTION SUSPECTED)
Bacteria, UA: NONE SEEN
Bilirubin Urine: NEGATIVE
Glucose, UA: >=500 mg/dL — AB
Hgb urine dipstick: NEGATIVE
Ketones, ur: NEGATIVE mg/dL
Leukocytes,Ua: NEGATIVE
Nitrite: NEGATIVE
Protein, ur: NEGATIVE mg/dL
RBC / HPF: 0 RBC/hpf (ref 0–5)
Specific Gravity, Urine: 1.023 (ref 1.005–1.030)
Squamous Epithelial / HPF: 0 /HPF (ref 0–5)
pH: 5 (ref 5.0–8.0)

## 2024-02-24 LAB — BETA-HYDROXYBUTYRIC ACID
Beta-Hydroxybutyric Acid: 0.27 mmol/L (ref 0.05–0.27)
Beta-Hydroxybutyric Acid: 0.11 mmol/L (ref 0.05–0.27)

## 2024-02-24 LAB — BLOOD GAS, VENOUS
Acid-base deficit: 1.1 mmol/L (ref 0.0–2.0)
Bicarbonate: 23.5 mmol/L (ref 20.0–28.0)
O2 Saturation: 77.8 %
Patient temperature: 37
pCO2, Ven: 38 mmHg — ABNORMAL LOW (ref 44–60)
pH, Ven: 7.4 (ref 7.25–7.43)
pO2, Ven: 44 mmHg (ref 32–45)

## 2024-02-24 LAB — OSMOLALITY: Osmolality: 315 mosm/kg — ABNORMAL HIGH (ref 275–295)

## 2024-02-24 MED ORDER — TRAZODONE HCL 50 MG PO TABS: 25.0000 mg | ORAL_TABLET | Freq: Every evening | ORAL | Status: DC | PRN

## 2024-02-24 MED ORDER — LACTATED RINGERS IV SOLN: INTRAVENOUS | Status: DC

## 2024-02-24 MED ORDER — MAGNESIUM HYDROXIDE 400 MG/5ML PO SUSP: 30.0000 mL | Freq: Every day | ORAL | Status: DC | PRN

## 2024-02-24 MED ORDER — SODIUM CHLORIDE 0.9 % IV BOLUS
1000.0000 mL | Freq: Once | INTRAVENOUS | Status: AC
  Administered 2024-02-24: 1000 mL via INTRAVENOUS

## 2024-02-24 MED ORDER — DEXTROSE IN LACTATED RINGERS 5 % IV SOLN: INTRAVENOUS | Status: DC

## 2024-02-24 MED ORDER — ACETAMINOPHEN 325 MG PO TABS: 650.0000 mg | ORAL_TABLET | Freq: Four times a day (QID) | ORAL | Status: DC | PRN

## 2024-02-24 MED ORDER — ONDANSETRON HCL 4 MG PO TABS: 4.0000 mg | ORAL_TABLET | Freq: Four times a day (QID) | ORAL | Status: DC | PRN

## 2024-02-24 MED ORDER — ACETAMINOPHEN 650 MG RE SUPP: 650.0000 mg | Freq: Four times a day (QID) | RECTAL | Status: DC | PRN

## 2024-02-24 MED ORDER — ENOXAPARIN SODIUM 40 MG/0.4ML IJ SOSY
40.0000 mg | PREFILLED_SYRINGE | INTRAMUSCULAR | Status: DC
  Administered 2024-02-24: 40 mg via SUBCUTANEOUS
  Filled 2024-02-24: qty 0.4

## 2024-02-24 MED ORDER — ONDANSETRON HCL 4 MG/2ML IJ SOLN: 4.0000 mg | Freq: Four times a day (QID) | INTRAMUSCULAR | Status: DC | PRN

## 2024-02-24 MED ORDER — POTASSIUM CHLORIDE 10 MEQ/100ML IV SOLN: 10.0000 meq | INTRAVENOUS | Status: DC

## 2024-02-24 MED ORDER — INSULIN ASPART 100 UNIT/ML IJ SOLN
9.0000 [IU] | Freq: Once | INTRAMUSCULAR | Status: AC
  Administered 2024-02-24: 9 [IU] via INTRAVENOUS
  Filled 2024-02-24: qty 9

## 2024-02-24 MED ORDER — INSULIN ASPART 100 UNIT/ML IJ SOLN
0.0000 [IU] | INTRAMUSCULAR | Status: DC
  Administered 2024-02-24 – 2024-02-25 (×2): 4 [IU] via SUBCUTANEOUS
  Administered 2024-02-25: 15 [IU] via SUBCUTANEOUS
  Filled 2024-02-24: qty 4
  Filled 2024-02-24: qty 15
  Filled 2024-02-24: qty 4

## 2024-02-24 MED ORDER — DEXTROSE 50 % IV SOLN: 0.0000 mL | INTRAVENOUS | Status: DC | PRN

## 2024-02-24 MED ORDER — INSULIN REGULAR(HUMAN) IN NACL 100-0.9 UT/100ML-% IV SOLN
INTRAVENOUS | Status: DC
  Administered 2024-02-24: 17 [IU]/h via INTRAVENOUS
  Filled 2024-02-24: qty 100

## 2024-02-24 MED ORDER — CALCIUM GLUCONATE-NACL 1-0.675 GM/50ML-% IV SOLN
1.0000 g | Freq: Once | INTRAVENOUS | Status: AC
  Administered 2024-02-24: 1000 mg via INTRAVENOUS
  Filled 2024-02-24: qty 50

## 2024-02-24 MED ORDER — LACTATED RINGERS IV BOLUS
20.0000 mL/kg | Freq: Once | INTRAVENOUS | Status: AC
  Administered 2024-02-24: 1796 mL via INTRAVENOUS

## 2024-02-24 NOTE — ED Notes (Signed)
 Dr Clarine notified of Na+ 118. Orders to be placed as needed.

## 2024-02-24 NOTE — ED Notes (Signed)
 Dr Clarine notified of glucose 842. Orders to be placed as needed

## 2024-02-24 NOTE — ED Provider Notes (Signed)
 [facility] Provider Note    Event Date/Time   First MD Initiated Contact with Patient 02/24/24 1839     (approximate)   History   Hyperglycemia and Abnormal Lab   HPI  Timothy Douglas is a 54 y.o. male with history of diabetes, hyperlipidemia, hypertension, CKD, presenting with abnormal labs.  Will send in by GI office.  Had routine blood work done today, was found to be hyperglycemic, hyperkalemic, with an AKI as well as hyponatremic.  Is on Ozempic .  Does endorse polyuria and polydipsia.  He denies any infectious symptoms.  No lightheadedness.  States that he is still on his prednisone  taper.  He might be a little bit dehydrated.  Per independent history from wife, patient has been eating a lot of fruit lately, no altered mental status, assessment of baseline.  On independent review, he was seen by GI in December, was referred for abnormal liver function test.  Denies excessive alcohol use, was started on Ozempic .  Does have history of autoimmune hepatitis and fibrosis.  He was started on prednisone  with a taper.     Physical Exam   Triage Vital Signs: ED Triage Vitals [02/24/24 1620]  Encounter Vitals Group     BP (!) 150/87     Girls Systolic BP Percentile      Girls Diastolic BP Percentile      Boys Systolic BP Percentile      Boys Diastolic BP Percentile      Pulse Rate (!) 102     Resp 17     Temp 98.5 F (36.9 C)     Temp Source Oral     SpO2 96 %     Weight 198 lb (89.8 kg)     Height 6' 3 (1.905 m)     Head Circumference      Peak Flow      Pain Score 0     Pain Loc      Pain Education      Exclude from Growth Chart     Most recent vital signs: Vitals:   02/24/24 1620  BP: (!) 150/87  Pulse: (!) 102  Resp: 17  Temp: 98.5 F (36.9 C)  SpO2: 96%     General: Awake, no distress.  CV:  Good peripheral perfusion.  Resp:  Normal effort.  Abd:  No distention.  Soft nontender Other:  No lower extremity edema, nontoxic  appearing, mildly dry mucous membranes   ED Results / Procedures / Treatments   Labs (all labs ordered are listed, but only abnormal results are displayed) Labs Reviewed  CBC WITH DIFFERENTIAL/PLATELET - Abnormal; Notable for the following components:      Result Value   WBC 14.0 (*)    MCV 79.2 (*)    MCHC 36.5 (*)    Neutro Abs 11.6 (*)    All other components within normal limits  COMPREHENSIVE METABOLIC PANEL WITH GFR - Abnormal; Notable for the following components:   Sodium 118 (*)    Potassium 6.0 (*)    Chloride 84 (*)    CO2 18 (*)    Glucose, Bld 842 (*)    BUN 49 (*)    Creatinine, Ser 1.94 (*)    AST 44 (*)    ALT 76 (*)    Alkaline Phosphatase 217 (*)    GFR, Estimated 40 (*)    All other components within normal limits  BLOOD GAS, VENOUS - Abnormal; Notable for the following  components:   pCO2, Ven 38 (*)    All other components within normal limits  CBG MONITORING, ED - Abnormal; Notable for the following components:   Glucose-Capillary >600 (*)    All other components within normal limits  BETA-HYDROXYBUTYRIC ACID  OSMOLALITY  URINALYSIS, W/ REFLEX TO CULTURE (INFECTION SUSPECTED)     PROCEDURES:  Critical Care performed: No  .Critical Care  Performed by: Waymond Lorelle Cummins, MD Authorized by: Waymond Lorelle Cummins, MD   Critical care provider statement:    Critical care time (minutes):  40   Critical care was time spent personally by me on the following activities:  Development of treatment plan with patient or surrogate, discussions with consultants, evaluation of patient's response to treatment, examination of patient, ordering and review of laboratory studies, ordering and review of radiographic studies, ordering and performing treatments and interventions, pulse oximetry, re-evaluation of patient's condition and review of old charts    MEDICATIONS ORDERED IN ED: Medications  sodium chloride  0.9 % bolus 1,000 mL (1,000 mLs Intravenous New Bag/Given 02/24/24  1931)  insulin  aspart (novoLOG ) injection 9 Units (9 Units Intravenous Given 02/24/24 1935)     IMPRESSION / MDM / ASSESSMENT AND PLAN / ED COURSE  I reviewed the triage vital signs and the nursing notes.                              Differential diagnosis includes, but is not limited to, to consider DKA, HHS but he is not altered, hyperglycemia, electrolyte derangements, dehydration.  Labs, UA.  IV fluids.  Patient's presentation is most consistent with acute presentation with potential threat to life or bodily function.  Independent interpretation of labs and imaging below.  Given his electrolyte derangements, AKI, dehydration, hyperglycemia suspect in the setting of steroid use, he will need to be admitted for further management.  Consult the hospitalist.  He is admitted.   Clinical Course as of 02/24/24 1948  Tue Feb 24, 2024  8095 Independent review of labs, beta-hydroxybutyrate is not elevated, he does have a mild acidosis without anion gap, he is hyponatremic, hyperglycemic, AKI, hyperkalemic, LFTs are mildly elevated, T. bili is normal, he is leukocytosis without infectious symptoms.  With glucose correction, corrected sodium is 136. [TT]    Clinical Course User Index [TT] Waymond, Lorelle Cummins, MD     FINAL CLINICAL IMPRESSION(S) / ED DIAGNOSES   Final diagnoses:  Hyperkalemia  Hyperglycemia  AKI (acute kidney injury)  Dehydration  Hyponatremia  Elevated LFTs     Rx / DC Orders   ED Discharge Orders     None        Note:  This document was prepared using Dragon voice recognition software and may include unintentional dictation errors.    Waymond Lorelle Cummins, MD 02/24/24 [phone]

## 2024-02-24 NOTE — ED Triage Notes (Signed)
 Pt sent by GI doctor due to abnormal labs. Blood work this morning was Na 118, glucose 932, K 5.6, creat 2.2, BUN 45. Pt had type 2 diabetes and takes ozempic . Pt endorses polyuria and polydipsia.

## 2024-02-24 NOTE — H&P (Signed)
 "     [place]   PATIENT NAME: Timothy Douglas    MR#:  [id number]  DATE OF BIRTH:  [birth year]  DATE OF ADMISSION:  02/24/2024  PRIMARY CARE PHYSICIAN: Myrla Jon HERO, MD   Patient is coming from: Home  REQUESTING/REFERRING PHYSICIAN: Waymond Lorelle Cummins, MD   CHIEF COMPLAINT:   Chief Complaint  Patient presents with   Hyperglycemia   Abnormal Lab    HISTORY OF PRESENT ILLNESS:  Timothy Douglas is a 54 y.o. African-American male with medical history significant for essential hypertension type 2 diabetes mellitus and DJD hyperglycemia while having outpatient labs.  The patient has been having polyuria and polydipsia.  He denied any nausea or vomiting or diarrhea or abdominal pain.  No cough or wheezing or dyspnea.  No chest pain or palpitations.  No fever or chills.  No dysuria, oliguria or hematuria or flank pain.  He has been compliant with his diabetic regimen.  ED Course: When the patient came to the ER, BP was 150/87 with heart rate 102 with otherwise normal vital signs.  Labs revealed hyponatremia 118 and hypochloremia of 84 with blood glucose of 842, BUN of 49 and creatinine 1.94 above previous levels.  LFTs showed alk phos 217 AST 44 ALT 76, still better than in 11/25.  CBC showed leukocytosis of 14 with neutrophilia.  Serum osmolality with 315.  Beta-hydroxybutyrate was normal at 0.12 as well as anion gap. EKG as reviewed by me : EKG showed normal sinus rhythm with a rate of 96 with left posterior fascicle block and T wave inversion inferiorly. Imaging: None.  The patient was given 1 L bolus of IV normal saline and 9 units of IV insulin  as well as IV lactated ringer  bolus.  The patient was initially expected to be admitted to stepdown unit bed on IV insulin  drip per Endo tool and after improvement of blood glucose measures he will be placed in observation on telemetry bed. PAST MEDICAL HISTORY:   Past Medical History:  Diagnosis Date   DJD (degenerative joint disease)     Essential hypertension    Hyperlipidemia due to type 2 diabetes mellitus (HCC)    Shoulder pain    T2DM (type 2 diabetes mellitus) (HCC)    Vertigo    1 episode over 10 yrs ago    PAST SURGICAL HISTORY:   Past Surgical History:  Procedure Laterality Date   APPENDECTOMY     COLONOSCOPY WITH PROPOFOL  N/A 03/19/2019   Procedure: COLONOSCOPY WITH BIOPSY;  Surgeon: Jinny Carmine, MD;  Location: [facility];  Service: Endoscopy;  Laterality: N/A;  Diabetic - oral meds Priority 4   COLONOSCOPY WITH PROPOFOL  N/A 03/22/2022   Procedure: COLONOSCOPY WITH PROPOFOL ;  Surgeon: Jinny Carmine, MD;  Location: [facility];  Service: Endoscopy;  Laterality: N/A;  Diabetic   EYE SURGERY     KELOID EXCISION  1990s   KNEE ARTHROSCOPY Left    LUMBAR DISC SURGERY     LUMBAR FUSION     L5-S1   POLYPECTOMY N/A 03/19/2019   Procedure: POLYPECTOMY;  Surgeon: Jinny Carmine, MD;  Location: [facility];  Service: Endoscopy;  Laterality: N/A;   POLYPECTOMY  03/22/2022   Procedure: POLYPECTOMY;  Surgeon: Jinny Carmine, MD;  Location: [facility];  Service: Endoscopy;;   SPINAL CORD STIMULATOR IMPLANT     SPINE SURGERY  06/07    SOCIAL HISTORY:   Social History   Tobacco Use   Smoking status: Never   Smokeless  tobacco: Never  Substance Use Topics   Alcohol use: Not Currently    Alcohol/week: 2.0 - 3.0 standard drinks of alcohol    Types: 1 - 2 Glasses of wine, 1 Cans of beer per week    FAMILY HISTORY:   Family History  Problem Relation Age of Onset   Hypertension Mother    Bell's palsy Mother    Diabetes Mother    Hypertension Father    Thyroid disease Sister    Thyroid disease Brother    Leukemia Maternal Uncle    Diabetes Maternal Grandmother    Leukemia Maternal Grandfather    Diabetes Paternal Grandmother    Congestive Heart Failure Paternal Great-grandfather    Breast cancer Neg Hx    Prostate cancer Neg Hx    Colon cancer Neg Hx     DRUG  ALLERGIES:  Allergies[1]  REVIEW OF SYSTEMS:   ROS As per history of present illness. All pertinent systems were reviewed above. Constitutional, HEENT, cardiovascular, respiratory, GI, GU, musculoskeletal, neuro, psychiatric, endocrine, integumentary and hematologic systems were reviewed and are otherwise negative/unremarkable except for positive findings mentioned above in the HPI.   MEDICATIONS AT HOME:   Prior to Admission medications  Medication Sig Start Date End Date Taking? Authorizing Provider  amLODipine  (NORVASC ) 10 MG tablet Take 1 tablet (10 mg total) by mouth daily. 12/11/23   Bacigalupo, Angela M, MD  Ascorbic Acid (VITAMIN C PO) Take by mouth daily.    [provider]  atorvastatin  (LIPITOR) 80 MG tablet Take 1 tablet (80 mg total) by mouth daily. 12/11/23   Bacigalupo, Angela M, MD  fluticasone  (FLONASE ) 50 MCG/ACT nasal spray Place 2 sprays into both nostrils daily. 11/06/23   Bacigalupo, Angela M, MD  hydrochlorothiazide  (HYDRODIURIL ) 25 MG tablet Take 1 tablet (25 mg total) by mouth daily. 12/11/23   Bacigalupo, Angela M, MD  lidocaine  (XYLOCAINE ) 2 % solution Please specify directions, refills and quantity 03/13/20   Bacigalupo, Jon HERO, MD  lisinopril  (ZESTRIL ) 40 MG tablet Take 1 tablet (40 mg total) by mouth daily. 12/11/23   Bacigalupo, Angela M, MD  meclizine  (ANTIVERT ) 25 MG tablet Take 1 tablet (25 mg total) by mouth every 6 (six) hours as needed. 09/25/23   Myrla Jon HERO, MD  meclizine  (ANTIVERT ) 25 MG tablet Take 1 tablet (25 mg total) by mouth every 6 (six) hours as needed. 02/25/23   Myrla Jon HERO, MD  meloxicam  (MOBIC ) 15 MG tablet Take 1 tablet (15 mg total) by mouth daily. 09/24/23   Hyatt, Max T, DPM  Multiple Vitamin (MULTIVITAMIN) tablet Take 1 tablet by mouth daily.    [provider]  predniSONE  (DELTASONE ) 10 MG tablet Take 4 tablets (40 mg total) by mouth daily for 28 days. 01/28/24 02/27/24    Semaglutide , 2 MG/DOSE,  (OZEMPIC , 2 MG/DOSE,) 8 MG/3ML SOPN Inject 2 mg into the skin once a week. 12/11/23   Bacigalupo, Angela M, MD  sildenafil  (VIAGRA ) 100 MG tablet Take 1 tablet (100 mg total) by mouth daily as needed for erectile dysfunction. 09/09/23   Bacigalupo, Angela M, MD  tadalafil  (CIALIS ) 20 MG tablet Take 10 mg by mouth daily as needed. 09/13/23   [provider]      VITAL SIGNS:  Blood pressure 138/88, pulse 76, temperature (!) 97.5 F (36.4 C), temperature source Oral, resp. rate 18, height 6' 3 (1.905 m), weight 94.3 kg, SpO2 100%.  PHYSICAL EXAMINATION:  Physical Exam  GENERAL:  54 y.o.-year-old African-American male patient lying  in the bed with no acute distress.  EYES: Pupils equal, round, reactive to light and accommodation. No scleral icterus. Extraocular muscles intact.  HEENT: Head atraumatic, normocephalic. Oropharynx slightly dry mucous membrane and tongue and nasopharynx clear.  NECK:  Supple, no jugular venous distention. No thyroid enlargement, no tenderness.  LUNGS: Normal breath sounds bilaterally, no wheezing, rales,rhonchi or crepitation. No use of accessory muscles of respiration.  CARDIOVASCULAR: Regular rate and rhythm, S1, S2 normal. No murmurs, rubs, or gallops.  ABDOMEN: Soft, nondistended, nontender. Bowel sounds present. No organomegaly or mass.  EXTREMITIES: No pedal edema, cyanosis, or clubbing.  NEUROLOGIC: Cranial nerves II through XII are intact. Muscle strength 5/5 in all extremities. Sensation intact. Gait not checked.  PSYCHIATRIC: The patient is alert and oriented x 3.  Normal affect and good eye contact. SKIN: No obvious rash, lesion, or ulcer.   LABORATORY PANEL:   CBC Recent Labs  Lab 02/24/24 2042  WBC 16.2*  HGB 16.1  HCT 42.9  PLT 204   ------------------------------------------------------------------------------------------------------------------  Chemistries  Recent Labs  Lab 02/24/24 1623 02/24/24 2042 02/24/24 2350  NA  118*   < > 132*  K 6.0*   < > 4.0  CL 84*   < > 97*  CO2 18*   < > 22  GLUCOSE 842*   < > 190*  BUN 49*   < > 40*  CREATININE 1.94*   < > 1.62*  CALCIUM  9.5   < > 9.2  AST 44*  --   --   ALT 76*  --   --   ALKPHOS 217*  --   --   BILITOT 0.9  --   --    < > = values in this interval not displayed.   ------------------------------------------------------------------------------------------------------------------  Cardiac Enzymes No results for input(s): TROPONINI in the last 168 hours. ------------------------------------------------------------------------------------------------------------------  RADIOLOGY:  No results found.    IMPRESSION AND PLAN:  Assessment and Plan: * Type 2 diabetes mellitus with hyperosmolar nonketotic hyperglycemia (HCC) -The patient was initially expected to be admitted to stepdown unit bed on IV insulin  drip per Endo tool and after improvement of blood glucose measures he will be placed in observation on telemetry bed. - Will place the patient on highly resistant NovoLog  subcutaneous supplemental coverage. - Will keep him n.p.o. for now. - Will continue aggressive hydration and follow his hyponatremia and hypochloremia.  Hyperkalemia - The patient was given 1 g of IV calcium  gluconate and was placed on IV insulin  drip initially with improvement of his potassium.  Dyslipidemia - Will continue statin therapy.  Essential hypertension - Will continue antihypertensive therapy.   DVT prophylaxis: Lovenox . Advanced Care Planning:  Code Status: full code. Family Communication:  The plan of care was discussed in details with the patient (and family). I answered all questions. The patient agreed to proceed with the above mentioned plan. Further management will depend upon hospital course. Disposition Plan: Back to previous home environment Consults called: none. All the records are reviewed and case discussed with ED provider.  Status is:  Observation   I certify that at the time of admission, it is my clinical judgment that the patient will require inpatient hospital care extending more than 2 midnights.                            Dispo: The patient is from: Home              Anticipated  d/c is to: Home              Patient currently is not medically stable to d/c.              Difficult to place patient: No  Madison DELENA Peaches M.D on 02/25/2024 at 2:18 AM  [facility]   From 7 PM-7 AM, contact night-coverage [email or web address]  CC: Primary care physician; Myrla Jon HERO, MD     [1]  Allergies Allergen Reactions   Other Rash   Silicone Dermatitis   Tape Rash   "

## 2024-02-24 NOTE — ED Notes (Signed)
 Insuin drip discontinued per Endotool. MD updated

## 2024-02-25 ENCOUNTER — Telehealth: Payer: Self-pay | Admitting: Pharmacist

## 2024-02-25 ENCOUNTER — Other Ambulatory Visit: Payer: Self-pay

## 2024-02-25 DIAGNOSIS — R7989 Other specified abnormal findings of blood chemistry: Secondary | ICD-10-CM

## 2024-02-25 DIAGNOSIS — I10 Essential (primary) hypertension: Secondary | ICD-10-CM | POA: Insufficient documentation

## 2024-02-25 DIAGNOSIS — N179 Acute kidney failure, unspecified: Secondary | ICD-10-CM

## 2024-02-25 DIAGNOSIS — E875 Hyperkalemia: Principal | ICD-10-CM

## 2024-02-25 DIAGNOSIS — E860 Dehydration: Secondary | ICD-10-CM

## 2024-02-25 DIAGNOSIS — E785 Hyperlipidemia, unspecified: Secondary | ICD-10-CM | POA: Insufficient documentation

## 2024-02-25 LAB — BASIC METABOLIC PANEL WITH GFR
Anion gap: 10 (ref 5–15)
Anion gap: 10 (ref 5–15)
Anion gap: 12 (ref 5–15)
Anion gap: 13 (ref 5–15)
BUN: 29 mg/dL — ABNORMAL HIGH (ref 6–20)
BUN: 32 mg/dL — ABNORMAL HIGH (ref 6–20)
BUN: 34 mg/dL — ABNORMAL HIGH (ref 6–20)
BUN: 40 mg/dL — ABNORMAL HIGH (ref 6–20)
CO2: 22 mmol/L (ref 22–32)
CO2: 23 mmol/L (ref 22–32)
CO2: 26 mmol/L (ref 22–32)
CO2: 26 mmol/L (ref 22–32)
Calcium: 8.9 mg/dL (ref 8.9–10.3)
Calcium: 9.2 mg/dL (ref 8.9–10.3)
Calcium: 9.3 mg/dL (ref 8.9–10.3)
Calcium: 9.4 mg/dL (ref 8.9–10.3)
Chloride: 96 mmol/L — ABNORMAL LOW (ref 98–111)
Chloride: 97 mmol/L — ABNORMAL LOW (ref 98–111)
Chloride: 97 mmol/L — ABNORMAL LOW (ref 98–111)
Chloride: 97 mmol/L — ABNORMAL LOW (ref 98–111)
Creatinine, Ser: 1.39 mg/dL — ABNORMAL HIGH (ref 0.61–1.24)
Creatinine, Ser: 1.5 mg/dL — ABNORMAL HIGH (ref 0.61–1.24)
Creatinine, Ser: 1.55 mg/dL — ABNORMAL HIGH (ref 0.61–1.24)
Creatinine, Ser: 1.62 mg/dL — ABNORMAL HIGH (ref 0.61–1.24)
GFR, Estimated: 50 mL/min — ABNORMAL LOW
GFR, Estimated: 52 mL/min — ABNORMAL LOW
GFR, Estimated: 54 mL/min — ABNORMAL LOW
GFR, Estimated: 59 mL/min — ABNORMAL LOW
Glucose, Bld: 190 mg/dL — ABNORMAL HIGH (ref 70–99)
Glucose, Bld: 194 mg/dL — ABNORMAL HIGH (ref 70–99)
Glucose, Bld: 260 mg/dL — ABNORMAL HIGH (ref 70–99)
Glucose, Bld: 268 mg/dL — ABNORMAL HIGH (ref 70–99)
Potassium: 3.8 mmol/L (ref 3.5–5.1)
Potassium: 4 mmol/L (ref 3.5–5.1)
Potassium: 4.2 mmol/L (ref 3.5–5.1)
Potassium: 4.2 mmol/L (ref 3.5–5.1)
Sodium: 132 mmol/L — ABNORMAL LOW (ref 135–145)
Sodium: 132 mmol/L — ABNORMAL LOW (ref 135–145)
Sodium: 133 mmol/L — ABNORMAL LOW (ref 135–145)
Sodium: 133 mmol/L — ABNORMAL LOW (ref 135–145)

## 2024-02-25 LAB — BETA-HYDROXYBUTYRIC ACID
Beta-Hydroxybutyric Acid: 0.06 mmol/L (ref 0.05–0.27)
Beta-Hydroxybutyric Acid: 0.08 mmol/L (ref 0.05–0.27)
Beta-Hydroxybutyric Acid: 0.09 mmol/L (ref 0.05–0.27)
Beta-Hydroxybutyric Acid: 0.12 mmol/L (ref 0.05–0.27)

## 2024-02-25 LAB — HEPATIC FUNCTION PANEL
ALT: 65 U/L — ABNORMAL HIGH (ref 0–44)
AST: 45 U/L — ABNORMAL HIGH (ref 15–41)
Albumin: 3.9 g/dL (ref 3.5–5.0)
Alkaline Phosphatase: 185 U/L — ABNORMAL HIGH (ref 38–126)
Bilirubin, Direct: 0.5 mg/dL — ABNORMAL HIGH (ref 0.0–0.2)
Indirect Bilirubin: 0.5 mg/dL (ref 0.3–0.9)
Total Bilirubin: 1 mg/dL (ref 0.0–1.2)
Total Protein: 6.9 g/dL (ref 6.5–8.1)

## 2024-02-25 LAB — CBC
HCT: 40.5 % (ref 39.0–52.0)
Hemoglobin: 15.2 g/dL (ref 13.0–17.0)
MCH: 29.4 pg (ref 26.0–34.0)
MCHC: 37.5 g/dL — ABNORMAL HIGH (ref 30.0–36.0)
MCV: 78.3 fL — ABNORMAL LOW (ref 80.0–100.0)
Platelets: 180 10*3/uL (ref 150–400)
RBC: 5.17 MIL/uL (ref 4.22–5.81)
RDW: 12.9 % (ref 11.5–15.5)
WBC: 17.7 10*3/uL — ABNORMAL HIGH (ref 4.0–10.5)
nRBC: 0 % (ref 0.0–0.2)

## 2024-02-25 LAB — GLUCOSE, CAPILLARY
Glucose-Capillary: 304 mg/dL — ABNORMAL HIGH (ref 70–99)
Glucose-Capillary: 166 mg/dL — ABNORMAL HIGH (ref 70–99)

## 2024-02-25 LAB — CBG MONITORING, ED: Glucose-Capillary: 194 mg/dL — ABNORMAL HIGH (ref 70–99)

## 2024-02-25 MED ORDER — BLOOD GLUCOSE MONITOR SYSTEM W/DEVICE KIT
1.0000 | PACK | 0 refills | Status: AC
  Filled 2024-02-25: qty 1, 1d supply, fill #0

## 2024-02-25 MED ORDER — PREDNISONE 10 MG PO TABS: 20.0000 mg | ORAL_TABLET | Freq: Every day | ORAL | Status: AC

## 2024-02-25 MED ORDER — BLOOD GLUCOSE TEST VI STRP
1.0000 | ORAL_STRIP | 0 refills | Status: AC
  Filled 2024-02-25: qty 100, 25d supply, fill #0

## 2024-02-25 MED ORDER — LANCETS MISC
1.0000 | 0 refills | Status: AC
  Filled 2024-02-25: qty 100, 25d supply, fill #0

## 2024-02-25 MED ORDER — LANCET DEVICE MISC
1.0000 | 0 refills | Status: AC
  Filled 2024-02-25: qty 1, fill #0

## 2024-02-25 NOTE — Assessment & Plan Note (Signed)
-  The patient was initially expected to be admitted to stepdown unit bed on IV insulin  drip per Endo tool and after improvement of blood glucose measures he will be placed in observation on telemetry bed. - Will place the patient on highly resistant NovoLog  subcutaneous supplemental coverage. - Will keep him n.p.o. for now. - Will continue aggressive hydration and follow his hyponatremia and hypochloremia.

## 2024-02-25 NOTE — Plan of Care (Signed)
 Problem: Education: Goal: Ability to describe self-care measures that may prevent or decrease complications (Diabetes Survival Skills Education) will improve Outcome: Adequate for Discharge Goal: Individualized Educational Video(s) Outcome: Adequate for Discharge   Problem: Coping: Goal: Ability to adjust to condition or change in health will improve Outcome: Adequate for Discharge   Problem: Fluid Volume: Goal: Ability to maintain a balanced intake and output will improve Outcome: Adequate for Discharge   Problem: Health Behavior/Discharge Planning: Goal: Ability to identify and utilize available resources and services will improve Outcome: Adequate for Discharge Goal: Ability to manage health-related needs will improve Outcome: Adequate for Discharge   Problem: Metabolic: Goal: Ability to maintain appropriate glucose levels will improve Outcome: Adequate for Discharge   Problem: Nutritional: Goal: Maintenance of adequate nutrition will improve Outcome: Adequate for Discharge Goal: Progress toward achieving an optimal weight will improve Outcome: Adequate for Discharge   Problem: Skin Integrity: Goal: Risk for impaired skin integrity will decrease Outcome: Adequate for Discharge   Problem: Tissue Perfusion: Goal: Adequacy of tissue perfusion will improve Outcome: Adequate for Discharge   Problem: Education: Goal: Ability to describe self-care measures that may prevent or decrease complications (Diabetes Survival Skills Education) will improve Outcome: Adequate for Discharge Goal: Individualized Educational Video(s) Outcome: Adequate for Discharge   Problem: Cardiac: Goal: Ability to maintain an adequate cardiac output will improve Outcome: Adequate for Discharge   Problem: Health Behavior/Discharge Planning: Goal: Ability to identify and utilize available resources and services will improve Outcome: Adequate for Discharge Goal: Ability to manage health-related  needs will improve Outcome: Adequate for Discharge   Problem: Fluid Volume: Goal: Ability to achieve a balanced intake and output will improve Outcome: Adequate for Discharge   Problem: Metabolic: Goal: Ability to maintain appropriate glucose levels will improve Outcome: Adequate for Discharge   Problem: Nutritional: Goal: Maintenance of adequate nutrition will improve Outcome: Adequate for Discharge Goal: Maintenance of adequate weight for body size and type will improve Outcome: Adequate for Discharge   Problem: Respiratory: Goal: Will regain and/or maintain adequate ventilation Outcome: Adequate for Discharge   Problem: Urinary Elimination: Goal: Ability to achieve and maintain adequate renal perfusion and functioning will improve Outcome: Adequate for Discharge   Problem: Education: Goal: Knowledge of General Education information will improve Description: Including pain rating scale, medication(s)/side effects and non-pharmacologic comfort measures Outcome: Adequate for Discharge   Problem: Health Behavior/Discharge Planning: Goal: Ability to manage health-related needs will improve Outcome: Adequate for Discharge   Problem: Clinical Measurements: Goal: Ability to maintain clinical measurements within normal limits will improve Outcome: Adequate for Discharge Goal: Will remain free from infection Outcome: Adequate for Discharge Goal: Diagnostic test results will improve Outcome: Adequate for Discharge Goal: Respiratory complications will improve Outcome: Adequate for Discharge Goal: Cardiovascular complication will be avoided Outcome: Adequate for Discharge   Problem: Activity: Goal: Risk for activity intolerance will decrease Outcome: Adequate for Discharge   Problem: Nutrition: Goal: Adequate nutrition will be maintained Outcome: Adequate for Discharge   Problem: Coping: Goal: Level of anxiety will decrease Outcome: Adequate for Discharge   Problem:  Elimination: Goal: Will not experience complications related to bowel motility Outcome: Adequate for Discharge Goal: Will not experience complications related to urinary retention Outcome: Adequate for Discharge   Problem: Pain Managment: Goal: General experience of comfort will improve and/or be controlled Outcome: Adequate for Discharge   Problem: Safety: Goal: Ability to remain free from injury will improve Outcome: Adequate for Discharge   Problem: Skin Integrity: Goal: Risk for impaired skin  integrity will decrease Outcome: Adequate for Discharge

## 2024-02-25 NOTE — Inpatient Diabetes Management (Addendum)
 Inpatient Diabetes Program Recommendations  AACE/ADA: New Consensus Statement on Inpatient Glycemic Control (2015)  Target Ranges:  Prepandial:   less than 140 mg/dL      Peak postprandial:   less than 180 mg/dL (1-2 hours)      Critically ill patients:  140 - 180 mg/dL    Latest Reference Range & Units 02/24/24 16:23  Glucose 70 - 99 mg/dL 157 (HH)  (HH): Data is critically high  Latest Reference Range & Units 12/11/23 08:17  Hemoglobin A1C 4.0 - 5.6 % -  7.2 ! Pend  !: Data is abnormal  Latest Reference Range & Units 02/24/24 19:29 02/24/24 21:09 02/24/24 21:44 02/24/24 22:47 02/24/24 23:08 02/25/24 00:07 02/25/24 04:57  Glucose-Capillary 70 - 99 mg/dL >399 (HH)  9 units Novolog   475 (H)  IV Insulin  Drip Started 373 (H) 184 (H)  IV Insulin  Drip Stopped 172 (H)  4 units Novolog   194 (H) 304 (H)  15 units Novolog      Latest Reference Range & Units 02/25/24 08:17  Glucose-Capillary 70 - 99 mg/dL 833 (H)  (H): Data is abnormally high   Sent to ED from GI Office with Hyperglycemia (Glu 842--BHB WNL)/ AKI/ Hyperkalemia.  +DM2  Home DM Meds: Ozempic  2 mg Qwk  Current Orders: Novolog  Resistant Correction Scale/ SSI (0-20 units) Q4H    --Started IV Insulin  Drip last PM for a couple of hours with IVF resuscitation.    --Stopped IV Insulin  at 11pm and started Novolog  0-20 Q4H at MN  --No Lantus given before Drip Stopped  --CBG 166 at 8am today  --Will try to speak with pt today and determine cause of such extreme hyperglycemia on admission  Addendum 1:30pm--Went to speak w/ pt and he had already been discharged.      --Will follow patient during hospitalization--  Adina Rudolpho Arrow RN, MSN, CDCES Diabetes Coordinator Inpatient Glycemic Control Team Team Pager: [phone] (8a-5p)

## 2024-02-25 NOTE — Assessment & Plan Note (Signed)
 Will continue statin therapy

## 2024-02-25 NOTE — Assessment & Plan Note (Signed)
-   The patient was given 1 g of IV calcium  gluconate and was placed on IV insulin  drip initially with improvement of his potassium.

## 2024-02-25 NOTE — Assessment & Plan Note (Addendum)
-   Will continue antihypertensive therapy.

## 2024-02-25 NOTE — Discharge Summary (Signed)
 " Physician Discharge Summary  Patient: Timothy Douglas:[id number] DOB: Aug 01, 1970   Code Status: Full Code Admit date: 02/24/2024 Discharge date: 02/25/2024 Disposition: Home, No home health services recommended PCP: Myrla Jon HERO, MD  Recommendations for Outpatient Follow-up:  Follow up with PCP within 1-2 weeks Regarding general hospital follow up and preventative care Recommend reviewing his blood sugars if remaining on steroids long term and initiating insulin  as needed or other glycemic agents.  Follow up with GI tomorrow  Discharge Diagnoses:  Principal Problem:   Type 2 diabetes mellitus with hyperosmolar nonketotic hyperglycemia (HCC) Active Problems:   Hyperkalemia   Essential hypertension   Dyslipidemia   AKI (acute kidney injury)   Dehydration   Elevated LFTs  Brief Hospital Course Summary: Timothy Douglas is a 54 y.o. male with medical history significant for essential hypertension, type 2 diabetes mellitus, DJD. He presents with polyuria and polydipsia. 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    ED Course: BP was 150/87 with heart rate 102 with otherwise normal vital signs.  Labs revealed hyponatremia 118 and hypochloremia of 84 with blood glucose of 842, BUN of 49 and creatinine 1.94 above previous levels.  LFTs showed alk phos 217 AST 44 ALT 76, still better than in 11/25.  CBC showed leukocytosis of 14 with neutrophilia.  Serum osmolality with 315.  Beta-hydroxybutyrate was normal at 0.12 as well as anion gap. EKG: normal sinus rhythm with a rate of 96 with left posterior fascicle block and T wave inversion inferiorly. Imaging: None.   The patient was given 1 L bolus of IV normal saline and 9 units of IV insulin  as well as IV lactated ringer  bolus.  The patient was initially expected to be admitted to stepdown unit bed on IV insulin  drip per Endo tool but had rapid improvement and was changed to subcutaneous insulin   treatment.  His symptoms improved and blood sugars normalized while his steroid was held on admission.  Hepatic panel showed great improvement in his liver labs from treatment. AST 45, ALT 65, ALKphos 185. Total bili 1.0.  We discussed continuing insulin  after dc but opted to instead at this time to make strict diet restrictions and decrease his steroid dose to 20mg  daily at dc.  I prescribed a glucometer for blood sugar monitoring and discussed hyperglycemic symptoms.  He will continue his ozempic .   He has follow up with his hepatologist tomorrow.   All other chronic conditions were treated with home medications.    Discharge Condition: Good, improved Recommended discharge diet: Diabetic diet  Consultations: None   Procedures/Studies: None   Allergies as of 02/25/2024       Reactions   Other Rash   Silicone Dermatitis   Tape Rash        Medication List     STOP taking these medications    hydrochlorothiazide  25 MG tablet Commonly known as: HYDRODIURIL    meloxicam  15 MG tablet Commonly known as: MOBIC    multivitamin tablet   VITAMIN C PO       TAKE these medications    amLODipine  10 MG tablet Commonly known as: NORVASC  Take 1 tablet (10 mg total) by mouth daily.   atorvastatin  80 MG tablet Commonly known as: LIPITOR Take 1 tablet (80 mg total) by mouth daily.   fluticasone  50 MCG/ACT nasal spray Commonly known as: FLONASE  Place 2 sprays into both nostrils daily.   lidocaine  2 % solution Commonly known as: XYLOCAINE  Please specify  directions, refills and quantity   lisinopril  40 MG tablet Commonly known as: ZESTRIL  Take 1 tablet (40 mg total) by mouth daily.   meclizine  25 MG tablet Commonly known as: ANTIVERT  Take 1 tablet (25 mg total) by mouth every 6 (six) hours as needed.   Ozempic  (2 MG/DOSE) 8 MG/3ML Sopn Generic drug: Semaglutide  (2 MG/DOSE) Inject 2 mg into the skin once a week.   predniSONE  10 MG tablet Commonly known as:  DELTASONE  Take 2 tablets (20 mg total) by mouth daily for 5 days. What changed: how much to take   sildenafil  100 MG tablet Commonly known as: Viagra  Take 1 tablet (100 mg total) by mouth daily as needed for erectile dysfunction.   tadalafil  20 MG tablet Commonly known as: CIALIS  Take 10 mg by mouth daily as needed.               Durable Medical Equipment  (From admission, onward)           Start     Ordered   02/25/24 1039  DME Glucometer  Once        02/25/24 1038            Follow-up Information     Bacigalupo, Jon HERO, MD. Schedule an appointment as soon as possible for a visit in 1 week(s).   Specialty: Family Medicine Contact information: [address] [place] [address] [phone]         Therisa Bi, MD. Go in 1 day(s).   Specialty: Gastroenterology Contact information: [address] [place] [address] [phone]                 Subjective   Pt reports feeling well. Denies N/V/abdominal pain. He is tolerating normal diet. His polydipsia and polyuria have improved.  Prefers to not start insulin .   All questions and concerns were addressed at time of discharge.  Objective  Blood pressure (!) 118/91, pulse 80, temperature 98 F (36.7 C), resp. rate 16, height 6' 3 (1.905 m), weight 94.3 kg, SpO2 100%.   General: Pt is alert, awake, not in acute distress Cardiovascular: RRR, S1/S2 +, no rubs, no gallops Respiratory: CTA bilaterally, no wheezing, no rhonchi Abdominal: Soft, NT, ND, bowel sounds + Extremities: no edema, no cyanosis  The results of significant diagnostics from this hospitalization (including imaging, microbiology, ancillary and laboratory) are listed below for reference.   Imaging studies: No results found.  Labs: Basic Metabolic Panel: Recent Labs  Lab 02/24/24 1623 02/24/24 2042 02/24/24 2350 02/25/24 0607 02/25/24 0736  NA 118* 125* 132* 132* 133*  K 6.0* 5.0 4.0 4.2 4.2   CL 84* 91* 97* 96* 97*  CO2 18* 19* 22 26 26   GLUCOSE 842* 458* 190* 260* 194*  BUN 49* 46* 40* 34* 32*  CREATININE 1.94* 1.93* 1.62* 1.55* 1.50*  CALCIUM  9.5 9.3 9.2 9.3 9.4   CBC: Recent Labs  Lab 02/24/24 1623 02/24/24 2042 02/25/24 0736  WBC 14.0* 16.2* 17.7*  NEUTROABS 11.6*  --   --   HGB 15.8 16.1 15.2  HCT 43.3 42.9 40.5  MCV 79.2* 78.3* 78.3*  PLT 207 204 180   Microbiology: Results for orders placed or performed during the hospital encounter of 03/17/19  SARS CORONAVIRUS 2 (TAT 6-24 HRS) Nasopharyngeal Nasopharyngeal Swab     Status: None   Collection Time: 03/17/19 11:04 AM   Specimen: Nasopharyngeal Swab  Result Value Ref Range Status   SARS Coronavirus 2 NEGATIVE NEGATIVE  Final    Comment: (NOTE) SARS-CoV-2 target nucleic acids are NOT DETECTED. The SARS-CoV-2 RNA is generally detectable in upper and lower respiratory specimens during the acute phase of infection. Negative results do not preclude SARS-CoV-2 infection, do not rule out co-infections with other pathogens, and should not be used as the sole basis for treatment or other patient management decisions. Negative results must be combined with clinical observations, patient history, and epidemiological information. The expected result is Negative. Fact Sheet for Patients: hairslick.no Fact Sheet for Healthcare Providers: quierodirigir.com This test is not yet approved or cleared by the [place]  FDA and  has been authorized for detection and/or diagnosis of SARS-CoV-2 by FDA under an Emergency Use Authorization (EUA). This EUA will remain  in effect (meaning this test can be used) for the duration of the COVID-19 declaration under Section 56 4(b)(1) of the Act, 21 U.S.C. section 360bbb-3(b)(1), unless the authorization is terminated or revoked sooner. Performed at [facility], [address], [place], [place] [address]     Time  coordinating discharge: Over 30 minutes  Marien LITTIE Piety, MD  [facility] 02/25/2024, 10:50 AM  "

## 2024-02-25 NOTE — TOC CM/SW Note (Signed)
 Transition of Care [facility]) - Inpatient Brief Assessment   Patient Details  Name: Timothy Douglas MRN: [id number] Date of Birth: 1970-10-12  [facility] of [facility]) CM/SW Contact:    Corean ONEIDA Haddock, RN Phone Number: 02/25/2024, 11:07 AM   Clinical Narrative:  Transition of Care Department [facility]) has reviewed patient and no TOC needs have been identified at this time.  If new patient transition needs arise, please place a TOC consult.  Consult noted for HH/DME.  Per MD consult was placed due to order for glucometer.  I notified MD that glucometers are filled by pharmacy   Transition of Care Asessment: Insurance and Status: Insurance coverage has been reviewed Patient has primary care physician: Yes     Prior/Current Home Services: No current home services Social Drivers of Health Review: SDOH reviewed no interventions necessary Readmission risk has been reviewed: Yes Transition of care needs: no transition of care needs at this time

## 2024-02-25 NOTE — Plan of Care (Signed)
" °  Problem: Coping: Goal: Ability to adjust to condition or change in health will improve Outcome: Progressing   Problem: Fluid Volume: Goal: Ability to maintain a balanced intake and output will improve Outcome: Progressing   Problem: Health Behavior/Discharge Planning: Goal: Ability to identify and utilize available resources and services will improve Outcome: Progressing Goal: Ability to manage health-related needs will improve Outcome: Progressing   Problem: Metabolic: Goal: Ability to maintain appropriate glucose levels will improve Outcome: Progressing   Problem: Nutritional: Goal: Maintenance of adequate nutrition will improve Outcome: Progressing Goal: Progress toward achieving an optimal weight will improve Outcome: Progressing   Problem: Skin Integrity: Goal: Risk for impaired skin integrity will decrease Outcome: Progressing   Problem: Tissue Perfusion: Goal: Adequacy of tissue perfusion will improve Outcome: Progressing   Problem: Cardiac: Goal: Ability to maintain an adequate cardiac output will improve Outcome: Progressing   Problem: Health Behavior/Discharge Planning: Goal: Ability to identify and utilize available resources and services will improve Outcome: Progressing Goal: Ability to manage health-related needs will improve Outcome: Progressing   Problem: Metabolic: Goal: Ability to maintain appropriate glucose levels will improve Outcome: Progressing   Problem: Nutritional: Goal: Maintenance of adequate nutrition will improve Outcome: Progressing Goal: Maintenance of adequate weight for body size and type will improve Outcome: Progressing   Problem: Respiratory: Goal: Will regain and/or maintain adequate ventilation Outcome: Progressing   Problem: Urinary Elimination: Goal: Ability to achieve and maintain adequate renal perfusion and functioning will improve Outcome: Progressing   Problem: Education: Goal: Knowledge of General Education  information will improve Description: Including pain rating scale, medication(s)/side effects and non-pharmacologic comfort measures Outcome: Progressing   Problem: Health Behavior/Discharge Planning: Goal: Ability to manage health-related needs will improve Outcome: Progressing   Problem: Clinical Measurements: Goal: Ability to maintain clinical measurements within normal limits will improve Outcome: Progressing Goal: Will remain free from infection Outcome: Progressing Goal: Diagnostic test results will improve Outcome: Progressing Goal: Respiratory complications will improve Outcome: Progressing Goal: Cardiovascular complication will be avoided Outcome: Progressing   Problem: Activity: Goal: Risk for activity intolerance will decrease Outcome: Progressing   Problem: Nutrition: Goal: Adequate nutrition will be maintained Outcome: Progressing   Problem: Coping: Goal: Level of anxiety will decrease Outcome: Progressing   Problem: Elimination: Goal: Will not experience complications related to bowel motility Outcome: Progressing Goal: Will not experience complications related to urinary retention Outcome: Progressing   Problem: Pain Managment: Goal: General experience of comfort will improve and/or be controlled Outcome: Progressing   Problem: Skin Integrity: Goal: Risk for impaired skin integrity will decrease Outcome: Progressing   "

## 2024-02-25 NOTE — Discharge Instructions (Signed)
 HAPPY BIRTHDAY! Unfortunately, I don't want you eating any cake today. Since your blood sugars are still at risk of jumping up again while you are on steroids, you need to severely limit your sugar and carbohydrates (including sweet fruits and juices/sodas/teas) while you are on the steroid.  I have decreased your remaining dose of prednisone  down to 20mg  daily. Your liver labs are greatly improved and can't stop the steroids cold turkey. So I recommend monitoring your symptoms for elevated blood sugars and getting a glucometer to monitor.  I ordered a glucometer and if not able to cover with insurance, there are options available over the counter from pharmacies I would recommend getting.  Information included here on diet recommendations and glucose monitoring, good all the time but especially while on the steroid.  Please follow up with your hepatologist appointment tomorrow to discuss tapering off the steroids.

## 2024-02-25 NOTE — Progress Notes (Signed)
 SPIRITUAL CARE AND COUNSELING CONSULT NOTE   VISIT SUMMARY Chaplain On-Call responded to Spiritual Care Consult Order from Madison Peaches, MD. The request was for Advance Directives information for the patient. Chaplain provided the AD documents and education. The patient stated his understanding.   SPIRITUAL ENCOUNTER                                                                                                                                                                      Type of Visit: Initial Care provided to:: Pt and family Referral source: Physician (Spiritual Care Consult Order from Madison Peaches, MD) Reason for visit: Advance directives OnCall Visit: Yes   SPIRITUAL FRAMEWORK  Presenting Themes: Coping tools Patient Stress Factors: Health changes   GOALS       INTERVENTIONS   Spiritual Care Interventions Made: Decision-making support/facilitation    INTERVENTION OUTCOMES      SPIRITUAL CARE PLAN        If immediate needs arise, please contact [facility] 24 hour on call [phone]   Carlin KATHEE Lay, Chaplain  02/25/2024 11:29 AM

## 2024-02-26 ENCOUNTER — Telehealth: Payer: Self-pay

## 2024-02-26 ENCOUNTER — Other Ambulatory Visit (HOSPITAL_COMMUNITY): Payer: Self-pay

## 2024-02-26 ENCOUNTER — Other Ambulatory Visit: Payer: Self-pay

## 2024-02-26 NOTE — Telephone Encounter (Signed)
 PA request has been Received. New Encounter has been or will be created for follow up. For additional info see Pharmacy Prior Auth telephone encounter from 02/26/24.

## 2024-02-26 NOTE — Transitions of Care (Post Inpatient/ED Visit) (Signed)
 "  02/26/2024  Name: Timothy Douglas MRN: [id number] DOB: [birth year]  Today's TOC FU Call Status: Today's TOC FU Call Status:: Successful TOC FU Call Completed TOC FU Call Complete Date: 02/26/24  Patient's Name and Date of Birth confirmed. Name, DOB  Transition Care Management Follow-up Telephone Call Date of Discharge: 02/25/24 Discharge Facility: [facility]) Type of Discharge: Inpatient Admission Primary Inpatient Discharge Diagnosis:: AKF How have you been since you were released from the hospital?: Better Any questions or concerns?: No  Items Reviewed: Did you receive and understand the discharge instructions provided?: Yes Medications obtained,verified, and reconciled?: Yes (Medications Reviewed) Any new allergies since your discharge?: No Dietary orders reviewed?: Yes Do you have support at home?: Yes People in Home [RPT]: friend(s)  Medications Reviewed Today: Medications Reviewed Today     Reviewed by Emmitt Pan, LPN (Licensed Practical Nurse) on 02/26/24 at 1124  Med List Status: <None>   Medication Order Taking? Sig Documenting Provider Last Dose Status Informant  amLODipine  (NORVASC ) 10 MG tablet [id number] Yes Take 1 tablet (10 mg total) by mouth daily. Myrla Jon HERO, MD  Active Self  atorvastatin  (LIPITOR) 80 MG tablet [id number] Yes Take 1 tablet (80 mg total) by mouth daily. Myrla Jon HERO, MD  Active Self  Blood Glucose Monitoring Suppl (BLOOD GLUCOSE MONITOR SYSTEM) w/Device KIT [id number] Yes Use up to four times daily as directed. (FOR ICD-10 E10.9, E11.9). Lenon Marien CROME, MD  Active   fluticasone  (FLONASE ) 50 MCG/ACT nasal spray [id number] Yes Place 2 sprays into both nostrils daily. Myrla Jon HERO, MD  Active Self  Glucose Blood (BLOOD GLUCOSE TEST STRIPS) STRP [id number] Yes Use up to four times daily as directed. (FOR ICD-10 E10.9, E11.9). Lenon Marien CROME, MD  Active   Lancet Device MISC [id number]  Yes 1 each by Does not apply route as directed. Dispense based on patient and insurance preference. Use up to four times daily as directed. (FOR ICD-10 E10.9, E11.9). Lenon Marien CROME, MD  Active   Lancets MISC [id number] Yes Use up to four times daily as directed. (FOR ICD-10 E10.9, E11.9). Lenon Marien CROME, MD  Active   lidocaine  (XYLOCAINE ) 2 % solution [id number] Yes Please specify directions, refills and quantity Myrla Jon HERO, MD  Active Self  lisinopril  (ZESTRIL ) 40 MG tablet [id number] Yes Take 1 tablet (40 mg total) by mouth daily. Myrla Jon HERO, MD  Active Self  meclizine  (ANTIVERT ) 25 MG tablet [id number] Yes Take 1 tablet (25 mg total) by mouth every 6 (six) hours as needed. Myrla Jon HERO, MD  Active Self  predniSONE  (DELTASONE ) 10 MG tablet [id number] Yes Take 2 tablets (20 mg total) by mouth daily for 5 days. Lenon Marien CROME, MD  Active   Semaglutide , 2 MG/DOSE, (OZEMPIC , 2 MG/DOSE,) 8 MG/3ML SOPN [id number] Yes Inject 2 mg into the skin once a week. Bacigalupo, Angela M, MD  Active Self  sildenafil  (VIAGRA ) 100 MG tablet [id number] Yes Take 1 tablet (100 mg total) by mouth daily as needed for erectile dysfunction. Bacigalupo, Angela M, MD  Active Self  tadalafil  (CIALIS ) 20 MG tablet [id number] Yes Take 10 mg by mouth daily as needed. [provider]  Active Self            Home Care and Equipment/Supplies: Were Home Health Services Ordered?: NA Any new equipment or medical supplies ordered?: NA  Functional Questionnaire: Do you need assistance with bathing/showering or dressing?: No Do you need assistance with meal preparation?: No Do you  need assistance with eating?: No Do you have difficulty maintaining continence: No Do you need assistance with getting out of bed/getting out of a chair/moving?: No Do you have difficulty managing or taking your medications?: No  Follow up appointments reviewed: PCP Follow-up appointment confirmed?:  Yes Date of PCP follow-up appointment?: 03/03/24 Follow-up Provider: [facility] Follow-up appointment confirmed?: Yes Date of Specialist follow-up appointment?: 02/26/24 Follow-Up Specialty Provider:: GI Do you need transportation to your follow-up appointment?: No Do you understand care options if your condition(s) worsen?: Yes-patient verbalized understanding    SIGNATURE Julian Lemmings, LPN [facility] Nurse Health Advisor Direct Dial [phone]  "

## 2024-02-26 NOTE — Telephone Encounter (Signed)
 Pharmacy Patient Advocate Encounter  Received notification from [employer] that Prior Authorization for Ozempic  (2 MG/DOSE) 8MG /3ML pen-injectors  has been APPROVED from 02/26/24 to 02/25/25. Ran test claim, Copay is $25 with copay card. This test claim was processed through [facility]- copay amounts may vary at other pharmacies due to pharmacy/plan contracts, or as the patient moves through the different stages of their insurance plan.   PA #/Case ID/Reference #: [id number]

## 2024-02-26 NOTE — Telephone Encounter (Signed)
 Pharmacy Patient Advocate Encounter   Received notification from Pt Calls Messages that prior authorization for Ozempic  (2 MG/DOSE) 8MG /3ML pen-injectors  is required/requested.   Insurance verification completed.   The patient is insured through [facility].   Per test claim: PA required; PA submitted to above mentioned insurance via Latent Key/confirmation #/EOC [id number] Status is pending

## 2024-02-27 ENCOUNTER — Telehealth: Payer: Self-pay

## 2024-02-27 NOTE — Telephone Encounter (Signed)
 Copied from CRM #[id number]. Topic: Clinical - Medication Prior Auth >> Feb 27, 2024  2:01 PM Harlene ORN wrote: Reason for CRM: [place] [facility] [place] [facility]  Ozempic  has been approved. Here is the approval number: [id number]  Phone: [phone]

## 2024-03-02 ENCOUNTER — Other Ambulatory Visit: Payer: Self-pay

## 2024-03-02 MED ORDER — PREDNISONE 5 MG PO TABS
ORAL_TABLET | ORAL | 0 refills | Status: AC
  Filled 2024-03-02: qty 72, 30d supply, fill #0

## 2024-03-02 MED ORDER — AZATHIOPRINE 50 MG PO TABS
100.0000 mg | ORAL_TABLET | Freq: Every day | ORAL | 3 refills | Status: AC
  Filled 2024-03-02: qty 180, 90d supply, fill #0

## 2024-03-03 ENCOUNTER — Other Ambulatory Visit: Payer: Self-pay

## 2024-03-03 ENCOUNTER — Ambulatory Visit (INDEPENDENT_AMBULATORY_CARE_PROVIDER_SITE_OTHER): Admitting: Family Medicine

## 2024-03-03 ENCOUNTER — Encounter: Payer: Self-pay | Admitting: Family Medicine

## 2024-03-03 VITALS — BP 122/74 | HR 92 | Resp 16 | Ht 75.0 in | Wt 204.0 lb

## 2024-03-03 DIAGNOSIS — E1165 Type 2 diabetes mellitus with hyperglycemia: Secondary | ICD-10-CM

## 2024-03-03 DIAGNOSIS — N179 Acute kidney failure, unspecified: Secondary | ICD-10-CM

## 2024-03-03 DIAGNOSIS — K754 Autoimmune hepatitis: Secondary | ICD-10-CM

## 2024-03-03 DIAGNOSIS — E870 Hyperosmolality and hypernatremia: Secondary | ICD-10-CM

## 2024-03-03 MED ORDER — GLIPIZIDE 2.5 MG PO TABS
2.5000 mg | ORAL_TABLET | Freq: Every day | ORAL | 3 refills | Status: AC
  Filled 2024-03-03: qty 90, 45d supply, fill #0

## 2024-03-03 NOTE — Progress Notes (Signed)
 "     Established patient visit   Patient: Timothy Douglas   DOB: Jul 19, 1970   54 y.o. Male  MRN: [id number] Visit Date: 03/03/2024  Today's healthcare provider: Nancyann Perry, MD   Chief Complaint  Patient presents with   Hospitalization Follow-up    Hosp f/u.[facility] No other concerns   Subjective    Discussed the use of AI scribe software for clinical note transcription with the patient, who gave verbal consent to proceed.  History of Present Illness   Timothy Douglas is a 54 year old male with autoimmune hepatitis who presents for hospital follow-up after an episode of hyperglycemic hyponatremia.   He was admitted to the hospital on January 27th and discharged on January 28th due to polydipsia and polyuria while on a high dose of prednisone  for autoimmune hepatitis. During his hospital stay, he was found to be hyponatremic with a sodium level of 118, chloride of 84, a glucose level of 842, and a creatinine level of 1.94. He received IV fluids and insulin , which led to rapid improvement. His blood sugars and electrolytes normalized, and his prednisone  was resumed at a lower dose of 20 mg at discharge.  Currently, he is taking 15 mg of prednisone . His blood sugar remains slightly elevated, with a reading of 302 this morning. He has been on prednisone  for almost three weeks before the hospital admission, initially at 40 mg, which was then reduced to 20 mg and now down to 15 mg. He describes experiencing increased thirst and frequent urination, up to three to four times an hour, after the second week of prednisone  use. He continued taking the medication until a follow-up blood work indicated critically high blood sugar levels, prompting an ER visit. since discharge he has had follow up with Dr. Therisa and prescribed azothrioprine which he has not yet started.   He has not had any labs rechecked since hospital discharge but is scheduled for lab work on Friday morning via Dr. Therisa. He had a  virtual visit yesterday and another one is planned for tomorrow.  He has been taking Ozempic  for about a year, currently on a 2.0 mg dosage. He previously took glipizide  and metformin  before being switched to Ozempic  when his blood sugar levels were stable. 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      Medications: Show/hide medication list[1] Review of Systems     Objective    BP 122/74 (BP Location: Left Arm, Patient Position: Sitting)   Pulse 92   Resp 16   Ht 6' 3 (1.905 m)   Wt 204 lb (92.5 kg)   SpO2 98%   BMI 25.50 kg/m   Physical Exam   General: Appearance:    Well developed, well nourished male in no acute distress  Eyes:    PERRL, conjunctiva/corneas clear, EOM's intact       Lungs:     Clear to auscultation bilaterally, respirations unlabored  Heart:    Normal heart rate. Normal rhythm. No murmurs, rubs, or gallops.    MS:   All extremities are intact.    Neurologic:   Awake, alert, oriented x 3. No apparent focal neurological defect.        Assessment & Plan        Type 2 diabetes mellitus with hyperosmolar hyperglycemic state Recent hospitalization due to hyperosmolar hyperglycemic state from high-dose prednisone . Blood glucose remains elevated. Insulin  use concerns due to CDL  status. Glipizide  considered as alternative. - Prescribed glipizide  2.5 mg daily, increase to 5 mg if glucose >300 mg/dL. - Monitor blood glucose regularly. He is having labs drawn Friday ordered by Dr. Therisa. Advised to make sure he has kidney functions and electrolytes checked.   Autoimmune hepatitis Managed with prednisone , reduced from 40 mg to 15 mg. Concerns about azathioprine  side effects and interaction with lisinopril . Monitoring for pancreatitis symptoms emphasized. Was recently prescribed by Dr. Therisa but patient has not yet started.  - Continue azathioprine , monitor for pancreatitis symptoms. - Monitor blood  pressure due to lisinopril  interaction. - Follow-up with hepatologist for liver function and medication management.  Chronic kidney disease stage 3a Recent acute kidney injury likely from hyperosmolar hyperglycemic state. Current kidney function and electrolytes require monitoring. - Check kidney function and electrolytes during lab work. - He is to  kidney function monitoring with hepatologist during virtual visit.  Essential hypertension Managed with lisinopril . Potential interaction with azathioprine  may affect blood pressure control. - Monitor blood pressure regularly.     Return in about 1 week (around 03/10/2024). With PCP     Nancyann Perry, MD  [facility] [phone] (phone) [phone] (fax)  [facility]    [1]  Outpatient Medications Prior to Visit  Medication Sig   amLODipine  (NORVASC ) 10 MG tablet Take 1 tablet (10 mg total) by mouth daily.   atorvastatin  (LIPITOR) 80 MG tablet Take 1 tablet (80 mg total) by mouth daily.   Blood Glucose Monitoring Suppl (BLOOD GLUCOSE MONITOR SYSTEM) w/Device KIT Use up to four times daily as directed. (FOR ICD-10 E10.9, E11.9).   fluticasone  (FLONASE ) 50 MCG/ACT nasal spray Place 2 sprays into both nostrils daily.   Glucose Blood (BLOOD GLUCOSE TEST STRIPS) STRP Use up to four times daily as directed. (FOR ICD-10 E10.9, E11.9).   Lancet Device MISC 1 each by Does not apply route as directed. Dispense based on patient and insurance preference. Use up to four times daily as directed. (FOR ICD-10 E10.9, E11.9).   Lancets MISC Use up to four times daily as directed. (FOR ICD-10 E10.9, E11.9).   lidocaine  (XYLOCAINE ) 2 % solution Please specify directions, refills and quantity   lisinopril  (ZESTRIL ) 40 MG tablet Take 1 tablet (40 mg total) by mouth daily.   meclizine  (ANTIVERT ) 25 MG tablet Take 1 tablet (25 mg total) by mouth every 6 (six) hours as needed.   predniSONE  (DELTASONE ) 5 MG tablet 3  tablets (15 mg total) daily for 14 days, THEN 2 tablets (10 mg total) daily for 14 days, THEN 1 tablet (5 mg total) daily.   Semaglutide , 2 MG/DOSE, (OZEMPIC , 2 MG/DOSE,) 8 MG/3ML SOPN Inject 2 mg into the skin once a week.   sildenafil  (VIAGRA ) 100 MG tablet Take 1 tablet (100 mg total) by mouth daily as needed for erectile dysfunction.   tadalafil  (CIALIS ) 20 MG tablet Take 10 mg by mouth daily as needed.   azaTHIOprine  (IMURAN ) 50 MG tablet Take 2 tablets (100 mg total) by mouth daily. (Patient not taking: Reported on 03/03/2024)   No facility-administered medications prior to visit.   "

## 2024-03-03 NOTE — Patient Instructions (Signed)
 [facility]  Please review the attached list of medications and notify my office if there are any errors.   . Please bring all of your medications to every appointment so we can make sure that our medication list is the same as yours.

## 2024-03-11 ENCOUNTER — Ambulatory Visit: Admitting: Family Medicine
